# Patient Record
Sex: Female | Born: 1940 | Race: Black or African American | Hispanic: No | Marital: Married | Smoking: Former smoker
Health system: Southern US, Community
[De-identification: ages and names within clinical notes are randomized; demographics above are authoritative.]

## PROBLEM LIST (undated history)

## (undated) DIAGNOSIS — J189 Pneumonia, unspecified organism: Secondary | ICD-10-CM

## (undated) DIAGNOSIS — I5032 Chronic diastolic (congestive) heart failure: Secondary | ICD-10-CM

## (undated) DIAGNOSIS — I10 Essential (primary) hypertension: Secondary | ICD-10-CM

## (undated) DIAGNOSIS — I209 Angina pectoris, unspecified: Secondary | ICD-10-CM

## (undated) DIAGNOSIS — E039 Hypothyroidism, unspecified: Secondary | ICD-10-CM

## (undated) DIAGNOSIS — N186 End stage renal disease: Secondary | ICD-10-CM

## (undated) DIAGNOSIS — E785 Hyperlipidemia, unspecified: Secondary | ICD-10-CM

## (undated) DIAGNOSIS — Z992 Dependence on renal dialysis: Secondary | ICD-10-CM

## (undated) DIAGNOSIS — E079 Disorder of thyroid, unspecified: Secondary | ICD-10-CM

## (undated) DIAGNOSIS — D693 Immune thrombocytopenic purpura: Secondary | ICD-10-CM

## (undated) DIAGNOSIS — J45909 Unspecified asthma, uncomplicated: Secondary | ICD-10-CM

## (undated) DIAGNOSIS — J449 Chronic obstructive pulmonary disease, unspecified: Secondary | ICD-10-CM

## (undated) DIAGNOSIS — R0602 Shortness of breath: Secondary | ICD-10-CM

## (undated) DIAGNOSIS — C801 Malignant (primary) neoplasm, unspecified: Secondary | ICD-10-CM

## (undated) DIAGNOSIS — M1990 Unspecified osteoarthritis, unspecified site: Secondary | ICD-10-CM

## (undated) DIAGNOSIS — K219 Gastro-esophageal reflux disease without esophagitis: Secondary | ICD-10-CM

## (undated) DIAGNOSIS — K5792 Diverticulitis of intestine, part unspecified, without perforation or abscess without bleeding: Secondary | ICD-10-CM

## (undated) HISTORY — DX: Essential (primary) hypertension: I10

## (undated) HISTORY — DX: Hyperlipidemia, unspecified: E78.5

## (undated) HISTORY — PX: COLON SURGERY: SHX602

## (undated) HISTORY — PX: ABDOMINAL HYSTERECTOMY: SHX81

## (undated) HISTORY — DX: Disorder of thyroid, unspecified: E07.9

## (undated) HISTORY — DX: Chronic obstructive pulmonary disease, unspecified: J44.9

## (undated) HISTORY — DX: Immune thrombocytopenic purpura: D69.3

---

## 1998-04-21 ENCOUNTER — Ambulatory Visit (HOSPITAL_COMMUNITY): Admission: RE | Admit: 1998-04-21 | Discharge: 1998-04-21 | Payer: Self-pay | Admitting: Hematology & Oncology

## 1998-05-01 ENCOUNTER — Ambulatory Visit (HOSPITAL_COMMUNITY): Admission: RE | Admit: 1998-05-01 | Discharge: 1998-05-01 | Payer: Self-pay | Admitting: Hematology & Oncology

## 1998-05-13 ENCOUNTER — Ambulatory Visit (HOSPITAL_COMMUNITY): Admission: RE | Admit: 1998-05-13 | Discharge: 1998-05-13 | Payer: Self-pay | Admitting: General Surgery

## 1998-11-12 ENCOUNTER — Ambulatory Visit (HOSPITAL_COMMUNITY): Admission: RE | Admit: 1998-11-12 | Discharge: 1998-11-12 | Payer: Self-pay

## 1999-05-11 ENCOUNTER — Ambulatory Visit (HOSPITAL_COMMUNITY): Admission: RE | Admit: 1999-05-11 | Discharge: 1999-05-11 | Payer: Self-pay

## 1999-05-11 ENCOUNTER — Encounter: Payer: Self-pay | Admitting: Hematology & Oncology

## 2000-08-15 ENCOUNTER — Encounter: Payer: Self-pay | Admitting: Hematology & Oncology

## 2000-08-15 ENCOUNTER — Ambulatory Visit (HOSPITAL_COMMUNITY): Admission: RE | Admit: 2000-08-15 | Discharge: 2000-08-15 | Payer: Self-pay | Admitting: Hematology & Oncology

## 2001-03-21 ENCOUNTER — Emergency Department (HOSPITAL_COMMUNITY): Admission: EM | Admit: 2001-03-21 | Discharge: 2001-03-21 | Payer: Self-pay | Admitting: Emergency Medicine

## 2001-03-29 ENCOUNTER — Encounter (HOSPITAL_COMMUNITY): Admission: RE | Admit: 2001-03-29 | Discharge: 2001-06-27 | Payer: Self-pay | Admitting: *Deleted

## 2001-08-07 ENCOUNTER — Inpatient Hospital Stay (HOSPITAL_COMMUNITY): Admission: AD | Admit: 2001-08-07 | Discharge: 2001-08-14 | Payer: Self-pay | Admitting: Hematology & Oncology

## 2001-08-07 ENCOUNTER — Encounter: Payer: Self-pay | Admitting: Nephrology

## 2001-08-09 ENCOUNTER — Encounter: Payer: Self-pay | Admitting: *Deleted

## 2001-08-10 ENCOUNTER — Encounter: Payer: Self-pay | Admitting: Hematology & Oncology

## 2001-08-11 ENCOUNTER — Encounter: Payer: Self-pay | Admitting: Hematology & Oncology

## 2002-02-21 ENCOUNTER — Ambulatory Visit (HOSPITAL_COMMUNITY): Admission: RE | Admit: 2002-02-21 | Discharge: 2002-02-21 | Payer: Self-pay | Admitting: Vascular Surgery

## 2003-07-12 ENCOUNTER — Encounter: Payer: Self-pay | Admitting: Nephrology

## 2003-07-12 ENCOUNTER — Ambulatory Visit (HOSPITAL_COMMUNITY): Admission: RE | Admit: 2003-07-12 | Discharge: 2003-07-12 | Payer: Self-pay | Admitting: Nephrology

## 2003-07-17 ENCOUNTER — Encounter: Payer: Self-pay | Admitting: Nephrology

## 2003-07-17 ENCOUNTER — Encounter: Admission: RE | Admit: 2003-07-17 | Discharge: 2003-07-17 | Payer: Self-pay | Admitting: Nephrology

## 2003-08-21 ENCOUNTER — Other Ambulatory Visit: Admission: RE | Admit: 2003-08-21 | Discharge: 2003-08-21 | Payer: Self-pay | Admitting: Obstetrics and Gynecology

## 2003-09-11 ENCOUNTER — Ambulatory Visit (HOSPITAL_COMMUNITY): Admission: RE | Admit: 2003-09-11 | Discharge: 2003-09-11 | Payer: Self-pay | Admitting: *Deleted

## 2003-09-11 ENCOUNTER — Encounter (INDEPENDENT_AMBULATORY_CARE_PROVIDER_SITE_OTHER): Payer: Self-pay | Admitting: *Deleted

## 2004-11-06 ENCOUNTER — Ambulatory Visit (HOSPITAL_COMMUNITY): Admission: RE | Admit: 2004-11-06 | Discharge: 2004-11-06 | Payer: Self-pay | Admitting: Nephrology

## 2004-11-09 ENCOUNTER — Ambulatory Visit (HOSPITAL_COMMUNITY): Admission: RE | Admit: 2004-11-09 | Discharge: 2004-11-09 | Payer: Self-pay | Admitting: Nephrology

## 2004-12-26 ENCOUNTER — Ambulatory Visit (HOSPITAL_COMMUNITY): Admission: RE | Admit: 2004-12-26 | Discharge: 2004-12-26 | Payer: Self-pay | Admitting: Nephrology

## 2005-03-05 ENCOUNTER — Ambulatory Visit: Payer: Self-pay | Admitting: Hematology & Oncology

## 2005-07-12 ENCOUNTER — Ambulatory Visit (HOSPITAL_COMMUNITY): Admission: RE | Admit: 2005-07-12 | Discharge: 2005-07-12 | Payer: Self-pay | Admitting: Nephrology

## 2006-01-10 ENCOUNTER — Ambulatory Visit (HOSPITAL_COMMUNITY): Admission: RE | Admit: 2006-01-10 | Discharge: 2006-01-10 | Payer: Self-pay | Admitting: Nephrology

## 2006-03-15 ENCOUNTER — Ambulatory Visit: Payer: Self-pay | Admitting: Hematology & Oncology

## 2007-03-13 ENCOUNTER — Ambulatory Visit: Payer: Self-pay | Admitting: Hematology & Oncology

## 2007-03-15 LAB — CBC & DIFF AND RETIC
BASO%: 0.3 % (ref 0.0–2.0)
EOS%: 2.9 % (ref 0.0–7.0)
IRF: 0.34 — ABNORMAL HIGH (ref 0.130–0.330)
MCH: 34 pg (ref 26.0–34.0)
MCHC: 35.1 g/dL (ref 32.0–36.0)
MONO%: 6.4 % (ref 0.0–13.0)
RBC: 3.09 10*6/uL — ABNORMAL LOW (ref 3.70–5.32)
RDW: 14.6 % — ABNORMAL HIGH (ref 11.3–14.5)
RETIC #: 80 10*3/uL (ref 19.7–115.1)
lymph#: 1 10*3/uL (ref 0.9–3.3)

## 2007-12-14 ENCOUNTER — Encounter (HOSPITAL_COMMUNITY): Admission: RE | Admit: 2007-12-14 | Discharge: 2007-12-15 | Payer: Self-pay | Admitting: Nephrology

## 2007-12-16 ENCOUNTER — Ambulatory Visit: Payer: Self-pay | Admitting: Nephrology

## 2007-12-20 ENCOUNTER — Ambulatory Visit: Payer: Self-pay | Admitting: Nephrology

## 2007-12-25 ENCOUNTER — Ambulatory Visit: Payer: Self-pay | Admitting: Nephrology

## 2007-12-27 ENCOUNTER — Emergency Department: Payer: Self-pay | Admitting: Emergency Medicine

## 2007-12-27 ENCOUNTER — Ambulatory Visit: Payer: Self-pay | Admitting: Emergency Medicine

## 2007-12-27 ENCOUNTER — Inpatient Hospital Stay (HOSPITAL_COMMUNITY): Admission: AD | Admit: 2007-12-27 | Discharge: 2008-01-01 | Payer: Self-pay | Admitting: Nephrology

## 2008-01-11 ENCOUNTER — Ambulatory Visit: Payer: Self-pay

## 2008-01-12 ENCOUNTER — Ambulatory Visit (HOSPITAL_COMMUNITY): Admission: RE | Admit: 2008-01-12 | Discharge: 2008-01-12 | Payer: Self-pay | Admitting: Nephrology

## 2008-02-05 ENCOUNTER — Ambulatory Visit: Payer: Self-pay | Admitting: Nephrology

## 2008-03-11 ENCOUNTER — Ambulatory Visit: Payer: Self-pay | Admitting: Hematology & Oncology

## 2008-03-13 LAB — CBC & DIFF AND RETIC
BASO%: 0.9 % (ref 0.0–2.0)
LYMPH%: 28.4 % (ref 14.0–48.0)
MCHC: 34.5 g/dL (ref 32.0–36.0)
MONO#: 0.4 10*3/uL (ref 0.1–0.9)
Platelets: 262 10*3/uL (ref 145–400)
RBC: 3.8 10*6/uL (ref 3.70–5.32)
RDW: 18.4 % — ABNORMAL HIGH (ref 11.3–14.5)
RETIC #: 93.1 10*3/uL (ref 19.7–115.1)
Retic %: 2.5 % — ABNORMAL HIGH (ref 0.4–2.3)
WBC: 5.9 10*3/uL (ref 3.9–10.0)

## 2008-03-13 LAB — CHCC SMEAR

## 2008-03-14 LAB — LACTATE DEHYDROGENASE: LDH: 198 U/L (ref 94–250)

## 2008-03-14 LAB — DIRECT ANTIGLOBULIN TEST (NOT AT ARMC)
DAT (Complement): NEGATIVE
DAT IgG: NEGATIVE

## 2008-03-14 LAB — FERRITIN: Ferritin: 357 ng/mL — ABNORMAL HIGH (ref 10–291)

## 2008-07-19 ENCOUNTER — Ambulatory Visit (HOSPITAL_COMMUNITY): Admission: RE | Admit: 2008-07-19 | Discharge: 2008-07-19 | Payer: Self-pay | Admitting: Nephrology

## 2008-11-12 ENCOUNTER — Ambulatory Visit (HOSPITAL_COMMUNITY): Admission: RE | Admit: 2008-11-12 | Discharge: 2008-11-12 | Payer: Self-pay | Admitting: Nephrology

## 2009-01-23 ENCOUNTER — Ambulatory Visit (HOSPITAL_COMMUNITY): Admission: RE | Admit: 2009-01-23 | Discharge: 2009-01-23 | Payer: Self-pay | Admitting: Nephrology

## 2009-02-10 ENCOUNTER — Ambulatory Visit (HOSPITAL_COMMUNITY): Admission: RE | Admit: 2009-02-10 | Discharge: 2009-02-10 | Payer: Self-pay | Admitting: Nephrology

## 2009-02-13 ENCOUNTER — Ambulatory Visit: Payer: Self-pay | Admitting: Nephrology

## 2009-03-07 ENCOUNTER — Ambulatory Visit: Payer: Self-pay | Admitting: Nephrology

## 2009-03-17 ENCOUNTER — Ambulatory Visit: Payer: Self-pay | Admitting: Hematology & Oncology

## 2009-03-22 ENCOUNTER — Ambulatory Visit: Payer: Self-pay | Admitting: Nephrology

## 2009-03-28 LAB — CBC WITH DIFFERENTIAL (CANCER CENTER ONLY)
BASO#: 0 10*3/uL (ref 0.0–0.2)
BASO%: 0.4 % (ref 0.0–2.0)
EOS%: 2.7 % (ref 0.0–7.0)
Eosinophils Absolute: 0.1 10*3/uL (ref 0.0–0.5)
LYMPH#: 1.6 10*3/uL (ref 0.9–3.3)
LYMPH%: 35.2 % (ref 14.0–48.0)
MONO#: 0.2 10*3/uL (ref 0.1–0.9)
MONO%: 5 % (ref 0.0–13.0)
NEUT#: 2.5 10*3/uL (ref 1.5–6.5)
NEUT%: 56.7 % (ref 39.6–80.0)
Platelets: 142 10*3/uL — ABNORMAL LOW (ref 145–400)
RBC: 3.5 10*6/uL — ABNORMAL LOW (ref 3.70–5.32)
WBC: 4.5 10*3/uL (ref 3.9–10.0)

## 2009-03-28 LAB — RETICULOCYTES (CHCC)
ABS Retic: 62.1 10*3/uL (ref 19.0–186.0)
RBC.: 3.45 MIL/uL — ABNORMAL LOW (ref 3.87–5.11)
Retic Ct Pct: 1.8 % (ref 0.4–3.1)

## 2009-10-20 ENCOUNTER — Ambulatory Visit (HOSPITAL_COMMUNITY): Admission: RE | Admit: 2009-10-20 | Discharge: 2009-10-20 | Payer: Self-pay | Admitting: Nephrology

## 2009-11-04 ENCOUNTER — Encounter (HOSPITAL_COMMUNITY): Admission: RE | Admit: 2009-11-04 | Discharge: 2009-12-26 | Payer: Self-pay | Admitting: Nephrology

## 2009-11-06 ENCOUNTER — Ambulatory Visit (HOSPITAL_COMMUNITY): Admission: RE | Admit: 2009-11-06 | Discharge: 2009-11-06 | Payer: Self-pay | Admitting: Nephrology

## 2009-11-06 ENCOUNTER — Ambulatory Visit: Payer: Self-pay | Admitting: Nephrology

## 2009-11-08 ENCOUNTER — Ambulatory Visit: Payer: Self-pay

## 2009-11-11 ENCOUNTER — Ambulatory Visit: Payer: Self-pay

## 2009-11-13 ENCOUNTER — Ambulatory Visit: Payer: Self-pay

## 2009-12-05 ENCOUNTER — Ambulatory Visit: Payer: Self-pay | Admitting: Internal Medicine

## 2009-12-12 ENCOUNTER — Ambulatory Visit: Payer: Self-pay | Admitting: Internal Medicine

## 2010-02-27 ENCOUNTER — Inpatient Hospital Stay (HOSPITAL_COMMUNITY): Admission: EM | Admit: 2010-02-27 | Discharge: 2010-03-03 | Payer: Self-pay | Admitting: Emergency Medicine

## 2010-03-02 ENCOUNTER — Encounter (INDEPENDENT_AMBULATORY_CARE_PROVIDER_SITE_OTHER): Payer: Self-pay | Admitting: Internal Medicine

## 2010-03-18 ENCOUNTER — Ambulatory Visit (HOSPITAL_COMMUNITY): Admission: RE | Admit: 2010-03-18 | Discharge: 2010-03-18 | Payer: Self-pay | Admitting: Nephrology

## 2010-04-01 ENCOUNTER — Ambulatory Visit: Payer: Self-pay | Admitting: Hematology & Oncology

## 2010-09-18 ENCOUNTER — Ambulatory Visit: Payer: Self-pay | Admitting: Vascular Surgery

## 2010-09-25 ENCOUNTER — Ambulatory Visit: Payer: Self-pay | Admitting: Vascular Surgery

## 2010-09-28 ENCOUNTER — Ambulatory Visit (HOSPITAL_COMMUNITY): Admission: RE | Admit: 2010-09-28 | Discharge: 2010-09-28 | Payer: Self-pay | Admitting: Vascular Surgery

## 2010-09-28 ENCOUNTER — Ambulatory Visit: Payer: Self-pay | Admitting: Vascular Surgery

## 2010-10-09 ENCOUNTER — Ambulatory Visit: Payer: Self-pay | Admitting: Vascular Surgery

## 2010-10-18 ENCOUNTER — Emergency Department (HOSPITAL_COMMUNITY): Admission: EM | Admit: 2010-10-18 | Discharge: 2010-10-18 | Payer: Self-pay | Admitting: Emergency Medicine

## 2010-10-30 ENCOUNTER — Ambulatory Visit: Payer: Self-pay | Admitting: Vascular Surgery

## 2010-11-04 ENCOUNTER — Ambulatory Visit (HOSPITAL_COMMUNITY): Admission: RE | Admit: 2010-11-04 | Discharge: 2010-11-04 | Payer: Self-pay | Admitting: Vascular Surgery

## 2010-11-04 ENCOUNTER — Ambulatory Visit: Payer: Self-pay | Admitting: Vascular Surgery

## 2010-11-04 HISTORY — PX: AV FISTULA PLACEMENT, BRACHIOCEPHALIC: SHX1207

## 2010-11-16 ENCOUNTER — Inpatient Hospital Stay: Payer: Self-pay | Admitting: Internal Medicine

## 2010-12-04 ENCOUNTER — Ambulatory Visit: Payer: Self-pay | Admitting: Vascular Surgery

## 2010-12-07 ENCOUNTER — Ambulatory Visit (HOSPITAL_COMMUNITY)
Admission: RE | Admit: 2010-12-07 | Discharge: 2010-12-07 | Payer: Self-pay | Source: Home / Self Care | Attending: Vascular Surgery | Admitting: Vascular Surgery

## 2011-01-16 ENCOUNTER — Encounter: Payer: Self-pay | Admitting: Nephrology

## 2011-02-20 NOTE — H&P (Signed)
  [name], BEAUFORT NO.:  [id number]  MEDICAL RECORD NO.:  [id number]           PATIENT TYPE:  LOCATION:                                 FACILITY:  PHYSICIAN:  Fransisco Hertz, MD       DATE OF BIRTH:  [birth year]  DATE OF ADMISSION: DATE OF DISCHARGE:                             HISTORY & PHYSICAL   This patient underwent an operation on September 28, 2010, in the angiographic suite.  There was a note on the chart referencing her previous history and physicals which were clearly available in the chart from September 25, 2010, and also September 18, 2010.  There was no change.  This patient's cardiopulmonary status since those two notes and this was documented in paperwork on this patient's chart, so this should constitute an adequate history and physical in this patient for the procedure performed on September 28, 2010.     Fransisco Hertz, MD     BLC/MEDQ  D:  02/15/2011  T:  02/15/2011  Job:  [id number]  Electronically Signed by Leonides Sake MD on 02/20/2011 04:02:09 PM

## 2011-02-24 ENCOUNTER — Ambulatory Visit (HOSPITAL_COMMUNITY)
Admission: RE | Admit: 2011-02-24 | Discharge: 2011-02-24 | Disposition: A | Discharge status: Home / Self Care | Payer: Medicare Other | Source: Ambulatory Visit | Attending: Surgery | Admitting: Surgery

## 2011-02-24 DIAGNOSIS — N186 End stage renal disease: Secondary | ICD-10-CM | POA: Insufficient documentation

## 2011-02-24 DIAGNOSIS — Y832 Surgical operation with anastomosis, bypass or graft as the cause of abnormal reaction of the patient, or of later complication, without mention of misadventure at the time of the procedure: Secondary | ICD-10-CM | POA: Insufficient documentation

## 2011-02-24 DIAGNOSIS — I120 Hypertensive chronic kidney disease with stage 5 chronic kidney disease or end stage renal disease: Secondary | ICD-10-CM

## 2011-02-24 DIAGNOSIS — T82598A Other mechanical complication of other cardiac and vascular devices and implants, initial encounter: Secondary | ICD-10-CM | POA: Insufficient documentation

## 2011-02-24 DIAGNOSIS — Z01810 Encounter for preprocedural cardiovascular examination: Secondary | ICD-10-CM | POA: Insufficient documentation

## 2011-02-24 DIAGNOSIS — T82898A Other specified complication of vascular prosthetic devices, implants and grafts, initial encounter: Secondary | ICD-10-CM

## 2011-02-24 LAB — POCT I-STAT, CHEM 8
BUN: 24 mg/dL — ABNORMAL HIGH (ref 6–23)
Calcium, Ion: 0.98 mmol/L — ABNORMAL LOW (ref 1.12–1.32)
Chloride: 101 mEq/L (ref 96–112)
Creatinine, Ser: 6.4 mg/dL — ABNORMAL HIGH (ref 0.4–1.2)
Glucose, Bld: 81 mg/dL (ref 70–99)
HCT: 37 % (ref 36.0–46.0)
Hemoglobin: 12.6 g/dL (ref 12.0–15.0)
Potassium: 4.8 mEq/L (ref 3.5–5.1)
Sodium: 138 mEq/L (ref 135–145)
TCO2: 28 mmol/L (ref 0–100)

## 2011-02-28 NOTE — Op Note (Addendum)
[name], [name]                ACCOUNT NO.:  [id number]  MEDICAL RECORD NO.:  [id number]           PATIENT TYPE:  O  LOCATION:  [facility]                         FACILITY:  [facility]  PHYSICIAN:  Juleen China IV, MDDATE OF BIRTH:  Oct 26, 1941  DATE OF PROCEDURE:  02/24/2011 DATE OF DISCHARGE:  02/24/2011                              OPERATIVE REPORT   PREOPERATIVE DIAGNOSIS:  Non-maturing right upper arm fistula.  POSTOPERATIVE DIAGNOSIS:  Non-maturing right upper arm fistula.  PROCEDURE PERFORMED: 1. Ultrasound access, right AV fistula. 2. Right cephalic vein fistulogram. 3. Percutaneous transluminal angioplasty, right cephalic vein. 4. Follow up angiogram x2.  INDICATIONS:  [name] is a 70 year old female with end-stage renal disease who previously had a right upper arm AV fistula, placed by Dr. Imogene Reynolds.  They have a difficulty with cannulation.  Therefore, she comes in today for fistulogram.  She currently dialyzes through a right-sided catheter.  PROCEDURE:  The patient was identified in the holding and taken to room 8, placed supine on the table.  The right arm was prepped and draped in usual fashion.  A time-out was called.  The cephalic vein fistula was evaluated with ultrasound.  It was found to be widely patent at the antecubital crease.  It was easily compressible.  A digital ultrasound image was obtained.  Lidocaine 1% was used for anesthesia.  The cephalic vein was accessed under ultrasound guidance with a micropuncture needle just proximal to the arteriovenous anastomosis.  An 0.018 mandrel wire was then advanced without resistance.  The micropuncture sheath was placed.  Contrast injections were performed through the sheath.  FINDINGS:  The cephalic vein is patent throughout the upper arm.  There is an area of high-grade stenosis in the proximal cephalic vein before it drains into the deeper upper arm venous system.  The axillary and subclavian veins are patent  without significant stenoses.  INTERVENTION:  At this point in time, the decision was made to intervene on the fistula over a Bentson wire.  A 6-French bright tip short sheath was placed.  The patient was given 3000 units of heparin.  I elected to perform primary balloon angioplasty on the cephalic vein at the level of the stenosis.  I used a 4x4 Powerflex balloon and balloon was taken to 12 atmospheres and held out for 2 minutes.  A Followup study was performed which showed improvement, but still residual stenosis.  I therefore elected to upsize to a 5 x 4 Powerflex balloon.  The balloon was taken to 12 atmospheres and held out for 2 minutes.  During insufflation of the balloon, reflux evaluation was used to evaluate the more distal fistula near the arteriovenous anastomosis.  After angioplasty, there was improved flow through the fistula without evidence of stenosis.  I was unable to get adequate reflux across the arteriovenous anastomosis due to a large branch, which was present near the access site.  I did not persist with further evaluation of the arteriovenous anastomosis as I felt that this fistula was not able to be cannulated with the above intervention, that branch ligation or embolization would be the next  step and management of this access.  At this point, the balloon and wire were removed and the sheath was removed with manual pressure for hemostasis.  IMPRESSION: 1. Proximal cephalic vein stenosis, successfully dilated with a 5-mm     balloon. 2. Large branch near the access site, which precluded full evaluation     of the arterial venous anastomosis, should this fistula not be     usable after the above intervention.  RECOMMENDATIONS:  For branch ligation.     Meredith Ny, MD     VWB/MEDQ  D:  02/24/2011  T:  02/25/2011  Job:  [id number]  Electronically Signed by Arelia Longest IV MD on 02/28/2011 07:41:02 PM

## 2011-03-05 IMAGING — CT CT CHEST W/O CM
1 series · 15 of 32 positions shown, 19 images · non-contrast
Comparison: none

REASON FOR EXAM: allergic to shellfish   shortness of breath   office
will pre medicate
COMMENTS:

[Series 2: soft tissue · axial · 0.62mm/px · z∈[-358,-104]mm · 15 of 57 slices shown, 19 images]
[im 4/57  soft-tissue]
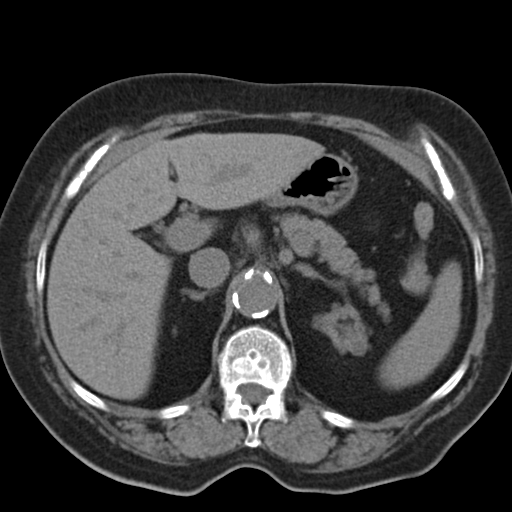
[im 4/57  bone]
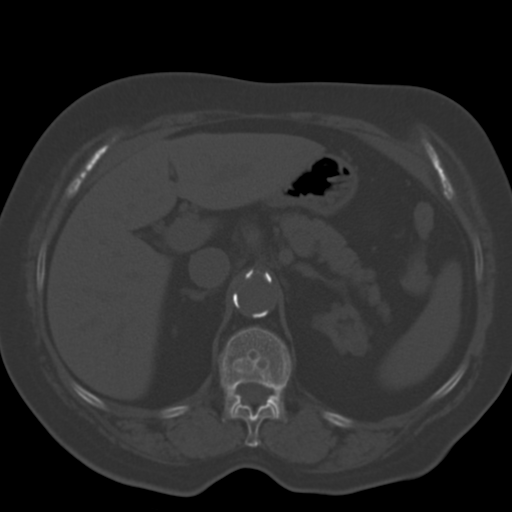
[im 8/57  soft-tissue]
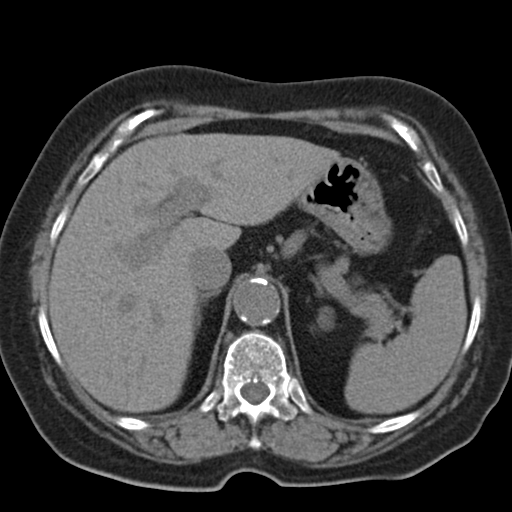
[im 11/57  soft-tissue]
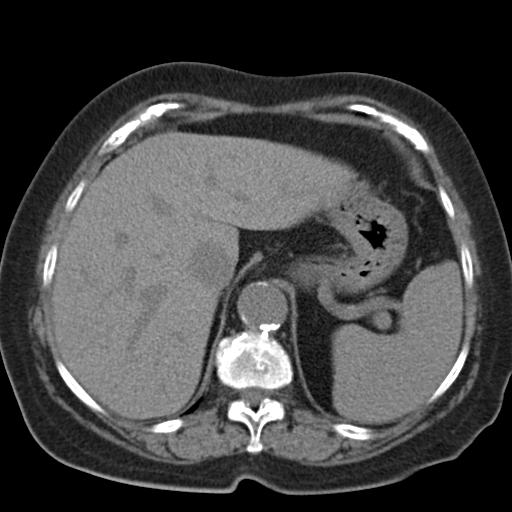
[im 17/57  soft-tissue]
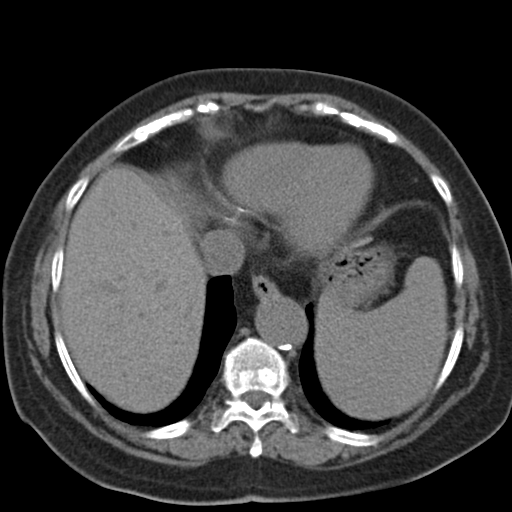
[im 20/57  soft-tissue]
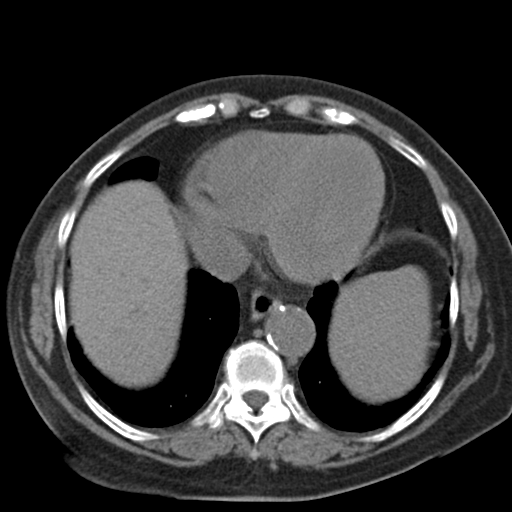
[im 24/57  soft-tissue]
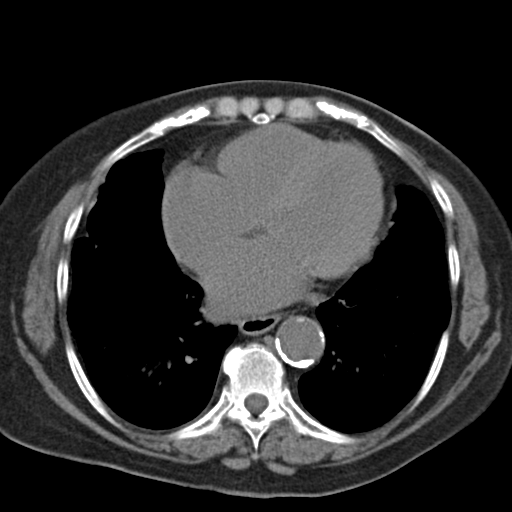
[im 29/57  soft-tissue]
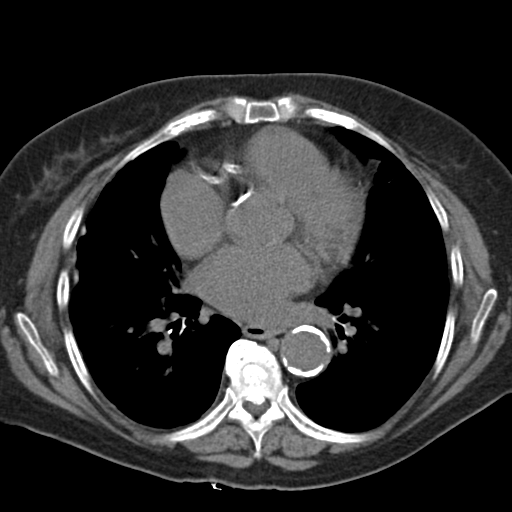
[im 33/57  soft-tissue]
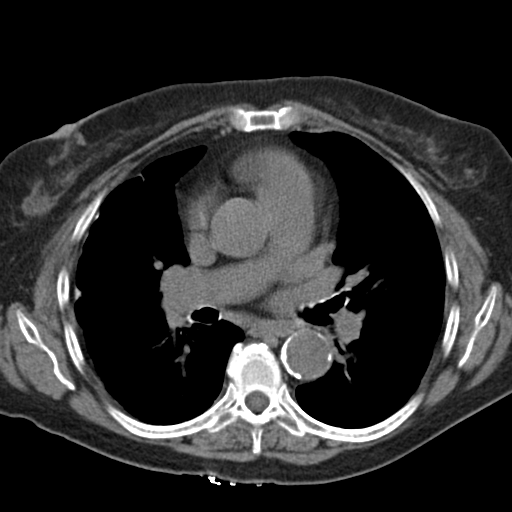
[im 37/57  soft-tissue]
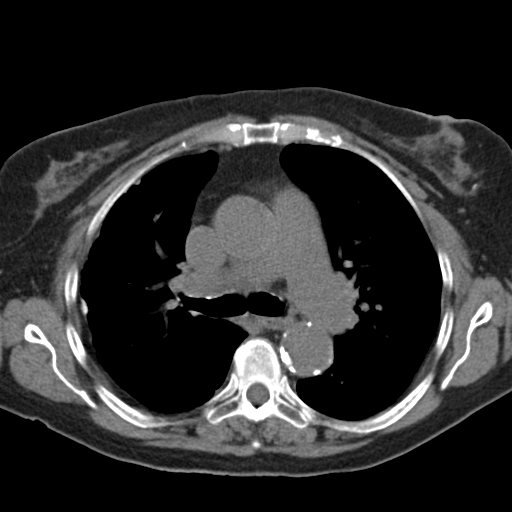
[im 37/57  bone]
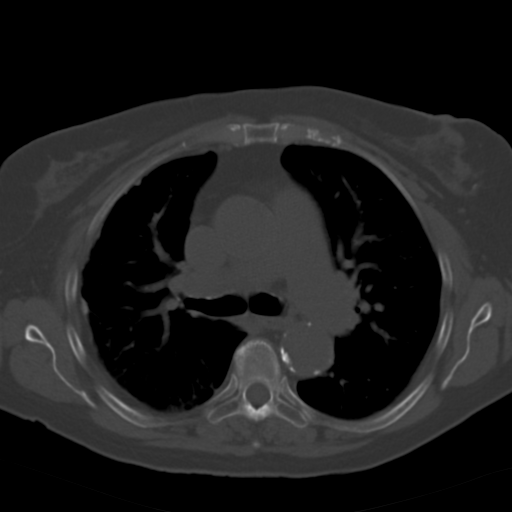
[im 40/57  soft-tissue]
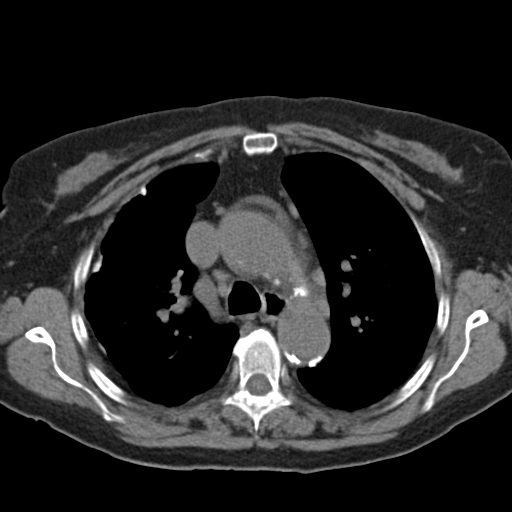
[im 46/57  soft-tissue]
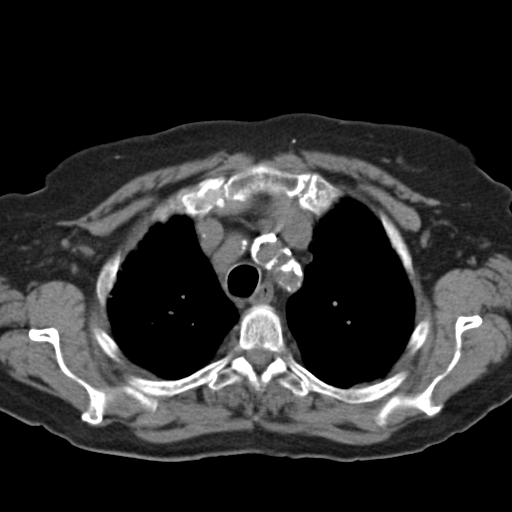
[im 49/57  soft-tissue]
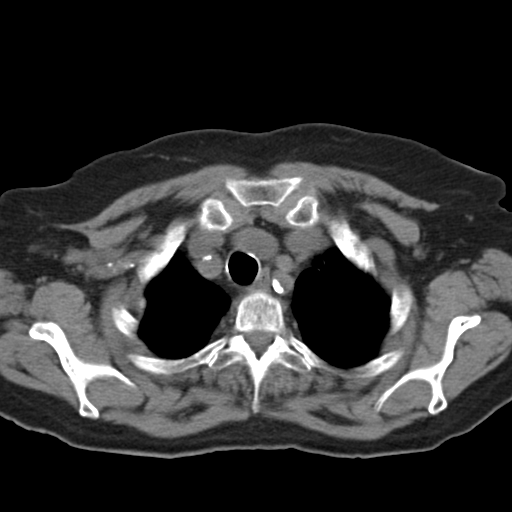
[im 49/57  lung]
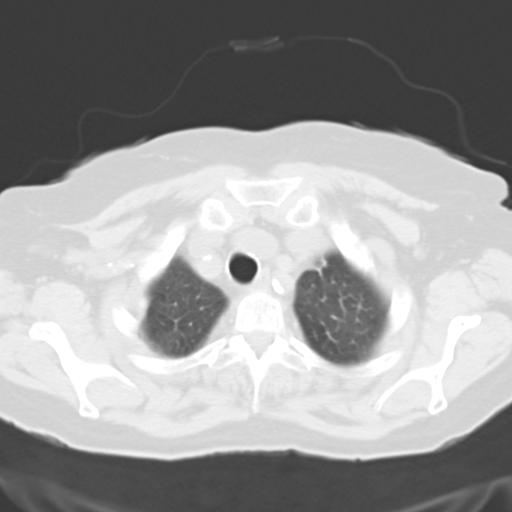
[im 51/57  lung]
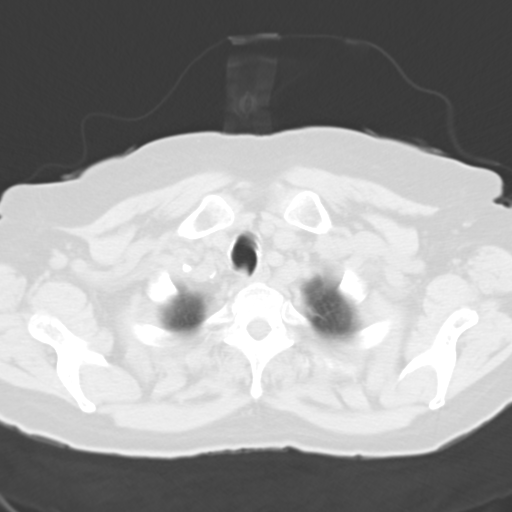
[im 53/57  soft-tissue]
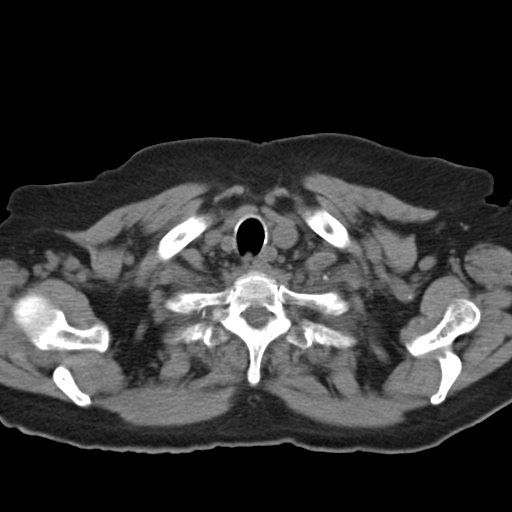
[im 53/57  lung]
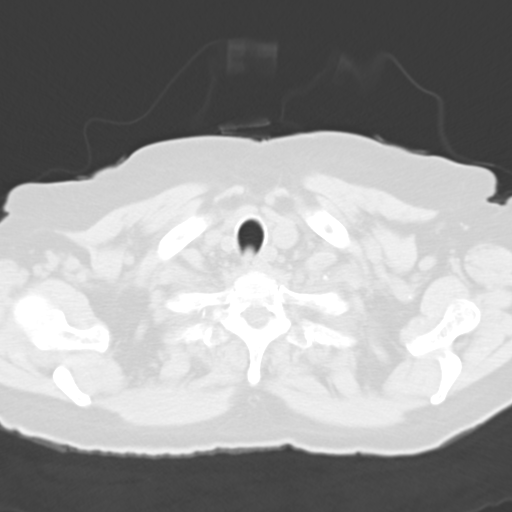
[im 55/57  lung]
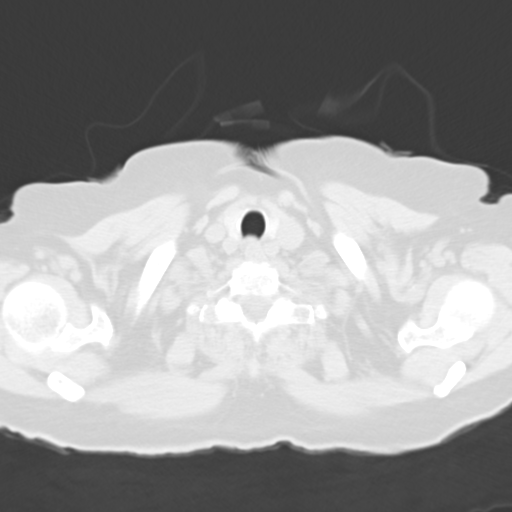

[15 of 32 positions shown; findings below may reference images not displayed]

PROCEDURE:     CT  - CT CHEST WITHOUT CONTRAST  - December 12, 2009 [DATE]

RESULT:     Non-contrast CT of the chest is performed. The patient did not
take her premedication for contrast sensitivity correctly and therefore
contrast was not administered. Comparison is made to previous studies of
02/05/2008 and [DATE].

The lungs show areas of pleural thickening and nodularity with some
calcification predominately in the right hemithorax. There is some
respiratory motion artifact. There are mild emphysematous changes present.
There is no definite infiltrate. The heart is enlarged. Atherosclerotic
calcification is present. Shotty paraaortic and paratracheal lymph nodes are
present in the mediastinum. The left kidney appears small. The right is not
seen. The upper abdominal structures otherwise show some calcification along
the anterior aspect of the spleen.
IMPRESSION: 1.     Chronic changes in the right hemithorax with areas of pleural
thickening and nodularity and calcification. There is improved aeration in
the right upper lobe compared to the previous examination. Emphysematous
changes are present. There is cardiomegaly with atherosclerotic disease.
There is a small left kidney demonstrated.

## 2011-03-08 LAB — SURGICAL PCR SCREEN
MRSA, PCR: NEGATIVE
Staphylococcus aureus: NEGATIVE

## 2011-03-08 LAB — POCT I-STAT 4, (NA,K, GLUC, HGB,HCT)
Glucose, Bld: 83 mg/dL (ref 70–99)
HCT: 35 % — ABNORMAL LOW (ref 36.0–46.0)
Hemoglobin: 11.9 g/dL — ABNORMAL LOW (ref 12.0–15.0)
Potassium: 5.1 mEq/L (ref 3.5–5.1)
Sodium: 138 mEq/L (ref 135–145)

## 2011-03-09 LAB — SURGICAL PCR SCREEN: MRSA, PCR: NEGATIVE

## 2011-03-09 LAB — POCT I-STAT 4, (NA,K, GLUC, HGB,HCT)
Glucose, Bld: 84 mg/dL (ref 70–99)
HCT: 32 % — ABNORMAL LOW (ref 36.0–46.0)
Hemoglobin: 10.9 g/dL — ABNORMAL LOW (ref 12.0–15.0)
Potassium: 4 mEq/L (ref 3.5–5.1)
Sodium: 139 mEq/L (ref 135–145)

## 2011-03-10 LAB — POCT I-STAT, CHEM 8
BUN: 22 mg/dL (ref 6–23)
Calcium, Ion: 1 mmol/L — ABNORMAL LOW (ref 1.12–1.32)
Chloride: 95 mEq/L — ABNORMAL LOW (ref 96–112)
Creatinine, Ser: 7 mg/dL — ABNORMAL HIGH (ref 0.4–1.2)

## 2011-03-19 ENCOUNTER — Ambulatory Visit (INDEPENDENT_AMBULATORY_CARE_PROVIDER_SITE_OTHER): Payer: Medicare Other | Admitting: Vascular Surgery

## 2011-03-19 ENCOUNTER — Encounter (INDEPENDENT_AMBULATORY_CARE_PROVIDER_SITE_OTHER): Payer: Medicare Other

## 2011-03-19 DIAGNOSIS — T82898A Other specified complication of vascular prosthetic devices, implants and grafts, initial encounter: Secondary | ICD-10-CM

## 2011-03-19 DIAGNOSIS — N186 End stage renal disease: Secondary | ICD-10-CM

## 2011-03-19 LAB — RENAL FUNCTION PANEL
BUN: 18 mg/dL (ref 6–23)
CO2: 30 mEq/L (ref 19–32)
CO2: 33 mEq/L — ABNORMAL HIGH (ref 19–32)
Calcium: 12.7 mg/dL — ABNORMAL HIGH (ref 8.4–10.5)
Chloride: 97 mEq/L (ref 96–112)
Creatinine, Ser: 10.39 mg/dL — ABNORMAL HIGH (ref 0.4–1.2)
Glucose, Bld: 105 mg/dL — ABNORMAL HIGH (ref 70–99)
Glucose, Bld: 96 mg/dL (ref 70–99)
Phosphorus: 6.9 mg/dL — ABNORMAL HIGH (ref 2.3–4.6)
Potassium: 4.3 mEq/L (ref 3.5–5.1)
Sodium: 134 mEq/L — ABNORMAL LOW (ref 135–145)

## 2011-03-19 LAB — CBC
HCT: 28.1 % — ABNORMAL LOW (ref 36.0–46.0)
HCT: 36.3 % (ref 36.0–46.0)
Hemoglobin: 11 g/dL — ABNORMAL LOW (ref 12.0–15.0)
Hemoglobin: 12.1 g/dL (ref 12.0–15.0)
Hemoglobin: 9.3 g/dL — ABNORMAL LOW (ref 12.0–15.0)
Hemoglobin: 9.3 g/dL — ABNORMAL LOW (ref 12.0–15.0)
MCHC: 33 g/dL (ref 30.0–36.0)
MCHC: 33.3 g/dL (ref 30.0–36.0)
MCHC: 34 g/dL (ref 30.0–36.0)
MCV: 99.1 fL (ref 78.0–100.0)
Platelets: 154 10*3/uL (ref 150–400)
RBC: 2.82 MIL/uL — ABNORMAL LOW (ref 3.87–5.11)
RBC: 2.94 MIL/uL — ABNORMAL LOW (ref 3.87–5.11)
RBC: 3.64 MIL/uL — ABNORMAL LOW (ref 3.87–5.11)
RDW: 18.7 % — ABNORMAL HIGH (ref 11.5–15.5)
RDW: 19.2 % — ABNORMAL HIGH (ref 11.5–15.5)
RDW: 19.2 % — ABNORMAL HIGH (ref 11.5–15.5)
WBC: 2.8 10*3/uL — ABNORMAL LOW (ref 4.0–10.5)
WBC: 3.5 10*3/uL — ABNORMAL LOW (ref 4.0–10.5)

## 2011-03-19 LAB — DIFFERENTIAL
Basophils Absolute: 0 10*3/uL (ref 0.0–0.1)
Basophils Absolute: 0 10*3/uL (ref 0.0–0.1)
Basophils Relative: 1 % (ref 0–1)
Eosinophils Absolute: 0.2 10*3/uL (ref 0.0–0.7)
Lymphocytes Relative: 16 % (ref 12–46)
Lymphocytes Relative: 27 % (ref 12–46)
Neutro Abs: 1.8 10*3/uL (ref 1.7–7.7)
Neutrophils Relative %: 74 % (ref 43–77)

## 2011-03-19 LAB — POCT I-STAT, CHEM 8
BUN: 12 mg/dL (ref 6–23)
Chloride: 100 mEq/L (ref 96–112)
Sodium: 141 mEq/L (ref 135–145)

## 2011-03-19 LAB — POCT CARDIAC MARKERS
CKMB, poc: <1 ng/mL — ABNORMAL LOW (ref 1.0–8.0)
Myoglobin, poc: 231 ng/mL (ref 12–200)
Troponin i, poc: <0.05 ng/mL (ref 0.00–0.09)

## 2011-03-19 LAB — CARDIAC PANEL(CRET KIN+CKTOT+MB+TROPI)
Relative Index: INVALID (ref 0.0–2.5)
Troponin I: 0.02 ng/mL (ref 0.00–0.06)
Troponin I: 0.04 ng/mL (ref 0.00–0.06)

## 2011-03-19 LAB — CK TOTAL AND CKMB (NOT AT ARMC): Relative Index: INVALID (ref 0.0–2.5)

## 2011-03-19 LAB — BASIC METABOLIC PANEL
CO2: 31 mEq/L (ref 19–32)
GFR calc Af Amer: 6 mL/min — ABNORMAL LOW (ref >60)
Glucose, Bld: 96 mg/dL (ref 70–99)
Potassium: 4.4 mEq/L (ref 3.5–5.1)
Sodium: 133 mEq/L — ABNORMAL LOW (ref 135–145)

## 2011-03-19 LAB — URINALYSIS, ROUTINE W REFLEX MICROSCOPIC
Glucose, UA: 250 mg/dL — AB
Ketones, ur: NEGATIVE mg/dL
Leukocytes, UA: NEGATIVE
pH: 8.5 — ABNORMAL HIGH (ref 5.0–8.0)

## 2011-03-19 LAB — BRAIN NATRIURETIC PEPTIDE
Pro B Natriuretic peptide (BNP): 609 pg/mL — ABNORMAL HIGH (ref 0.0–100.0)
Pro B Natriuretic peptide (BNP): 783 pg/mL — ABNORMAL HIGH (ref 0.0–100.0)

## 2011-03-19 LAB — COMPREHENSIVE METABOLIC PANEL
ALT: 25 U/L (ref 0–35)
CO2: 34 mEq/L — ABNORMAL HIGH (ref 19–32)
Calcium: 9.8 mg/dL (ref 8.4–10.5)
Creatinine, Ser: 5.29 mg/dL — ABNORMAL HIGH (ref 0.4–1.2)
GFR calc non Af Amer: 8 mL/min — ABNORMAL LOW (ref >60)
Glucose, Bld: 92 mg/dL (ref 70–99)

## 2011-03-19 LAB — HEPATITIS B SURFACE ANTIGEN: Hepatitis B Surface Ag: NEGATIVE

## 2011-03-19 LAB — URINE MICROSCOPIC-ADD ON

## 2011-03-19 LAB — TROPONIN I: Troponin I: 0.02 ng/mL (ref 0.00–0.06)

## 2011-03-22 NOTE — Assessment & Plan Note (Signed)
OFFICE VISIT  BAYLEN, DEA DOB:  11-05-1941                                       03/19/2011 BJYNW#:[id number]  HISTORY OF PRESENT ILLNESS:  This is a 70 year old patient I had previously placed a right brachiocephalic arteriovenous fistula. Apparently this fistula was failing to mature.  Dr. Myra Gianotti on the 29th of February took her back to the angio suite and did a fistulogram and then he did an angioplasty on a portion of the cephalic vein.  Also he noted multiple side branches that were siphoning off blood flow.  The patient was sent here today for reevaluation of this access.  They attempted to cannulate it only once without any of success and had some type of extravasation during that attempt,  so they are continuing to dialyze her through her catheter at this point.  She denies any steal symptomatology.  She is able to complete activities of daily living.  PHYSICAL EXAMINATION:  Vitals: blood pressure 158/71, heart rate of 69, respirations were 12. On focused exam the left arm has a patent  brachiocephalic arteriovenous fistula that has a palpable thrill proximally.  Distally it is more pulsatile in nature.  I could also feel like some palpable pulsatile side branches in the upper arm.  Using a Sono-Site I can see a large side branch siphoning off flow in the upper arm.  Noninvasive vascular imaging:  She had a right arm fistula duplex completed.  It demonstrates at least three side branches one of which is quite large, almost equal size and appearance to the fistula.  The fistula itself at the antecubital fossa is only 3.9 mm and at the largest is only about 5 mm which makes it inadequate per KODQI guidelines.  It is less than 6 mm at most locations in depth of which would  make it suitable for cannulation.  MEDICAL DECISION MAKING:  This is a 70 year old female with an inadequately maturing right brachiocephalic arteriovenous fistula. Given  the need for angioplasty in the cephalic vein outflow, I already have concerns about the long-term patency of this access.  I think her only options in salvaging this is to ligate the three side branches identified.  This may increase the blood flow in the fistula enough to avoid clotting of this access and also increase the pressure within it and get the vein to distend further.  My plan is to have her come back next Friday.  Will mark on her arm where the side branches are to facilitate the ligation and she will be posted for the OR on Monday April 2 and at that point, I will ligate the side branches and hopefully this will be able to salvage this fistula.  Otherwise we are going to have to proceed with placement of a new access.    Fransisco Hertz, MD Electronically Signed  BLC/MEDQ  D:  03/19/2011  T:  03/22/2011  Job:  [id number]

## 2011-03-23 NOTE — Procedures (Unsigned)
VASCULAR LAB EXAM  INDICATION:  Followup right upper arm AV fistula placement.  HISTORY: Chronic kidney disease, hypertension.  EXAM:  IMPRESSION: 1. Patent right upper arm arteriovenous fistula with 2 branches seen     coming off of the cephalic vein. 2. Please see the following work sheet for all measurements and     velocities.  ___________________________________________ Fransisco Hertz, MD  EM/MEDQ  D:  03/19/2011  T:  03/19/2011  Job:  [id number]

## 2011-03-26 ENCOUNTER — Encounter (INDEPENDENT_AMBULATORY_CARE_PROVIDER_SITE_OTHER): Payer: Medicare Other

## 2011-03-26 DIAGNOSIS — T82898A Other specified complication of vascular prosthetic devices, implants and grafts, initial encounter: Secondary | ICD-10-CM

## 2011-03-26 DIAGNOSIS — N186 End stage renal disease: Secondary | ICD-10-CM

## 2011-03-29 ENCOUNTER — Ambulatory Visit (HOSPITAL_COMMUNITY)
Admission: RE | Admit: 2011-03-29 | Discharge: 2011-03-29 | Disposition: A | Discharge status: Home / Self Care | Payer: Medicare Other | Source: Ambulatory Visit | Attending: Vascular Surgery | Admitting: Vascular Surgery

## 2011-03-29 DIAGNOSIS — I120 Hypertensive chronic kidney disease with stage 5 chronic kidney disease or end stage renal disease: Secondary | ICD-10-CM | POA: Insufficient documentation

## 2011-03-29 DIAGNOSIS — Z992 Dependence on renal dialysis: Secondary | ICD-10-CM | POA: Insufficient documentation

## 2011-03-29 DIAGNOSIS — J449 Chronic obstructive pulmonary disease, unspecified: Secondary | ICD-10-CM | POA: Insufficient documentation

## 2011-03-29 DIAGNOSIS — J4489 Other specified chronic obstructive pulmonary disease: Secondary | ICD-10-CM | POA: Insufficient documentation

## 2011-03-29 DIAGNOSIS — N186 End stage renal disease: Secondary | ICD-10-CM | POA: Insufficient documentation

## 2011-03-29 LAB — POCT I-STAT 4, (NA,K, GLUC, HGB,HCT)
HCT: 34 % — ABNORMAL LOW (ref 36.0–46.0)
Hemoglobin: 11.6 g/dL — ABNORMAL LOW (ref 12.0–15.0)
Potassium: 4.3 mEq/L (ref 3.5–5.1)
Sodium: 141 mEq/L (ref 135–145)

## 2011-03-29 LAB — SURGICAL PCR SCREEN: Staphylococcus aureus: NEGATIVE

## 2011-03-29 NOTE — Assessment & Plan Note (Signed)
OFFICE VISIT  [name], [name] DOB:  March 15, 1941                                       03/26/2011 [id number]#:[id number]  This is an established patient.  This is a 70 year old female that I had previously seen and was planning on doing a ligation of competing veins in her right arm.  However, when she was evaluated under duplex to map out the branches there was an area of stenosis up to 830 cm/sec which then became a tapered area in the venous outflow tract about 2.9 mm and smaller in the distal.  Based on these findings I felt that this was not compatible with proceeding with just purely a ligation as now to get this fistula to possibly work would require a ligation of competing branches AND then an angioplasty of the venous outflow.  This patient has already undergone one venous angioplasty so unfortunately the vein in my opinion is of inadequate caliber to support long-term dialysis.  I reviewed this patient's previous vein mapping.  On the bilateral arms there is evidence of good basilic vein.  On the left side ranges from 5.1 to 8.5 mm in diameter.  As this is the nondominant arm as the left brachiocephalic arteriovenous fistula has been occluded now for multiple months I think it is safe to go back into this left arm for attempt at a staged basilic vein transposition.  If the vein appears to be of adequate diameter to do a single stage basilic vein transposition I will proceed on Monday.  So we are going to change her procedure from a branch ligation on the right arm to a staged versus single basilic vein transposition on the left side.  I am going to allow this right brachiocephalic arteriovenous fistula to thrombose as I do not think this is going to work in the long-term anyway.    Fransisco Hertz, MD Electronically Signed  BLC/MEDQ  D:  03/26/2011  T:  03/29/2011  Job:  [id number]

## 2011-03-31 LAB — CROSSMATCH

## 2011-04-02 NOTE — Op Note (Signed)
[name], [name]                ACCOUNT NO.:  [id number]  MEDICAL RECORD NO.:  [id number]           PATIENT TYPE:  O  LOCATION:  [facility]                         FACILITY:  [facility]  PHYSICIAN:  Fransisco Hertz, MD       DATE OF BIRTH:  1941/09/24  DATE OF PROCEDURE:  03/29/2011 DATE OF DISCHARGE:  03/29/2011                              OPERATIVE REPORT   PROCEDURE:  Left first stage basilic vein transposition.  PREOPERATIVE DIAGNOSIS:  End-stage renal disease  POSTOPERATIVE DIAGNOSIS:  End-stage renal disease.  SURGEON:  Meredith John L. Imogene Burn, MD  ASSISTANT:  Pecola Leisure, PA  ANESTHESIA:  General.  FINDINGS: a palpable thrill and a dopplerable radial signal at the end of the case.  SPECIMENS:  None.  ESTIMATED BLOOD LOSS:  Minimal.  INDICATIONS:  This is a 70 year old patient who on dialysis that previously had a left arm fistula ligated due to rupture of the pseudoaneurysmal segments.  I then moved on to place a tunneled dialysis catheter and also ligate this left brachiocephalic arteriovenous fistula.  We then placed a right brachiocephalic arteriovenous fistula.  Unfortunately, she has already required angioplasty on the venous outflow and was planning on doing a ligation competing branches; however, on a duplex of the right brachiocephalic arteriovenous fistula and the proximal cephalic vein there was a long segment stenosis where the vein in the order of the mid 2 mm, and given that she was going to require possibly ligation and a venous outflow angioplasty to try to mature this vein a total of three procedures would be needed to even see if this was usable.  I felt that likely this vein was extensively diseased and that attempting to salvage it would be of limited merit.  She has excellent basilic veins in both arms, so I felt that an attempt at basilic vein transposition in her would be a good decision.  She is aware of the risks of this procedure include  bleeding, infection, possible steal syndrome, possible ischemic monomelic neuropathy, possible nerve damage, possible failure to mature and need for additional procedures, especially in a staged basilic vein transposition.  She was aware of these risks and agreed to proceed forward.  DESCRIPTION OF OPERATION:  After full informed written consent was obtained from the patient, she was brought back to the operating room, placed supine upon the operating table.  Prior to induction, she had received IV antibiotics.  After obtaining adequate anesthesia, she was then prepped and draped in a standard fashion for a left arm access procedure.  I turned my attention to her antecubitum.  I had previously marked out the location of her left brachial artery and also her left basilic vein.  I made an incision half way between the two and dissected down using blunt dissection electrocautery down to the artery which I dissected out.  It was a big artery noted to be almost 6 mm in diameter. I placed vessel loops around this artery proximally and distally to gain some control.  Then, I dissected laterally.  In the process, I immediately encountered a large cubital vein and following dissecting  the cubital vein more proximally it entered into the basilic system.  The distal basilic vein was noted to be actually bit smaller than this cubital vein, so I felt that using the cubital vein for the anastomosis would be a better idea in this case.  I tied off the distal basilic vein distal to the confluence of this cubital vein with the basilic system. After tying it off proximally and distally, I then transected this vein and then dissected this cubital vein proximally and distally until I had enough length.  I then clamped slightly onto the forearm.  This cubital vein was transected it, and I tied off the distal cubital vein with a 2-0 tie. Then, I cut through a bifurcation point of the cubital vein to get  a bigger venotomy.  It was noted to be at least externally 4 mm.  I was actually able to pass even a 5-mm metal dilator through this vein. There was decent backbleeding.  I instilled heparinized saline and clamped this with a Serrefine, then I reset my exposure, and placed the brachial  artery under tension proximally and distally.  I had to actually clamp the brachial artery proximally as it was such strong flow.  I at this point made an arteriotomy with a 11 blade, extended this with Potts scissor for a total of about a 4-mm arteriotomy.  The vein was then sewn to this artery in an end-to-side configuration with a running stitch of 6-0 Prolene.  Prior to completing this anastomosis, I allowed the artery to back bleed and both proximally and distally there was good blood flow.  At this point, I completed the anastomosis in the usual fashion after instilling heparinized saline in the anastomosis and then released all clamps and vessel loops.  Immediately, the vein distended and there was a good thrill in the vein.  I interrogated the anastomosis with continuous Doppler proximally and distally, and there was triphasic flow proximally and then at least biphasic distally and there was a dopplerable radial signal, and there was a very strong thrill in the vein and also a turbulent flow on the Doppler consistent with widely patent arteriovenous fistula.  I irrigated out the surgical wound then.  There was no active bleeding.  We reapproximated the subcutaneous tissue with running stitch of 3-0 Vicryl, then the skin was reapproximated with running subcuticular 4-0 Monocryl.  The skin was cleaned, dried, and then reinforced with Dermabond.  The patient tolerated this procedure well, and was allowed to awaken.  COMPLICATIONS:  None.  CONDITION:  Stable.     Fransisco Hertz, MD     BLC/MEDQ  D:  03/29/2011  T:  03/30/2011  Job:  [id number]  Electronically Signed by Leonides Sake MD on  04/02/2011 05:28:31 PM

## 2011-04-05 NOTE — Procedures (Unsigned)
VASCULAR LAB EXAM  INDICATION:  Malfunctioning arteriovenous fistula.  HISTORY: Diabetes:  No. Cardiac:  COPD, CHF. Hypertension:  Yes.  EXAM:  Right hemodialysis duplex arteriovenous fistula.  IMPRESSION: 1. Patent right brachiocephalic fistula present. 2. Three branches identified and marked that arose off the venous     outflow. 3. Elevated velocities present at the level of the shoulder with a     peak systolic velocity of 483 cm/s suggestive of stenosis.  Vessel     diameter changes from 0.47 cm to 0.29 cm are noted.  ___________________________________________ Fransisco Hertz, MD  SH/MEDQ  D:  03/26/2011  T:  03/26/2011  Job:  [id number]

## 2011-04-08 ENCOUNTER — Encounter: Payer: Self-pay | Admitting: Vascular Surgery

## 2011-04-13 LAB — CROSSMATCH: ABO/RH(D): AB POS

## 2011-04-30 ENCOUNTER — Ambulatory Visit (INDEPENDENT_AMBULATORY_CARE_PROVIDER_SITE_OTHER): Payer: Medicare Other | Admitting: Vascular Surgery

## 2011-04-30 DIAGNOSIS — N186 End stage renal disease: Secondary | ICD-10-CM

## 2011-05-03 NOTE — Assessment & Plan Note (Signed)
OFFICE VISIT  SHYNIA, DALEO DOB:  1941-02-21                                       04/30/2011 NGEXB#:[id number]  This is a postop followup.  HISTORY OF PRESENT ILLNESS:  A 70 year old female status post first- stage left basilic vein transposition on March 29, 2011.  She since then has had no steal symptomatology, presents today for routine follow-up, able to complete her activities of daily living.  PHYSICAL EXAMINATION:  Blood pressure is 141/48, heart rate of 67, respirations 12.  On focused examination, the left arm incision is well- healed.  There is a strong thrill at this location.  I interrogated the basilic vein under duplex, and it demonstrates dilation up to 7 mm in the basilic vein.  MEDICAL DECISION MAKING:  This is a 70 year old female status post a left first stage basilic vein transposition.  At this point now is ready for the actual transposition at this fistula.  I will tentatively schedule her for this coming Wednesday.    Fransisco Hertz, MD Electronically Signed  BLC/MEDQ  D:  04/30/2011  T:  05/03/2011  Job:  [id number]

## 2011-05-05 ENCOUNTER — Ambulatory Visit (HOSPITAL_COMMUNITY)
Admission: RE | Admit: 2011-05-05 | Discharge: 2011-05-05 | Disposition: A | Discharge status: Home / Self Care | Payer: Medicare Other | Source: Ambulatory Visit | Attending: Vascular Surgery | Admitting: Vascular Surgery

## 2011-05-05 DIAGNOSIS — I509 Heart failure, unspecified: Secondary | ICD-10-CM | POA: Insufficient documentation

## 2011-05-05 DIAGNOSIS — J4489 Other specified chronic obstructive pulmonary disease: Secondary | ICD-10-CM | POA: Insufficient documentation

## 2011-05-05 DIAGNOSIS — N186 End stage renal disease: Secondary | ICD-10-CM | POA: Insufficient documentation

## 2011-05-05 DIAGNOSIS — Z992 Dependence on renal dialysis: Secondary | ICD-10-CM | POA: Insufficient documentation

## 2011-05-05 DIAGNOSIS — I120 Hypertensive chronic kidney disease with stage 5 chronic kidney disease or end stage renal disease: Secondary | ICD-10-CM | POA: Insufficient documentation

## 2011-05-05 DIAGNOSIS — Z87891 Personal history of nicotine dependence: Secondary | ICD-10-CM | POA: Insufficient documentation

## 2011-05-05 DIAGNOSIS — J449 Chronic obstructive pulmonary disease, unspecified: Secondary | ICD-10-CM | POA: Insufficient documentation

## 2011-05-05 DIAGNOSIS — D649 Anemia, unspecified: Secondary | ICD-10-CM | POA: Insufficient documentation

## 2011-05-05 LAB — POCT I-STAT 4, (NA,K, GLUC, HGB,HCT): Glucose, Bld: 97 mg/dL (ref 70–99)

## 2011-05-05 LAB — SURGICAL PCR SCREEN
MRSA, PCR: NEGATIVE
Staphylococcus aureus: NEGATIVE

## 2011-05-11 NOTE — Procedures (Signed)
VASCULAR LAB EXAM   INDICATION:  Evaluation of AV fistula.   HISTORY:  Left upper extremity AVF placement 9 years prior.   Diabetes:  Yes.  Cardiac:  No.  Hypertension:  Yes.   EXAM:  Left brachial cephalic AVF duplex.   IMPRESSION:  1. The left cephalic vein is tortuous and shows two bulbous sections      in the upper arm measuring 2.75 x 3.14 cm and 2.95 x 3.61 cm.  2. A velocity of 437 cm/sec was noted in the cephalic vein.  3. No branches were seen.  4. The brachial artery was also imaged with the highest velocity of      404 cm/sec noted in the mid upper arm.     ___________________________________________  Leonides Sake, MD   EM/MEDQ  D:  09/25/2010  T:  09/25/2010  Job:  [id number]

## 2011-05-11 NOTE — Procedures (Signed)
CEPHALIC VEIN MAPPING   INDICATION:  End-stage renal disease, complications of existing arterial  venous fistula.   HISTORY:   EXAM:  The right cephalic is compressible.   Diameter measurements range from 0.16 to 0.37 cm.   The right basilic vein is compressible.   Diameter measurements range from 0.49 to 0.82 cm.   The left cephalic vein was not seen from the wrist to the antecubital  fossa.   The left basilic vein is compressible.   Diameter measurements range from 0.51 to 0.85 cm.   See attached worksheet for all measurements.   IMPRESSION:  1. Patent right cephalic and basilic veins with diameter measurements      as described above.  2. Patent left basilic vein with diameter measurements as described      above.  3. The patient has an existing left brachiocephalic Cimino fistula      within the left antecubital fossa, which made this study      technically difficult.   ___________________________________________  Leonides Sake, MD   OD/MEDQ  D:  10/30/2010  T:  10/30/2010  Job:  [id number]

## 2011-05-11 NOTE — Assessment & Plan Note (Signed)
OFFICE VISIT   OLA, RAAP  DOB:  [birth year]                                       10/30/2010  EAVWU#:[id number]   This is an established patient.   HISTORY OF PRESENT ILLNESS:  Is as follows, this is a 70 year old female  with known aneurysmal left brachiocephalic arteriovenous fistula who  presents with chief complaint of followup.  Previously she has been  reluctant to proceed with ligation of her left brachiocephalic fistula  which is in place for 9 years.  She is still able to dialyze through it  but now she is starting to develop some bleeding from the cannulation.  I had previously offered her ligation placement of a new fistula and to  date she has been resistant to such; finally convinced her to do upper  extremity vein mapping to plan out her next access in case she would  develop any complications.  I told her that if she developed any type of  bleeding complications I would recommend abandoning the left  brachiocephalic fistula.  At this point it appears she does not actively  bleed after dialysis is completed and has not had any spontaneous  bleeding from her fistula but while cannulated she bleeds around the  cannulation sites.  No fever or chills.  She had a previous history of  possible infected area on this fistula which had resolved with  antibiotics and spontaneous drainage.  At this point she is amendable to  considering placement of a new fistula.   Her past medical history, surgical history, social history, family  history, review of systems, medications and allergies are all unchanged  from my previously dictated clinic note from 10/14 and also the 09/30  and 09/23.  Also her review of system was unchanged.   PHYSICAL EXAMINATION:  Temperature 98.2, blood pressure 150/78, heart  rate of 99, respirations were 12.  On examination the left  brachiocephalic fistula remains aneurysmal throughout the venous tract.  This is unchanged  from previous.  There are no frank fully eroded  segments that are worrisome.  Her exam is unchanged from previous.  There are no signs of any cellulitis or abscess.  Additionally she has  intact motor in bilateral upper extremities and sensation intact in  bilateral upper extremities.   Vein mapping bilateral upper extremities on the left arm she has a nice  basilic vein ranging from 5 mm up to 8 mm in diameter.  On the right arm  she has a patent cephalic vein at the level on the forearm that is too  small but from the antecubital up ranges from 3 mm up to 3.7 mm.  The  basilic vein on this side also is large at 4.9 mm up to 8.2 mm.   MEDICAL DECISION MAKING:  Given the patient's current use of her  brachiocephalic fistula for dialysis purposes I would not proceed with a  basilic vein transposition on the left, rather I would place a right  basilic brachiocephalic arteriovenous fistula and then after this  matures on the right side I would ligate her left brachiocephalic  arteriovenous fistula.  The patient is amendable to this plan.  We  tentatively are scheduled for 11/04/2010.  I did discuss with her  extensively the risk of any access procedure including nerve injury,  steal syndrome, ischemic monomelic  neuropathy, failure to mature and  need for additional procedures.     Leonides Sake, MD  Electronically Signed   BC/MEDQ  D:  10/30/2010  T:  10/30/2010  Job:  [id number]

## 2011-05-11 NOTE — Assessment & Plan Note (Signed)
OFFICE VISIT   TABRIA, STEINES  DOB:  09-06-1941                                       09/25/2010  ZOXWR#:[id number]   This is a followup visit.  This is a 70 year old patient who presents  with a chief complaint of followup from a left arm arteriovenous fistula  duplex.  The patient has a history consistent with a possible left  brachiocephalic fistula abscess that spontaneously drained and resolved  with antibiotics.  She has no further drainage, no further pain at the  previous site of purulent drainage and has successfully resumed with  dialysis without any difficulties.  No bleeding complications from her  aneurysmal arteriovenous fistula have been noted.  She returns today for  a duplex of her left arteriovenous fistula.   Her past medical history, past surgical history, social history and  family history, review of systems, medications and allergies are  unchanged from her previous visit on 09/18/2010.   On physical examination today she had a blood pressure 176/75, heart  rate of 70, respirations of 20.  On focused exam her left arm the two  previous pseudoaneurysmal areas are without any frank skin compromise.  There is a palpable thrill in the fistula, however, more pulsatile on  examination distally in the venous outflow tract.   Noninvasive vascular studies:  I finalized her left arm fistula duplex.  It demonstrates in the venous outflow in-between the two pseudoaneurysms  a segment where the velocities are elevated up to 437 cm/s.  Also on the  arterial side of his however on the brachial artery mid segment there  appears to be a velocity up to 404 cm/s.  Two areas of pseudoaneurysm  were noted to be 2.75 cm x 3.14 cm and also 2.95 cm x 2.61 cm.   MEDICAL DECISION MAKING:  This is a 70 year old female with an  aneurysmal left brachiocephalic arteriovenous fistula.  Once again I  offered to ligate this patient's fistula, place a tunneled  dialysis  catheter and then place another permanent access.  However, she refuses  at this point.  Given the elevated velocities in the venous outflow I  would be concerned for possible development of a failure of this fistula  due to imminent venous outflow stenosis.  I recommended that we proceed  forward with a fistulogram of the left access.  At this point the  patient notes she has had a total of three other percutaneous  interventions on this left arm.  The problem I discussed with the  patient is with repeat outflow angioplasty there is sequentially  increased scar tissue formation and increased risk for rupture of the  venous outflow with repeated interventions so I explained to her there  was a possibility that despite attempting to improve outflow in this  case we may in fact cause injury to the outflow and result in a  premature thrombosis of this outflow.  She is aware of this and she  still agrees to proceed forward with the procedure.  So tentatively we  will schedule her for this coming Monday October 3 for a left upper arm  fistulogram and possible venoplasty.     Leonides Sake, MD  Electronically Signed   BC/MEDQ  D:  09/25/2010  T:  09/28/2010  Job:  [phone] [phone]

## 2011-05-11 NOTE — Assessment & Plan Note (Signed)
OFFICE VISIT   [name], [name]  DOB:  11-Dec-1941                                       12/04/2010  EAVWU#:[id number]   This is a postoperative followup.   HISTORY:  This is a31 year old patient who is status post a right  brachiocephalic arteriovenous fistula completed on November 04, 2010.  The patient recently was in the ER from an active bleed from her left  aneurysmal brachiocephalic arteriovenous fistula.  Apparently the  fistula clotted and the bleeding stopped.  She had a right internal  jugular tunnel dialysis catheter placed to facilitate dialysis .  At  this point she is not having continued  bleeding complications.  She has  no steal syndrome symptoms and  she is able to complete her activities  of daily living without any difficulty.   PHYSICAL EXAMINATION:  Today she had a  blood pressure 145/74 with a  heart rate of 81.  She has a palpable thrill in the right upper arm with  a bruit on auscultation.  Incision is well-healed.  She has no numbness  to touch.  In the hand, she has good 5/5 hand grip strength.  In her  left arm, there is no longer a bruit or thrill in the upper arm portion  of the cephalic vein.  However proximal to her most distal  pseudoaneurysmal segment she has a strong pulse, approximately 2 cm  proximal to where she bled from.  There is no active bleeding.  She has  healed up all the puncture sites here.   MEDICAL DECISION MAKING:  This is a 70 year old female who is now about  4 weeks status post a right brachiocephalic arteriovenous fistula.  I  interrogated the [facility] fistula with a SonoSite and there are some  segments that I have dilated up to 6 mm.  However, there continues to be  some segments that are only 4 mm.  It is only 4 weeks into the  maturation process of this fistula.  The patient is  continuing to do  her exercises to help increase the rate of maturation.  At this point it  is not ready for cannulation.   I would continue with dialysis with the  tunneled dialysis catheter.  Additionally, her left arm still has a  widely patent arteriovenous fistula up to about 2 cm distal to where she  bled so she is at risk for a new bleed  so I recommended that we proceed  forward with a ligation of her left brachiocephalic arteriovenous  fistula.  This will be done in the OR on Monday,  December 07, 2010.     Leonides Sake, MD  Electronically Signed   BC/MEDQ  D:  12/04/2010  T:  12/04/2010  Job:  [id number]

## 2011-05-11 NOTE — Consult Note (Signed)
[name], [name]                ACCOUNT NO.:  [id number]   MEDICAL RECORD NO.:  [id number]          PATIENT TYPE:  INP   LOCATION:  6705                         FACILITY:  [facility]   PHYSICIAN:  Leslye Peer, MD    DATE OF BIRTH:  [birth year]   DATE OF CONSULTATION:  12/29/2007  DATE OF DISCHARGE:                                 CONSULTATION   REPORT TITLE:  PULMONARY CONSULTATION NOTE   REASON FOR CONSULTATION:  I was asked by Dr. Shawna Orleans to [name]  for abnormal CT scan of chest and hypoxemia.   HISTORY OF PRESENT ILLNESS:  [name] is a 70 year old woman with  chronic kidney disease on hemodialysis which she receives Tuesday,  Thursday and Saturday in [place].  She also has hypertension,  hypothyroidism and a history of autoimmune hemolytic anemia with  associated thrombocytopenia that has been treated in the past  corticosteroids.  She had a spontaneous right-sided pneumothorax  approximately seven years ago which was treated with tube thoracostomy  then subsequently tap pleurodesis.  She reported dyspnea when she was  receiving hemodialysis on December 27, 2007, both when lying flat and  with exertion.  She was sent to the [facility] emergency department  [facility] emergency department for evaluation.  Her chest x-ray was  consistent with reported pulmonary edema, with question of possible  pneumonia.  She received nebulized bronchodilators, corticosteroids and  levofloxacin.  She received hemodialysis and symptomatically improved.  She still has some orthopnea and she complains of longstanding an  intermittent cough that is been productive of clear sputum.  She denies  any fevers, chills, sweats, or other constitutional symptoms.  A CT scan  of the chest was performed at [facility] on December 27, 2007.  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  She also had right-sided apical nodular scarring versus infiltrate which  could have  been consistent with a community-acquired pneumonia versus  old scar.  A CT scan the chest was repeated at [facility] after transfer  to the renal service given her persistent hypoxemia.  This did not show  any evidence of pulmonary embolism but did reveal the apical parenchymal  disease that was seen in [place].  The infiltrate was actually smaller  on the followup study which was performed today.  We are consulted  regarding her abnormal CT and the possibility of tuberculosis.   PAST MEDICAL HISTORY:  1. As detailed above.  2. Hypothyroidism.   CURRENT MEDICATIONS:  1. PhosLo 4 tablets p.o. t.i.d.  2. Clonidine 0.1 mg t.i.d.  3. Norvasc 10 mg q.h.s.  4. Synthroid 125 mcg daily.  5. Metoprolol 50 mg b.i.d.  6. Imdur 60 mg daily.  7. Nephro-Vite one daily.  8. Folic acid 0.4 mcg daily   ALLERGIES:  NO KNOWN DRUG ALLERGIES.   SOCIAL HISTORY:  The patient lives at home.  She is married.  She is a  former tobacco user with approximately 10-15 pack-year total history.  She quit 17 years ago.  She does not use alcohol.  She has never been  exposed to tuberculosis to her knowledge.  She has never had a positive  PPD.  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  She is never been exposed to asbestos or worked in a mill.   FAMILY HISTORY:  Significant for end-stage renal disease.  There are no  known family members who had tuberculosis.   REVIEW OF SYSTEMS:  As per the HPI.   PHYSICAL EXAMINATION:  GENERAL:  This is a pleasant, well-appearing  woman in no distress on room air.  VITAL SIGNS:  Temperature 97.8, blood pressure 154/90, heart rate 75,  respiratory rate 20, SPO2 96% on room air.  HEENT:  Exam is benign.  NECK:  Supple without lymphadenopathy or stridor.  LUNGS:  Lungs are significant for some mild bilateral end-expiratory  wheezes.  There are no crackles.  There is no dullness to percussion.  HEART:  Regular with a prominent S2, but no  murmur.  ABDOMEN:  Her abdomen is obese, soft and nontender with positive bowel  sounds.  EXTREMITIES:  Some mild pretibial edema, otherwise  unremarkable.  NEUROLOGIC:  She has a nonfocal exam.   LABORATORY EVALUATION:  1. CBC:  White blood cell count 5.1, hematocrit 20.9, platelets      286,000.  Sodium 139, potassium 2.8, chloride 93, CO2 29, BUN 41,      creatinine 5.89, glucose 91.  LFTs were normal.  Calcium 10,      phosphorus 2.6.  2. The patient's CT scan of the chest is as detailed above.   IMPRESSION:  A pleasant, 70 year old woman with a history of tobacco use  and a previous right-sided spontaneous pneumothorax, status post tap  pleurodesis.  She presents with an abnormal CT scan of the chest and an  apical right-sided infiltrate versus scar that was present on December  31 and which is only slightly improved on December 29, 2007.  Etiology of  this abnormality is not clear.  There are several possibilities,  including possible recently acquired pneumonia for which she has been  adequately treated.  I am more suspicious that this actually represents  chronic changes, possibly from her tap pleurodesis.  While tuberculosis  or another soft tissue density are possible, I feel that these are quite  unlikely.  I believe that for completeness sake it will be appropriate  to place a PPD to ensure that this is negative.  I also believe that we  could obtain sputum for AFB without needing to place her on respiratory  isolation as she is not coughing up any sputum.  If her PPD is negative  and her AFBs are in are also negative, then I believe she can be  followed with serial CT scans of the chest.  If her abnormality changes  or she develops symptoms, then certainly fiberoptic bronchoscopy will be  an option to more aggressively CT diagnosis.      Leslye Peer, MD  Electronically Signed     RSB/MEDQ  D:  12/29/2007  T:  12/29/2007  Job:  [id number]

## 2011-05-11 NOTE — Assessment & Plan Note (Signed)
OFFICE VISIT   BATINA, DOUGAN  DOB:  03/27/1941                                       09/18/2010  ZOXWR#:[id number]   HISTORY OF PRESENT ILLNESS:  This is a 70 year old female who presents  with a chief complaint of possible infected left arteriovenous fistula.  By report the patient recently was cannulated in the left arteriovenous  fistula which upon review of the chart turns out to be a left  brachiocephalic arteriovenous fistula placed in 2002.  Her dialysis  center has been attempting to produce buttonhole cannulation sites in  her left arteriovenous fistula and recently developed an area of  infection with frank purulence by description per the patient.  The  exact time course of this event, the patient is not able to fully  delineate.  Reviewing some of the outside paperwork it looks minimally  there was concern for an infection in the arteriovenous fistula starting  on September 8.  Since then the patient notes what sounds like an  abscess that spontaneously drained and she was treated with IV  antibiotics during her dialysis runs and then doxycycline by mouth  otherwise.  She is to continue with her IV antibiotics until tomorrow  September 24.  At this point she notes no fevers, no further drainage  and the site where the abscess was has closed off.  She notes no  bleeding complications from her arteriovenous fistula which already she  notes has been aneurysmal for a few years, the exact time frame she is  not able to fully elicit.   PAST MEDICAL HISTORY:  #1. Hypertension.  #2. Hyperlipidemia.  #3. Hypothyroidism.  #4. End-stage renal disease requiring hemodialysis on Tuesdays,  Thursdays and Saturdays.  #5. History of autoimmune hemolytic anemia thrombocytopenia.  #6. COPD.   PAST SURGICAL HISTORY:  She had the left brachiocephalic arteriovenous  fistula placed on 08/11/2001 and she has also had a left forearm  arteriovenous graft placed.   She has had repeat angioplasties of the  venous outflow and central veins in 2007 and 2009.   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  Denies any alcohol or illicit drug use.   FAMILY HISTORY:  Her mother died in her 76s due to heart disease.  Her  father had cancer and one of her brothers has kidney failure also.   MEDICATIONS:  Included Synthroid, renanella (sic), PhosLo, Sensipar and  over-the-counter stool softener.   ALLERGIES:  shellfish. no known drug allergies.   REVIEW OF SYSTEMS:  Was noted for bronchitis, asthma, change in  eyesight, shortness of breath when lying flat and reflux.  The rest of  her 12 point review of systems noted to be negative.   PHYSICAL EXAMINATION:  Vital signs:  She has a temperature of 97.9,  blood pressure 136/80 with heart rate of 79, respirations were 12.   General:  She was well-developed, well-nourished, no apparent distress,  alert and oriented x3.   HEENT:  Normocephalic, atraumatic.  Hearing was grossly intact.  Nares  without any erythema or drainage.  Oropharynx without any erythema or  exudate.  The pupils were equal, round, reactive to light.  Extraocular  movements were intact.  Neck:  Supple.  No nuchal rigidity.  No palpable  lymphadenopathy.   Pulmonary:  Symmetric expansion and good air movement.  Clear to  auscultation bilaterally.  I did not appreciate any rales, rhonchi or  wheezing.   Cardiac:  Regular rate and rhythm.  Normal S1-S2.  I was able to palpate  radial pulses bilaterally and brachial pulses and carotid pulses of  which there were no bruits.  I was not able to palpate her aorta.  Femorals were palpable but her popliteal and pedal pulses were not  palpable on my exam.   GI:  Soft, obese, nontender, nondistended.  No guarding or rebound.  No  splenomegaly.  No obvious masses.  There was no costovertebral angle  tenderness.   Musculoskeletal:  All  extremities were 5/5 strength. Specifically in her  left arm, intrinsic and extrinsic muscle strength in the hand was intact  with 5/5 hand grip.  There were no obvious ulcerations or gangrene in  any extremity.  There was two obvious pseudoaneurysms in the mid portion  of this brachiocephalic fistula and there appears to be two areas which  the patient identified as her current button holes.  The area of former  pus drainage is now a small area of blanching erythema without any  fluctuance here.  There is no obvious imminent area of rupture along the  entire fistula.   Neurological:  Cranial nerves II-XII were intact.  Her sensation was  intact in all extremities and her motor exam was as noted above.   Psychiatric:  Judgment appears to be intact.  Her mood and affect were  appropriate for clinical situation.   Skin:  The musculoskeletal exam lists the extremities.  Otherwise I  noticed no rashes otherwise.   Lymphatic:  There was no cervical, axillary or inguinal lymphadenopathy.   MEDICAL DECISION MAKING:  This is a 70 year old female with a history  that is consistent with an infected area in her left brachiocephalic  fistula.  At this point with antibiotics and spontaneous drainage of the  abscess the patient has only residual amount of blanching erythema where  the previous site of infection is.  There is no advantage at this point  to try and attempt to I and D what essentially is a completely  decompressed abscess and there is minimal amount of residual cellulitis  here.  I would just complete her antibiotic course.  I discussed with  the patient her management in terms of her access.  The whole venous  outflow is aneurysmal with two areas of pseudoaneurysmal change.  I  recommended given the previous history she has had requiring a couple of  percutaneous interventions on the outflow that she undergo a left access  duplex to evaluate the velocities to see if any primary  assisted patency  procedures need to be completed.  If at any point this patient develops  any bleeding complications with this left arm access my plan would be to  either plicate the two areas of pseudoaneurysmal change in this  brachiocephalic fistula or ligate it depending on the clinical  situation.  I offered the patient ligation of her fistula and then  placement of a new one, but she did not want to proceed forward with  this plan.  So we will obtain the duplex studies on this left  brachiocephalic fistula and then she will follow up and make our next  decision on how we should proceed.     Leonides Sake, MD  Electronically Signed   BC/MEDQ  D:  09/18/2010  T:  09/19/2010  Job:  [id number]

## 2011-05-11 NOTE — H&P (Signed)
[name], [name]                ACCOUNT NO.:  [id number]   MEDICAL RECORD NO.:  [id number]          PATIENT TYPE:  INP   LOCATION:  6799                         FACILITY:  [facility]   PHYSICIAN:  Cecille Aver, M.D.DATE OF BIRTH:  1941-09-20   DATE OF ADMISSION:  12/27/2007  DATE OF DISCHARGE:                              HISTORY & PHYSICAL   HISTORY OF PRESENT ILLNESS:  [name] is a 70 year old black female  with a past medical history significant for end-stage renal disease on  dialysis Tuesday, Thursday, Saturday at the [facility],  hypertension, hypothyroidism, and also a history of autoimmune hemolytic  anemia and thrombocytopenia previously on steroids but now with p.r.n.  transfusions, the last one 2 weeks ago.  The patient, through reviewing  the records, has needed to have her dry weight decreased for a little  time now.  She is resistant to this, so it has not been done.  Today  while she was in dialysis, she reported shortness of breath and dyspnea  on exertion.  She did complete her dialysis and got to her dry weight  but was still short of breath, so she was sent to the [facility] Emergency  Department for evaluation and treatment.  Chest x-ray was consistent  with pulmonary edema plus or minus pneumonia.  She was treated with Solu-  Medrol, breathing treatment as well as antihypertensives and Levaquin.  She also had a CT scan that was only remarkable for small pleural  effusions in the chest.  The patient therefore was transported here for  further evaluation and treatment.  She states that she feels better  since this all started.  She denies fever.  She did have a productive  cough but said it was clear.  She is status post an antibiotic course  given through the kidney center of amoxicillin.   PAST MEDICAL HISTORY:  1. End-stage renal disease as above.  2. Hypertension.  3. Hypothyroidism.  4. History of autoimmune hemolytic anemia and  thrombocytopenia.   CURRENT MEDICATIONS:  1. PhosLo 4 tablets p.o. t.i.d. with meals.  2. Clonidine 0.1 mg t.i.d.  3. Norvasc 10 mg nightly.  4. Synthroid 125 mcg daily.  5. Metoprolol 50 mg b.i.d.  6. Imdur 60 mg a day.  7. Nephro-Vite, 1 a day.  8. Folic acid 0.4 mcg daily.   ALLERGIES:  No known drug allergies.   SOCIAL HISTORY:  The patient lives at home.  She is married.  She denies  any tobacco or alcohol use.   FAMILY HISTORY:  Brother had end-stage renal disease.   REVIEW OF SYSTEMS:  Positive for shortness of breath, dyspnea on  exertion, a cough productive of clear sputum and weakness.  Negative for  fevers, chills, night sweats, headache, nausea, vomiting, abdominal pain  or chest pain.  The remainder of the review of systems is negative.   PHYSICAL EXAMINATION:  VITAL SIGNS:  Temperature is afebrile, blood  pressure 160/70, heart rate 77.  Respirations are normal.  Oxygen  saturation is 95% on nasal cannula.  GENERAL:  The patient is alert,  oriented and in no acute distress.  HEENT:  Pupils are equal, round, and reactive to light.  Extraocular  motions are intact.  Mucous membranes are moist.  NECK:  There is jugular venous distention.  LUNGS:  Reveal crackles bilaterally.  CARDIOVASCULAR:  Regular rate and rhythm without murmur, gallop or rub.  ABDOMEN:  Soft and nontender.  EXTREMITIES:  Reveal left upper AV fistula with a good thrill and bruit.  Extremities do reveal trace to 1+ edema.   LABORATORIES:  White blood count 5.1, hemoglobin 8.7.  Potassium 3.1,  BUN and creatinine 10 and 3.22.  BNP is 23,000.  ABG reveals a pO2 of 63  on nasal cannula O2.   ASSESSMENT:  A 70 year old black female with end-stage renal disease and  shortness of breath with chest x-ray consistent with pulmonary edema.  1. Shortness of breath.  I think this can all be explained by      pulmonary edema as the patient has not been getting her dry weight      decreased appropriately at  outpatient dialysis.  However, most of      this is at her insistence.  We will do dialysis tonight with      maximum ultrafiltration and decreased dry weight.  The patient      without productive cough does not have a white blood count or      fevers so I do not really think she has pneumonia at this time.  I      will not give further antibiotics.  2. Blood pressure:  Blood pressure is actually stable for her.  She is      status post antihypertensives at [facility] and was also on      a nitro drip.  We will stop the nitro drip and continue her home      antihypertensives but with orders to hold them if her blood      pressure gets less than a systolic of 130.  Blood pressure      medications actually may be able to be weaned a little bit if we do      get her to an appropriate dry weight.  3. Hyperphosphatemia.  We will continue PhosLo.   DISPOSITION:  Possible discharge in the a.m. if she is not requiring  oxygen and is able to ambulate.           ______________________________  Cecille Aver, M.D.     KAG/MEDQ  D:  12/27/2007  T:  12/27/2007  Job:  [id number]

## 2011-05-11 NOTE — Assessment & Plan Note (Signed)
OFFICE VISIT   CASANDRA, DALLAIRE  DOB:  [birth year]                                       10/09/2010  ZOXWR#:[id number]   HISTORY OF PRESENT ILLNESS:  This is a 70 year old female who recently  underwent a left arteriovenous fistulogram due to Doppler ultrasound  imaging concerning for an intra-fistula stenosis.  However, the  fistulogram demonstrated no such stenosis and widely patent fistula with  distal cephalic stenosis about 50% that was not hemodynamically  significant.   The patient presents today for chief complaint of evaluation for new  access.  At this point, she is still successfully undergoing dialysis by  report without any bleeding complications at the cannulation sites.  At  this point the patient still does not want ligation or any type of  modification of this left brachiocephalic fistula.  There are no steal  symptoms and once again no further episodes of any infection or drainage  from this left brachiocephalic fistula.   Past medical history, past surgical history, social history, family  history, review of systems, medications, allergies are completely  unchanged from her previous clinic chart visit on September 18, 2010.   PHYSICAL EXAMINATION:  Today, she has a temperature 98.1, blood pressure  163/68, heart rate of 75, respirations were 12.  General:  Well developed, well nourished, no obvious distress.  Pulmonary:  Clear auscultation bilaterally.  Cardiac:  Regular rate and rhythm.  Normal S1 and S2.  Abdomen:  Soft, nontender, nondistended.  She had good upper extremity  strength 5/5 in bilateral upper extremity,  intact intrinsic and  extrinsic motor strength.  HEENT:  Pupils were equal, round, react to light.  Extraocular movements  were intact.  Neck was supple, there was no obvious JVD.  Neurologic:  There was no focal weakness or paresthesias.  The hands had  intact sensation.  Skin:  The left brachiocephalic fistula is  unchanged from previous.  She  has healed up the previous fistulogram cannulation site.  The 2 previous  button holes there are present in this patient's brachiocephalic fistula  are still present.  There are no signs of any erosions in the skin.  The  two pseudoaneurysmal areas are unchanged.  Psych: judgment in tact, mood and affect appropriate for her clinical  situation  Lymphatics:  There is no cervical, axillary, or inguinal  lymphadenopathy.   MEDICAL DECISION MAKING:  This is a 70 year old patient status post a  left arm fistulogram which demonstrated no stenosis within the fistula  itself but at the proximal cephalic vein it demonstrates 11% stenosis.  This ultimately will result in this arteriovenous fistula clotting.  There is no advantage in this case to necessarily angioplasty to this  vein as my plan is to allow this fistula to fail once that venous  stenosis progresses as this access is already aneurysmal.  At this point  the patient still does not want ligation of this fistula and does not  want placement of new access.  After some discussion I was able to get  her to consider vein mapping to see what her next options would be. So,  at this point we will arrange for her bilateral upper extremity vein  mapping over the next of 2-3 weeks and she will follow up with me for  consideration of placement of a new fistula.  Note at this point, she  has been seen on 3 different occasions and at all times has not wanted a  new fistula or graft placed.  Based on the vein mapping findings, will  be able to discuss in addition what her options are.  She agrees to  proceed forth with this plan.     Leonides Sake, MD  Electronically Signed   BC/MEDQ  D:  10/09/2010  T:  10/12/2010  Job:  [id number]

## 2011-05-12 ENCOUNTER — Observation Stay (HOSPITAL_COMMUNITY)
Admission: EM | Admit: 2011-05-12 | Discharge: 2011-05-14 | DRG: 069 | Disposition: A | Discharge status: Home / Self Care | Payer: Medicare Other | Attending: Internal Medicine | Admitting: Internal Medicine

## 2011-05-12 ENCOUNTER — Emergency Department (HOSPITAL_COMMUNITY): Payer: Medicare Other

## 2011-05-12 DIAGNOSIS — R0789 Other chest pain: Secondary | ICD-10-CM | POA: Diagnosis present

## 2011-05-12 DIAGNOSIS — N2581 Secondary hyperparathyroidism of renal origin: Secondary | ICD-10-CM | POA: Diagnosis present

## 2011-05-12 DIAGNOSIS — G459 Transient cerebral ischemic attack, unspecified: Principal | ICD-10-CM | POA: Diagnosis present

## 2011-05-12 DIAGNOSIS — N186 End stage renal disease: Secondary | ICD-10-CM | POA: Diagnosis present

## 2011-05-12 DIAGNOSIS — I120 Hypertensive chronic kidney disease with stage 5 chronic kidney disease or end stage renal disease: Secondary | ICD-10-CM | POA: Diagnosis present

## 2011-05-12 DIAGNOSIS — G9389 Other specified disorders of brain: Secondary | ICD-10-CM | POA: Insufficient documentation

## 2011-05-12 DIAGNOSIS — J449 Chronic obstructive pulmonary disease, unspecified: Secondary | ICD-10-CM | POA: Diagnosis present

## 2011-05-12 DIAGNOSIS — D62 Acute posthemorrhagic anemia: Secondary | ICD-10-CM | POA: Diagnosis present

## 2011-05-12 DIAGNOSIS — Z87891 Personal history of nicotine dependence: Secondary | ICD-10-CM

## 2011-05-12 DIAGNOSIS — Z992 Dependence on renal dialysis: Secondary | ICD-10-CM

## 2011-05-12 DIAGNOSIS — R4789 Other speech disturbances: Secondary | ICD-10-CM | POA: Diagnosis present

## 2011-05-12 DIAGNOSIS — J4489 Other specified chronic obstructive pulmonary disease: Secondary | ICD-10-CM | POA: Diagnosis present

## 2011-05-12 LAB — DIFFERENTIAL
Basophils Absolute: 0 10*3/uL (ref 0.0–0.1)
Eosinophils Relative: 2 % (ref 0–5)
Lymphocytes Relative: 16 % (ref 12–46)
Neutro Abs: 4.1 10*3/uL (ref 1.7–7.7)
Neutrophils Relative %: 76 % (ref 43–77)

## 2011-05-12 LAB — BASIC METABOLIC PANEL
BUN: 23 mg/dL (ref 6–23)
Creatinine, Ser: 7.06 mg/dL — ABNORMAL HIGH (ref 0.4–1.2)
GFR calc non Af Amer: 6 mL/min — ABNORMAL LOW (ref >60)
Potassium: 3.6 mEq/L (ref 3.5–5.1)

## 2011-05-12 LAB — CBC
HCT: 23.3 % — ABNORMAL LOW (ref 36.0–46.0)
RDW: 14.9 % (ref 11.5–15.5)
WBC: 5.3 10*3/uL (ref 4.0–10.5)

## 2011-05-12 LAB — SEDIMENTATION RATE: Sed Rate: 70 mm/hr — ABNORMAL HIGH (ref 0–22)

## 2011-05-12 LAB — PROTIME-INR: INR: 1.13 (ref 0.00–1.49)

## 2011-05-12 LAB — APTT: aPTT: 39 seconds — ABNORMAL HIGH (ref 24–37)

## 2011-05-13 ENCOUNTER — Inpatient Hospital Stay (HOSPITAL_COMMUNITY): Payer: Medicare Other

## 2011-05-13 DIAGNOSIS — I517 Cardiomegaly: Secondary | ICD-10-CM

## 2011-05-13 LAB — CK TOTAL AND CKMB (NOT AT ARMC): CK, MB: 0.9 ng/mL (ref 0.3–4.0)

## 2011-05-13 LAB — COMPREHENSIVE METABOLIC PANEL
ALT: <5 U/L (ref 0–35)
Alkaline Phosphatase: 52 U/L (ref 39–117)
BUN: 26 mg/dL — ABNORMAL HIGH (ref 6–23)
Chloride: 96 mEq/L (ref 96–112)
Glucose, Bld: 91 mg/dL (ref 70–99)
Potassium: 3.7 mEq/L (ref 3.5–5.1)
Sodium: 140 mEq/L (ref 135–145)
Total Bilirubin: 0.4 mg/dL (ref 0.3–1.2)
Total Protein: 6 g/dL (ref 6.0–8.3)

## 2011-05-13 LAB — IRON AND TIBC
Iron: 47 ug/dL (ref 42–135)
UIBC: 129 ug/dL

## 2011-05-13 LAB — LIPID PANEL
Cholesterol: 140 mg/dL (ref 0–200)
LDL Cholesterol: 81 mg/dL (ref 0–99)
Total CHOL/HDL Ratio: 3.9 RATIO
VLDL: 23 mg/dL (ref 0–40)

## 2011-05-13 LAB — PHOSPHORUS: Phosphorus: 5.1 mg/dL — ABNORMAL HIGH (ref 2.3–4.6)

## 2011-05-13 LAB — CBC
HCT: 22.1 % — ABNORMAL LOW (ref 36.0–46.0)
Hemoglobin: 7.5 g/dL — ABNORMAL LOW (ref 12.0–15.0)
MCH: 32.5 pg (ref 26.0–34.0)
MCHC: 33.9 g/dL (ref 30.0–36.0)
MCV: 95.7 fL (ref 78.0–100.0)

## 2011-05-13 LAB — HEMOGLOBIN A1C: Hgb A1c MFr Bld: 4.7 % (ref <5.7)

## 2011-05-13 LAB — FERRITIN: Ferritin: 788 ng/mL — ABNORMAL HIGH (ref 10–291)

## 2011-05-13 LAB — TECHNOLOGIST SMEAR REVIEW

## 2011-05-13 NOTE — H&P (Signed)
[name], [name]                ACCOUNT NO.:  [id number]  MEDICAL RECORD NO.:  [id number]           PATIENT TYPE:  E  LOCATION:  [facility]                         FACILITY:  [facility]  PHYSICIAN:  Eduard Clos, MDDATE OF BIRTH:  Mar 01, 1941  DATE OF ADMISSION:  05/12/2011 DATE OF DISCHARGE:                             HISTORY & PHYSICAL   PRIMARY CARE PHYSICIAN AND PRIMARY NEPHROLOGIST:  At [facility].  CHIEF COMPLAINT:  Chest pain and slurred speech.  HISTORY OF PRESENTING ILLNESS:  A 70 year old female with known history of ESRD on hemodialysis over the last 10 years, history of hypertension. Previous history of cigarette smoking, COPD.  He complains of having chest pain.  The patient said the chest pain started last afternoon at 12 o'clock when she was at home.  Pain was retrosternal, pressure-like, and she had 1 episode of nausea and vomiting.  Pain was persistent and had no relation to exertion.  Eventually, she also had mild abdominal pain.  At home around 3 o'clock when she was talking to her husband, she had some mild slurred speech which lasted for less than 10 seconds. Again, when she came to the ER around evening 7 o'clock, the patient again has slurred speech which lasted for a few seconds, again less than 10 seconds.  The patient did not have any headache or visual symptoms, did not have any fever or chills.  Did not lose function of her upper or lower extremities, did not have dizziness, did not have any difficulty swallowing.  The patient at this time has no abdominal pain, has had no dysuria, discharge, or diarrhea.  In addition, the patient is found to be anemic and the patient denies having any black stools or bloody stools.  Did not have any hematemesis or hemoptysis.  At this time, the patient is admitted for chest pain workup along with possible TIA.  At this time, the patient is not a candidate for any t-PA as the patient's symptoms have resolved  completely.  PAST MEDICAL HISTORY: 1. ESRD on hemodialysis. 2. COPD. 3. Hypertension. 4. History of tobacco abuse. 5. History of spontaneous pneumothorax requiring talc pleurodesis.  PAST SURGICAL HISTORY:  He has had left basilic vein transposition surgery done on May 05, 2011, last week by Dr. Imogene Burn.  The patient has a dialysis catheter on the right side of the chest, history of hysterectomy, and talc pleurodesis for the right-sided lung collapse.  MEDICATIONS ON ADMISSION: 1. Tylenol. 2. Synthroid 125 mcg p.o. daily. 3. Sensipar 30 mg daily. 4. Renvela 2.4 g p.o. 3 tablets daily. 5. Nephro-Vite. 6. Lopressor 50 mg twice daily. 7. Isosorbide mononitrate 60 mg daily. 8. Combivent 2 puffs q.6 p.r.n. 9. Benadryl. 10.Amlodipine 10 mg p.o. daily. 11.Advair Diskus 100/50 one puff twice daily.  ALLERGIES:  No known drug allergies except for sea food and shellfish.  FAMILY HISTORY:  Brother died of kidney failure, etiology was unclear.  SOCIAL HISTORY:  The patient denies smoking cigarettes at this time.  He used to smoke, quit 23 years ago.  Denies any alcohol or drug abuse. Lives with the family, is a full  code.  REVIEW OF SYSTEMS:  As present in history of presenting illness, nothing else significant.  PHYSICAL EXAMINATION:  GENERAL:  The patient was examined at bedside, not in acute distress. VITAL SIGNS:  Blood pressure is 137/61, pulse 75 per minute, temperature 98.5, respirations 18 per minute, O2 sat 98%. HEENT: Anicteric.  No pallor.  No discharge from ears, eyes, nose, or mouth. CHEST:  Bilateral air entry present.  No rhonchi.  No crepitation. HEART:  S1, S2 heard. ABDOMEN:  Soft, nontender.  Bowel sounds heard. CNS:  Alert, awake, oriented to time, place, and person.  She is moving upper and lower extremities 5/5.  There is no pronator drift.  No dysdiadochokinesia.  No ataxia.  The patient has no facial asymmetry. Tongue is midline.  PERRLA  positive. EXTREMITIES:  The left upper arm and mild swelling from the recent surgery.  The pulses are good in both upper extremities and lower extremities do not show any acute ischemic changes, cyanosis, or clubbing.  LABORATORY DATA:  EKG shows normal sinus rhythm with some nonspecific ST- T changes which are not in the old EKG.  Heart rate is around 68 beats per minute. CBC:  WBC is 5.3, hemoglobin 7.7, hematocrit 23.3, platelets 172, sed rate is 70.  PT/INR is 14.7 and 1.1.  Basic metabolic panel, sodium 139, potassium 3.6, chloride 94, carbon dioxide 33, glucose 105, BUN 23, creatinine 7, calcium 9.6, CK is 26, CK-MB is 0.9, troponin-I is less than 0.3.  CT of the head without contrast shows no acute intracranial finding, ill-defined hypodense lesions in the left frontal bone, indeterminate, but could be due to metastatic disease or myeloma. Chest x-ray shows chronic cardiomegaly, chronic pulmonary scarring, suspicion of mild superimposed congestive heart failure, fluid overload with mild interstitial edema, and small effusions in the posterior costophrenic angles.  ASSESSMENT: 1. Chest pain, rule out acute coronary syndrome. 2. Possible transient ischemic attack. 3. Abnormal CT head showing a possibility of hypodense lesion in the     left frontal bone which is indeterminate, concerning for metastatic     disease or myeloma. 4. Recent left arm second-stage basilic vein transposition 5. Anemia. 6. End-stage renal disease on hemodialysis on Tuesdays, Thursdays, and     Saturdays. 7. Hypertension. 8. Chronic obstructive pulmonary disease.  PLAN: 1. At this time, admit the patient to telemetry. 2. For her chest pain, we will be cycling cardiac markers.  We will     get a 2-D echo.  We are placing him on nitroglycerin p.r.n.,     aspirin. 3. Possible transient ischemic attack.  The patient will be getting     MRI of the brain and MRA of the brain, carotid Doppler, and 2-D      echo.  We will be placing the patient on neuro checks and aspirin     orally if the patient passes swallow. 4. Increased sed rate and abnormal CT head showing a possibility of     myeloma versus metastatic disease.  We will follow the MRA of the     brain and we also at this time will get a serum protein     electrophoresis.  Considering the sed rate, we do not know the     exact cause.  We will follow the SPEP, and also the sed rate could     be high from her anemia.  At this time also, I am going to get a     peripheral smear study. 5.  Anemia.  At this time, the patient states she did not have any     rectal bleed or any black stools, or she did not vomit any blood.     Her hemoglobin has dropped from 11.6 in April 2012 to 7.7, and she     is asymptomatic at this time.  We will check anemia panel, repeat     CBC in a couple of hours, type and crossmatch 2 units, and we will     follow the peripheral smear study. 6. The patient is a full code and further recommendation as the     condition evolves.     Eduard Clos, MD     ANK/MEDQ  D:  05/13/2011  T:  05/13/2011  Job:  [id number]  Electronically Signed by Midge Minium MD on 05/13/2011 07:19:09 AM

## 2011-05-13 NOTE — H&P (Signed)
  NAMERENNE, CORNICK                ACCOUNT NO.:  [id number]  MEDICAL RECORD NO.:  [id number]           PATIENT TYPE:  E  LOCATION:  [facility]                         FACILITY:  [facility]  PHYSICIAN:  Eduard Clos, MDDATE OF BIRTH:  August 20, 1941  DATE OF ADMISSION:  05/12/2011 DATE OF DISCHARGE:                             HISTORY & PHYSICAL   ADDENDUM:  In addition, the patient also had a recent left arm surgery for transposition of the basilic vein.  At this time, the patient does have mild pain in that area, but her peripheral pulses are bounding and does not show any acute ischemic changes.  We will give a close watch on this, and if pain worsens, we may have to consult Vascular Surgery on that.     Eduard Clos, MD     ANK/MEDQ  D:  05/13/2011  T:  05/13/2011  Job:  [id number]  Electronically Signed by Midge Minium MD on 05/13/2011 07:19:02 AM

## 2011-05-14 LAB — CARDIAC PANEL(CRET KIN+CKTOT+MB+TROPI): Troponin I: <0.3 ng/mL (ref <0.30)

## 2011-05-14 LAB — TYPE AND SCREEN
ABO/RH(D): AB POS
Antibody Screen: NEGATIVE

## 2011-05-14 LAB — HEMOGLOBIN AND HEMATOCRIT, BLOOD
HCT: 30.2 % — ABNORMAL LOW (ref 36.0–46.0)
Hemoglobin: 10 g/dL — ABNORMAL LOW (ref 12.0–15.0)

## 2011-05-14 NOTE — Discharge Summary (Signed)
[place]  Patient:    Meredith, Reynolds Visit Number: [id number] MRN: [id number]          Service Type: MED Location: [phone] Attending Physician:  Jim Desanctis Dictated by:   Sheffield Slider, P.A.C. Admit Date:  08/07/2001 Discharge Date: 08/14/2001   CC:         Theron Arista R. Myna Hidalgo, M.D.  [facility]  Caralee Ates, M.D.   Discharge Summary  DISCHARGE DIAGNOSES: 1. End-stage renal disease with the initiation of hemodialysis. 2. Hypertension. 3. Hypothyroidism. 4. Anemia. 5. Escherichia coli urinary tract infection. 6. Secondary hyperthyroidism.  PROCEDURES: 1. Ultrasound of the abdomen on 08/07/01, showed significantly increased renal    echogenicity consistent with renal medical disease, no hydronephrosis. 2. Transfusion of 2 units packed red blood cells on 08/08/01. 3. Placement of left brachiocephalic fistula and right IJ Ash catheter by Dr.    Elyn Peers on 08/10/01. 4. Hemodialysis. 5. CAT scan of the chest on 08/11/01.  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  Top normal to slightly enlarged right paratracheal    lymph nodes, stability of both described can be concluded on follow-up    exams.  Cardiomegaly with central pulmonary vascular prominence.  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  If further delineation is clinically desired,    MR scan can be considered. 6. Transfusion of 1 unit of packed red blood cells on 08/13/01.  HISTORY OF PRESENT ILLNESS:  [name] is a 70 year old female with past medical history of hypertension, questionable renal failure.  The patient was hospitalized at [facility] 4 years ago, at which time she had a Vas-Cath placed and had one dialysis treatment, and the "got better."  We have no record of this hospitalization.  Medical records was looking for it at the time  of the admission.  She was followed up with Dr. Bascom Levels, their office did not have any records, but Dr. Kathrene Bongo talked to the office, who sent the patient to Dr. Myna Hidalgo because she was anemic.  The patient states that she sees Dr. Myna Hidalgo every six months, has labs done there, and has never been told of kidney problems.  She admits the last two weeks prior to admission she had fatigue, nausea, and weight loss.  Went to Dr. Myna Hidalgo last Thursday, checked her labs then, creatinine was 15.5, BUN 64, potassium 4.5, CO2 11, calcium 4.7, so the patient was sent for evaluation.   Other than those symptoms noted above, she denies no change in urine output, no dysuria, no frequency, no hesitancy, no hematuria, no recent medical changes.  She does have itching.  HOSPITAL COURSE:  The patient presented with a creatinine of over 15, no old labs, and creatinine being elevated was unknown.  Ultrasound showed chronic renal medical disease suggesting that this has been ongoing for some time, just not identified.  Other labs done on admission were ESPEP which was nonspecific.  A 24 hour urine was inaccurate.  Urine culture grew greater than 100,000 E. coli for which she was then started on Tequin.  Admission labs on 08/07/01, showed a hemoglobin of 7.5, hematocrit 21.6, platelets 200,000. Sodium 137, potassium 4.4, chloride 113, CO2 15, glucose 82, BUN 64, creatinine 5.3, calcium 4.5, albumin 2.5.  Liver function tests within normal limits.  Phosphorus 11.3.  Iron studies showed 33%, ferratin 897.  Parathyroid  hormone was 274.  The patient was seen by Dr. Myna Hidalgo initially on admission, and she was transferred to the care of [facility] to implement her dialysis program.  She had a fistula catheter placed on 8/15, she received her first dialysis treatment on 8/16, had difficulty tolerating it, received another treatment the following day, and did better.  Epogen was started.   The patient did not need supplemental iron.  Blood pressure was controlled with medications and volume removal.  She initially had been placed on sodium bicarbonate, but this and Lasix were discontinued once dialysis was initiated. Chemistries improved dramatically by 8/19.  The patient had a hemoglobin of 10.3, platelets 174,000, sodium 136, potassium 3.7, chloride 99, CO2 26, glucose 96, BUN 22, creatinine 8.7, calcium 6.9, albumin 2.1, phosphorus 5.2. Chest x-ray identified a poorly defined 1.1 cm nodular density in the right upper lobe.  A CAT scan was ordered.  Upon further investigation, she had a history of ______ pleurodesis 5 to 6 years ago for a pneumothorax.  Dr. Hyman Hopes made an appointment for the patient to follow up with pulmonary after discharge.  The patient was discharged home on 08/14/01, considerably improved.  DISCHARGE MEDICATIONS: 1. Nephro-Vite one tablet q.d. 2. Calcium carbonate 500 mg one tablet t.i.d. with meals. 3. Lopressor 50 mg q.d. 4. Imdur 60 mg q.d. 5. Plendil 10 mg q.d. 6. Folic acid 1 mg q.d. 7. Synthroid 0.125 mg q.d. 8. Sarna lotion p.r.n.  The patient is instructed to stop her Lasix.  DIET:  A 90 g protein, 2 g sodium, 2 g potassium diet, limit fluids to 5 cups per day.  DIALYSIS SCHEDULE:  Tuesday, Thursday, Saturday.  Dialysis orders were called to the outpatient center.  The patient is instructed to keep her catheter dressing dry, no showers. Epogen dose is 10,000 units, Calcijex 0.5 mcg, estimated dry weight is 89 kg.Dictated by:   Sheffield Slider, P.A.C. Attending Physician:  Jim Desanctis DD:  11/02/01 TD:  11/03/01 Job: [id number]

## 2011-05-14 NOTE — H&P (Signed)
[place]. [facility]  Patient:    [name], [name]                         MRN: [id number] Adm. Date:  [id number] Attending:  Jim Desanctis CC:         Rose Phi. Myna Hidalgo, M.D.   History and Physical  REFERRING PHYSICIAN:  Dr. Rose Phi. Ennever.  HISTORY OF PRESENT ILLNESS:  Ms. Cutright is a 70 year old white female with a past medical history significant for hypertension and possibly renal failure. The patient states she was hospitalized at [facility] approximately four years ago, at which time she had a vascular catheter placed and had one dialysis treatment, and then "got better."  (We unfortunately have no record of this hospitalization.  Medical Records is looking for it.)  She followed up with Dr. Jarome Matin.  (I called Dr. Cindra Presume office, and they do not have records on her either.)  He sent her to Dr. Myna Hidalgo because of "anemia."  She states that she has seen Dr. Myna Hidalgo on a every six-month basis, and has labs checked, and was never told of any kidney problem.  She was in her usual state of health until she admits over the last two weeks an increasing fatigue, nausea with vomiting, as well as weight loss and itching. She went to Dr. Tama Gander office last Thursday, and had labs checked.  These revealed a creatinine of 15.5, BUN 64, potassium 4.5, bicarbonate 11, and calcium of 4.7.  After a review of these labs, she was sent here today for an evaluation.  Other than the symptoms noted above, she denies any abnormality. More specifically she denies any change in urine output.  No dysuria, frequency, hesitancy, or hematuria.  She also denies any administration of new medication.  PAST MEDICAL HISTORY: 1. Hypertension x 8 years, on medications.  Has been under good control. 2. Episode of cellulitis to her left lower extremity which took place in    April 2002. 3. Previous history of renal failure, as noted above.  There is  no history of diabetes mellitus or any increased cholesterol.  PAST SURGICAL HISTORY: 1. Positive for a hysterectomy. 2. She also states she has had a "lung collapse" requiring some kind of    surgical  procedure.  CURRENT MEDICATIONS: 1. Lopressor 50 mg p.o. q.d. 2. Iron tablets. 3. Synthroid 0.125 mg p.o. q.d. 4. Lasix 40 mg q.d. 5. Imdur 60 mg p.o. q.d. 6. Plendil 10 mg p.o. q.d. 7. Folic acid 1 mg p.o. q.d. 8. Tylenol p.r.n. 9. She actually was recently started on Epogen per Dr. Myna Hidalgo.  She denies any nonsteroidal anti-inflammatory drugs or recent ACE inhibitors.  ALLERGIES:  No known drug allergies.  SOCIAL HISTORY:  She denies any tobacco or alcohol use.  She also denies any limitations with her activity.  FAMILY HISTORY:  Her brother died of kidney failure, but the etiology was unknown.  Her family history is otherwise negative for kidney disease.  REVIEW OF SYSTEMS:  Positive for nausea, vomiting, fatigue, itching, lower extremity edema and weight loss.  Negative for diarrhea, constipation, shortness of breath, cough, rash, abdominal pain, or any change in urine output.  REVIEW OF SYSTEMS:  Otherwise noted in the history of present illness. Otherwise all review of systems are negative.  PHYSICAL EXAMINATION:  VITAL SIGNS:  Temperature 98.3 degrees, blood pressure 114/69, pulse 68, respirations 20.  GENERAL:  This is a  relatively well-appearing white female, who is alert and oriented, and in no acute distress.  HEENT:  Pupils equal, round, reactive to light.  Extraocular movements intact. Mucous membranes moist without lesion.  NECK:  There is no jugular venous distention, no lymphadenopathy.  There is also no thyromegaly.  There are no carotid bruits present.  LUNGS:  Clear to auscultation bilaterally without wheezes.  CARDIOVASCULAR:  A regular rate and rhythm without murmur, gallop, or rub.  ABDOMEN:  Positive bowel sounds, soft, nontender,  nondistended.  There is no hepatosplenomegaly.  No masses noted.  There is no abdominal bruit appreciated.  EXTREMITIES:  There is 1+ edema to the lower extremities bilaterally.  There is evidence of old cellulitis in the left lower extremity notable by skin changes.  NEUROLOGIC:  The patient is alert and oriented x 4.  There is no asterixis.  LABORATORY DATA:  Obtained on August 03, 2001, reveal a sodium of 142, potassium 4.5, chloride 111, bicarbonate 11, BUN 64, creatinine 15.5, with a glucose of 87.  AST 10, ALT 8, albumin 3, calcium 4.7.  ASSESSMENT:  A 71 year old female with a creatinine of greater than 15, with no old laboratory  to compare it to, and no obvious recent insults to cause acute renal failure.  PLAN: 1. Renal:  Creatinine of greater than 15.  We do not know the duration of    her renal failure.  I suspect that she may have just advanced chronic    renal insufficiency, based on her story of almost needing dialysis four    years ago, and being sent to Dr. Bascom Levels after that episode.  At this    time, we will continue to attempt to obtain old records on this woman,    and will admit her to the hospital and check a renal ultrasound,    urinalysis, S-PEP, and U-PEP.  There is no absolute indication for    hemodialysis at this time.  We will continue to watch her.  If this    renal insufficiency appears to be chronic, we will proceed with dialysis    access placement, and initiate Ms. Uppal on dialysis. 2. Electrolytes:  I will check a phosphorus and an intact PTH.  Will    start calcium at 1500 t.i.d. 3. Anemia:  Will check iron stores and continue Epogen therapy. 4. Hypertension:  This is actually under good control.  Will continue    her current medications. 4. Hypothyroidism:  Will continue the Synthroid and check a TSH. 5. Acid-based status:  She has a metabolic acidosis.  This is most likely    related to her renal failure.  I will start her on bicarbonate  repletion    at 650 mg t.i.d., and we will continue to monitor this.  DD:  08/07/01 TD:  08/07/01 Job: [id number]

## 2011-05-14 NOTE — Op Note (Signed)
[place]. [facility]  Patient:    [name], [name]                         MRN: [id number] Proc. Date: 08/10/01 Adm. Date:  [id number] Attending:  Jim Desanctis                           Operative Report  PREOPERATIVE DIAGNOSIS:  End-stage renal disease.  POSTOPERATIVE DIAGNOSIS:  End-stage renal disease.  PROCEDURE: 1. Left brachiocephalic arteriovenous fistula. 2. Right internal jugular Ash catheter placement.  SURGEON:  Caralee Ates, M.D.  ASSISTANT:  Sherrie George, P.A.  ANESTHESIA:  General endotracheal.  ESTIMATED BLOOD LOSS:  100 cc.  DRAINS:  None.  SPECIMENS:  None.  COMPLICATIONS:  None.  BRIEF HISTORY:  This patient is a 70 year old white female with end-stage renal disease who is anticipated to require hemodialysis immediately.  She was here for replacement of Ash blood catheter and a left forearm AV access.  The risks, benefits, alternatives to procedure were explained in detail to the patient.  She agreed to proceed.  DESCRIPTION OF PROCEDURE:  The patient was brought to the operating room and placed on the operating table in the supine position.  Upon adequate general endotracheal anesthesia, the left arm was extended on an arm board and draped and prepped in a sterile fashion.  A small transverse incision was made just below the elbow and the antecubital vein was identified at this level, dissected circumferentially and controlled proximally and distally with vessel loops.  Its major branches were ligated with 3-0 silk ties.  A brachial artery at this level was also identified and dissected circumferentially and controlled with vessel loops.  The patient ______ .  Following an adequate three-minute circulation time, the vein was transsected distally and its distal stump was ligated.  The venous outflow was then instilled with heparin saline solution and found to have an excellent thrill distally with a strong injection.   ______ was then placed on the vein.  The vessel loops on the brachial artery were tightened ______ 4-0.  A longitudinal ______ was performed.  The vein was then transposed to the artery and sewn in position in an end-to-side fashion with a running 7-0 Prolene suture.  ______ anastomosis of the native vessels were backbled and flushed and the graft was backbled. When the anastomosis was completed, the clamps and vessel loops were released restoring flow.  There was a palpable radial pulse at this point and excellent thrill in the fistula.  Next, the wound was irrigated copiously with warm sterile saline.  Hemostasis was achieved and the wound was closed in layers with 3-0 and 4-0 Vicryl sutures.  Sterile dry dressings were applied.  The patient was then redraped and reprepped exposing the right neck.  The right internal jugular vein was percutaneously punctured.  A guidewire was then advanced through the needle into the IJ and SVC under direct fluoroscopic vision.  A small stab incision was made at this level.  A second stab incision was made laterally and the Ash catheter was passed in the subcutaneous plane ______ .  Serial dilatation was then performed and the Ash catheter was placed ______ sheath under direct vision.  Next the ports were flushed and aspirated and there was some difficulty aspirating the arterial port; therefore, following several various maneuvers to alleviate the kink which were unsuccessful, the catheter was transected.  A guidewire was advanced through the distal portion in the SVC and a second Ash catheter was tunneled through a second lateral stab incision and advanced through a split sheath into position.  This time there was minimal kinking and excellent flushing and backbleeding from the port.  The wounds were then closed in layers with 3-0 Vicryl suture and the catheter was secured to the skin with a 4-0 nylon. Sterile dry dressings were applied.  The patient  was awakened from anesthesia and transferred to the recovery room in stable condition.  The patient tolerated the procedure well.  No complications appeared.  All needle and sponge counts were correct. DD:  08/10/01 TD:  08/10/01 Job: [id number]

## 2011-05-14 NOTE — Discharge Summary (Signed)
NAMESYDNEI, Reynolds                ACCOUNT NO.:  [id number]   MEDICAL RECORD NO.:  [id number]          PATIENT TYPE:  INP   LOCATION:  6702                         FACILITY:  [facility]   PHYSICIAN:  Cecille Aver, M.D.DATE OF BIRTH:  [birth year]   DATE OF ADMISSION:  12/27/2007  DATE OF DISCHARGE:  01/01/2008                               DISCHARGE SUMMARY   DISCHARGE DIAGNOSES:  1. Right apical lung defect noted on CT scan.  2. End-stage renal disease with volume overload on admission,      resolved.  3. Hemolytic anemia.  4. Hypertension.  5. Secondary hyperparathyroidism.   CONSULTATIONS:  Dr. Levy Pupa.   PROCEDURES:  December 29, 2007 CT angiography of chest with contrast.  Impression:  No evidence of pulmonary emboli.  Infiltrate in the right  upper lobe consistent with pneumonia.  I am concerned that this could  represent an atypical infection.   PROCEDURE:  Hemodialysis.   CONSULTATIONS:  Dr. Levy Pupa.   HISTORY OF PRESENT ILLNESS:  [name] is a 70 year old Caucasian female  with past medical history for end-stage renal disease.  The patient  presented with shortness of breath and dyspnea on exertion during  treatment today.  She did complete her dialysis and got to her dry  weight but was still short of breath, so she was sent to the [facility]  emergency department for evaluation and treatment.  Chest x-ray was  consistent with pulmonary edema plus or minus pneumonia.  She was  treated with Solu-Medrol, breathing treatments well as antihypertensives  and Levaquin.  She also had a CT scan that was only unremarkable for  small pleural effusions in the chest.  The patient therefore was  transferred here for further evaluation and treatment.  She states that  she did feel better since this all started.  She denies fever.  She did  have a productive cough but said it was clear.  She states she is status  post an antibiotic course given through the kidney center  of  amoxicillin.   ADMISSION LABORATORY DATA:  WBC 5.1, hemoglobin 8.7, potassium 3.1,  creatinine 10.  ABG revealed a pO2 of 63 on nasal cannula O2.   HOSPITAL COURSE:  1. End-stage renal disease with volume excess on admission.  Chest x-      ray consistent with pulmonary edema.  The patient was admitted.      She had emergent hemodialysis with maximum ultrafiltration and a      decrease in her dry weight.  The patient's productive cough and      absence of leukocytosis was not suggestive of current infection.      Therefore, no antibiotics were given.  The patient did have a net      ultrafiltration that was approximately 5-6 kg lower than her old      estimated dry weight in the dialysis center.  She is asymptomatic      at time of discharge, and we will continue to lower her dry weight      in the outpatient unit.  2. Right atypical  lung defect.  This was seen on CT scan that was      ordered on admission.  We did obtain a consultation from Dr. Delton Coombes      on January 2.  He was most suspicious that this actually      represented chronic changes, possibly from tap pleurodesis.      However, a sputum for acid-fast bacilli were obtained and a PPD.      These were all negative in work up.  Therefore, he did not actively      treat with antibiotics, and the patient suggested that she undergo      a repeat CT scan in 6 months for follow-up which we will handle at      the outpatient unit.  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  EPO increased this      admission.  No transfusions needed.  Follow-up as an outpatient.  4. Hypertension.  Good this admission.  No medication changes.      However, with ultrafiltration, blood pressures were more      appropriate and within range.  5. Secondary hyperparathyroidism.  The patient continued her vitamin      D.   DISCHARGE INSTRUCTIONS:  The patient is to continue with her home  medications without changes.   DISCHARGE INSTRUCTIONS:   Follow up with hemodialysis on normal scheduled  day.  Continue to decrease her dry weight as tolerated.  Continue renal  diet 80 g of protein, 2 g of salt, 2 g of potassium of 1200 mL per day  fluid restriction.  Noted discharge hemoglobin 12.6.      Meredith Fallen, PA    ______________________________  Cecille Aver, M.D.    MY/MEDQ  D:  01/17/2008  T:  01/18/2008  Job:  [id number]   cc:   [facility]  [facility]

## 2011-05-17 LAB — PROTEIN ELECTROPHORESIS, SERUM
Albumin ELP: 55.5 % — ABNORMAL LOW (ref 55.8–66.1)
Alpha-1-Globulin: 7.8 % — ABNORMAL HIGH (ref 2.9–4.9)
Alpha-2-Globulin: 13.9 % — ABNORMAL HIGH (ref 7.1–11.8)
Beta 2: 4.9 % (ref 3.2–6.5)
Beta Globulin: 5.4 % (ref 4.7–7.2)
Gamma Globulin: 12.5 % (ref 11.1–18.8)

## 2011-05-18 NOTE — Op Note (Signed)
[name], [name]                ACCOUNT NO.:  [id number]  MEDICAL RECORD NO.:  [id number]           PATIENT TYPE:  O  LOCATION:  SDSC                         FACILITY:  [facility]  PHYSICIAN:  Fransisco Hertz, MD       DATE OF BIRTH:  Oct 22, 1941  DATE OF PROCEDURE:  05/05/2011 DATE OF DISCHARGE:                              OPERATIVE REPORT   PROCEDURE:  Left second stage basilic vein transposition.  PREOPERATIVE DIAGNOSES:  End-stage renal disease requiring hemodialysis and a status post previous left first stage basilic vein transposition.  POSTOPERATIVE DIAGNOSES:  End-stage renal disease requiring hemodialysis and a status post previous left first stage basilic vein transposition.  SURGEON:  Arlys John L. Imogene Burn, MD  ASSISTANT:  Newton Pigg, PA-C  ANESTHESIA:  General.  FINDINGS:  She had a palpable left radial pulse at the end of the case and upper arm thrill that was easily palpable at the site of the basilic vein transposition.  SPECIMENS:  None.  ESTIMATED BLOOD LOSS:  Minimal.  INDICATIONS:  This is a 70 year old patient who most recently had undergone a left first stage basilic vein transposition.  Unfortunately, her previous right brachiocephalic arteriovenous fistula failed to adequately mature and has outflow venous issues that would prevent this fistula for ever being useful.  Subsequently, we proceeded with placement of a left first stage basilic vein transposition and on followup her vein had dilated adequately for transposition of the basilic vein.  She is aware of the risks of this procedure include bleeding, infection, possible steal syndrome, possible nerve damage, possible ischemic monomelic neuropathy, possible thrombosis of this fistula, and possible need for additional procedures.  She is aware of the risks and agreed to proceed forward.  DESCRIPTION OF OPERATION:  After full informed written consent was obtained from the patient, she was brought  back to the operating room, placed supine upon the operating room table.  Prior to induction she had received IV antibiotics.  She was then prepped and draped in a standard fashion for left arm access procedure after obtaining adequate anesthesia.  We turned our attention to her antecubitum and I made a longitudinal incision over the basilic vein near the previous incision in the antecubitum and dissected down to the vein which was quite deep. I was able to verify this was in fact the basilic vein and there was a good palpable thrill in it.  Using the vein then I dissected in a perivenous fashion to help determine the direction of the vein.  I then made incision from the antecubitum all the way up to the axilla in a longitudinal fashion to completely unroof this vein.  Using electrocautery and blunt dissection, I did go through the subcutaneous tissue taking care to avoid any injuries to adjacent nerve structures. I was able to eventually dissect a single sensory nerve branch off of the basilic vein.  This one branch was interdigitated around the vein and had to be  transected to mobilize this vein.  I was able to fully mobilize it from its  anastomosis at antecubitum all the way up to the axilla.  I then was able to dissect a plane on top of the biceps and then made a groove through the fascia into the subcutaneous tissue, so that there was felt 2-3 mm of tissues remaining between the skin and this groove.  I then transposed this left basilic vein which was anywhere from 7 mm up to 9 mm into this groove that I had been made.  Then I tacked it in place with 5 interrupted stitches of 2-0 Vicryl.  This vein was noted be nice and loose with no compression with any of the tacking stitches.  There was still a strongly palpable thrill at the end of this case.  At this point I tested the patient's left wrist.  There was a palpable radial pulse and then a easily palpable thrill in the upper arm.   The Doppler ultrasound confirmed these findings, multiphasic radial pulse and also a widely patent arteriovenous fistula flow signature.  At this point I irrigated out the wound.  A few bleeding points were controlled with titanium clips and electrocautery.  By the end of this case, there was no more active bleeding.  The subcutaneous tissue reapproximated with running stitch of 3-0 Vicryl.  The skin was then reapproximated with running subcuticular 4-0 Monocryl tied from both ends and then tied in the middle.  Skin was cleaned, dried and then Dermabond applied to this wound.  The patient was allowed to awaken without any difficulties. Plan is discharge her home.  COMPLICATIONS:  None.  CONDITION:  Stable.     Fransisco Hertz, MD     BLC/MEDQ  D:  05/05/2011  T:  05/06/2011  Job:  [id number]  Electronically Signed by Leonides Sake MD on 05/18/2011 10:43:29 AM

## 2011-05-27 ENCOUNTER — Ambulatory Visit (INDEPENDENT_AMBULATORY_CARE_PROVIDER_SITE_OTHER): Payer: Medicare Other

## 2011-05-27 DIAGNOSIS — N186 End stage renal disease: Secondary | ICD-10-CM

## 2011-05-27 NOTE — Discharge Summary (Signed)
[name]                ACCOUNT NO.:  [id number]  MEDICAL RECORD NO.:  [id number]           PATIENT TYPE:  I  LOCATION:  3713                         FACILITY:  [facility]  PHYSICIAN:  Altha Harm, MDDATE OF BIRTH:  12/01/1941  DATE OF ADMISSION:  05/12/2011 DATE OF DISCHARGE:  05/14/2011                              DISCHARGE SUMMARY   DISCHARGE PHYSICIAN:  Home.  FINAL DISCHARGE DIAGNOSES: 1. Transient ischemic attack. 2. Chest pain, likely musculoskeletal. 3. End-stage renal disease, hemodialysis Tuesday, Thursday, Saturday. 4. Hypertension. 5. Chronic obstructive pulmonary disease, quiescent. 6. Tobacco use disorder. 7. Hypothyroidism.  Discharge medications include the following: 1. Aranesp intravenously Thursdays at hemodialysis. 2. Ferrous gluconate 62.5 intravenously on Thursdays at dialysis. 3. Amlodipine 10 mg p.o. daily at bedtime. 4. Advair Diskus 100/50 one puff inhaled q.12 h. 5. Benadryl 25 mg p.o. q.6 h p.r.n. itching and allergies. 6. Combivent inhaler 2 puffs inhaled q.6 h. p.r.n. shortness. 7. Isosorbide mononitrate XR 60 mg p.o. at bedtime. 8. Lopressor 50 mg p.o. b.i.d. at 6:30, except on dialysis days. 9. Nephro-Vite 1 tablet p.o. at bedtime. 10.Sevelamer carbonate/Renvela 2.4 g 1-2 packets p.o. t.i.d. with     meals. 11.Sensipar 30 mg p.o. at bedtime. 12.Synthroid 125 mcg p.o. at bedtime. 13.Tylenol Extra Strength 2 tablets q.6 h. p.r.n. pain.  CONSULTANTS:  Nephrology.  DIAGNOSTIC STUDIES: 1. Two-view chest x-ray on admission which shows chronic pulmonary     scarring and chronic cardiomegaly.  Suspicion for mild superimposed     congestive heart failure, fluid overload, mild interstitial edema     in the posterior costophrenic angle. 2. CT of the head without contrast which shows no acute intracranial     abnormality.  Impression, ill-defined hyperdense lesion in the left     frontal bone are indeterminate that could be due to  metastatic     disease or myeloma. 3. MRI of the brain without contrast which shows no acute infarct. 4. MRA of the cerebral vessel shows mild motion, mild irregularity     without medium or large vessel significant stenosis or occlusion. 5. A 2-D echocardiogram done on May 13, 2011, which shows left     ventricular cavity size normal with ejection fraction of 60%.     There are no regional wall motion abnormalities. 6. Carotid duplex which shows there is no significant extracranial     carotid artery stenosis demonstrated.  The vertebrals are with     antegrade flow.  PRIMARY CARE PHYSICIAN:  Unassigned.  CODE STATUS:  Full code.  ALLERGIES:  No known drug allergies.  CHIEF COMPLAINTS:  Chest pain and slurred speech.  HISTORY OF PRESENT ILLNESS:  Please refer to the H and P by Dr. Toniann Fail for details of the HPI.  However, in short this is a [name] with end-stage renal disease who complains of having chest pain.  The patient states the pain started on that afternoon.  The pain was retrosternal, pressure like, associated with nausea and vomiting. The patient was also having mild abdominal pain and went hopping to her husband, noticed that she was having some  slurred speech which lasted for less than 10 seconds.  The slurred speech reoccurred again several times prior to her coming to the emergency room.  Upon arrival to the emergency room, the patient was referred to [facility] for further evaluation and management.  Chest pain:  The patient's chest pain resolved spontaneously without any intervention.  The patient was ruled out for left hand ischemia with serial enzymes and her cardiac enzymes were normal.  A 12-lead EKG was within normal limits.  The patient ambulated and did not reproduce her chest pain.  I will defer to the patient's primary care physician and she can follow up as an outpatient if necessary for further resuscitation  evaluation.  Slurred speech/TIA:  The patient likely had a TIA.  Her radiologic studies did not reveal any evidence of infarct and she had no findings of occlusion on her ultrasound.  Again, the slurred speech resolved and the patient again was transferred into the ward of the hospital.  The patient is no longer having any of these symptoms.  Risk factors were evaluated and the patient was found to have no significant risk factors that needs to be modified at this time.  All of the medical problems remained stable.  At the time of discharge, the patient is stable.  PHYSICAL EXAMINATION:  VITAL SIGNS:  Her temperature is 97.7, heart rate 66, blood pressure 160/65, respiratory rate 18, O2 sats are 95% on room air. GENERAL:  The patient is well appearing in no acute distress. HEENT:  She is normocephalic and atraumatic.  Pupils are equally round and reactive to light and accommodation.  Extraocular movements are intact.  Oropharynx is moist.  No exudate, erythema, or lesions are noted. NECK:  Her trachea is midline.  No masses.  No thyromegaly.  No JVD.  No carotid bruit. ABDOMEN:  Obese, soft, nontender, nondistended.  No masses.  No hepatosplenomegaly is noted. EXTREMITIES:  No clubbing, cyanosis, or edema.  DIETARY RESTRICTIONS:  The patient should be on a heart-healthy diet.  PHYSICAL RESTRICTIONS:  None.  FOLLOWUP:  The patient is to follow up with her primary care physician within 3-5 days.  Total time for this discharge process including face-to-face time approximately 35 minutes.     Altha Harm, MD     MAM/MEDQ  D:  05/14/2011  T:  05/15/2011  Job:  [id number]  Electronically Signed by Marthann Schiller MD on 05/27/2011 09:26:03 AM

## 2011-05-27 NOTE — Assessment & Plan Note (Signed)
OFFICE VISIT  [name], [name] DOB:  [birth year]                                       05/27/2011 [id number]#:[id number]  DATE OF SURGERY:  May 05, 2011.  The patient is a 70 year old female who presents for routine follow-up status post second-stage left basilic vein transposition performed on May 05, 2011 by Dr. Imogene Burn.  The patient states that she has been doing well with the exception of some pain in her hands today that she experienced while on hemodialysis.  The patient is dialyzing via a Diatek catheter on the right.  She denies numbness and tingling in the hand as well as pain in the hand at any other time.  PHYSICAL EXAMINATION:  Blood pressure 114/65, right arm sitting, heart rate 77, O2 sat 100% on room air.  In general, she is well-nourished in no acute distress.  There is a superficial wound dehiscence present in the middle incision over the basilic vein transposition.  It has an eschar present over the top of this with no evidence of drainage, erythema, or cellulitis.  She states that it has been that way for approximately 1-2 weeks, and she has just been cleaning it with soap and water.  There is a strong thrill present in the fistula.  There is a 1+ left radial pulse.  Her hand is cool, but motor and sensation are intact.  ASSESSMENT/PLAN:  The patient is doing well status post second-stage left basilic vein transposition.  She is discharged to continue to wash the open area with soap and water and keep it clean and dry.  She is also instructed that if she notices the wound to progress or to begin to change in any way, she is to contact us immediately.  The patient will continue to dialyze via Diatek until her fistula is completely healed and satisfactory for use.  Patient will follow up with Dr. Imogene Burn in 1-2 weeks for reevaluation.  The patient knows to call with any questions, issues, or problems in the interim.  Pecola Leisure, PA  Charles  E. Fields, MD Electronically Signed  AY/MEDQ  D:  05/27/2011  T:  05/27/2011  Job:  [id number]

## 2011-06-11 ENCOUNTER — Ambulatory Visit (INDEPENDENT_AMBULATORY_CARE_PROVIDER_SITE_OTHER): Payer: Medicare Other

## 2011-06-11 DIAGNOSIS — N186 End stage renal disease: Secondary | ICD-10-CM

## 2011-06-11 NOTE — Assessment & Plan Note (Signed)
OFFICE VISIT  [name], [name] DOB:  December 03, 1941                                       06/11/2011 [id number]#:[id number]  Date of surgery was 05/05/2011.  This is a 70 year old female who presents for followup status post second stage basilic vein transposition on 05/05/2011 by Dr. Imogene Burn.  The patient did have a superficial wound that has no drainage.  The patient states that it is doing better than it was at one time.  She denies any steal symptoms such as numbness or tingling in her hand as well as any pain.  PHYSICAL EXAM:  Her blood pressure is 120/60 in the right arm.  Heart rate 70.  She is 100% on room air.  Respirations 18.  She is a well- nourished female.  The superficial wound is in the middle of her basilic vein transformation incision.  There is no evidence of drainage or cellulitis.  She states that she has been cleaning it with soap and water and they have been taking care of this at the dialysis center.  ASSESSMENT AND PLAN:  This patient is doing well and she has a good thrill in her basilic vein transposition.  Dr. Imogene Burn also saw the patient and stated that it was okay for dialysis to go ahead and use her fistula.  She is to continue current wound care as her wound is healing.  Newton Pigg, [place]  Fransisco Hertz, MD Electronically Signed  SE/MEDQ  D:  06/11/2011  T:  06/11/2011  Job:  [id number]

## 2011-08-09 ENCOUNTER — Ambulatory Visit (HOSPITAL_COMMUNITY)
Admission: RE | Admit: 2011-08-09 | Discharge: 2011-08-09 | Disposition: A | Discharge status: Home / Self Care | Payer: Medicare Other | Source: Ambulatory Visit | Attending: Vascular Surgery | Admitting: Vascular Surgery

## 2011-08-09 DIAGNOSIS — T82598A Other mechanical complication of other cardiac and vascular devices and implants, initial encounter: Secondary | ICD-10-CM | POA: Insufficient documentation

## 2011-08-09 DIAGNOSIS — Y832 Surgical operation with anastomosis, bypass or graft as the cause of abnormal reaction of the patient, or of later complication, without mention of misadventure at the time of the procedure: Secondary | ICD-10-CM | POA: Insufficient documentation

## 2011-08-09 DIAGNOSIS — N186 End stage renal disease: Secondary | ICD-10-CM

## 2011-08-09 DIAGNOSIS — I120 Hypertensive chronic kidney disease with stage 5 chronic kidney disease or end stage renal disease: Secondary | ICD-10-CM

## 2011-08-09 DIAGNOSIS — T82898A Other specified complication of vascular prosthetic devices, implants and grafts, initial encounter: Secondary | ICD-10-CM

## 2011-08-09 LAB — POCT I-STAT, CHEM 8
BUN: 30 mg/dL — ABNORMAL HIGH (ref 6–23)
Calcium, Ion: 0.67 mmol/L — CL (ref 1.12–1.32)
Chloride: 105 mEq/L (ref 96–112)
Creatinine, Ser: 8.1 mg/dL — ABNORMAL HIGH (ref 0.50–1.10)
Glucose, Bld: 83 mg/dL (ref 70–99)
TCO2: 22 mmol/L (ref 0–100)

## 2011-08-11 NOTE — Op Note (Signed)
  NAMEATTALLAH, [name]                ACCOUNT NO.:  [id number]  MEDICAL RECORD NO.:  [id number]  LOCATION:  SDSC                         FACILITY:  [facility]  PHYSICIAN:  Di Kindle. Edilia Bo, M.D.DATE OF BIRTH:  1941-09-06  DATE OF PROCEDURE:  08/09/2011 DATE OF DISCHARGE:  08/09/2011                              OPERATIVE REPORT   PREOPERATIVE DIAGNOSIS:  Poorly maturing left upper arm arteriovenous fistula.  POSTOPERATIVE DIAGNOSIS:  Poorly maturing left upper arm arteriovenous fistula.  PROCEDURES: 1. Ultrasound-guided access to the left arteriovenous fistula. 2. Left upper extremity fistulogram. 3. Percutaneous transluminal angioplasty with a 6 mm x 4 cm Cordis     balloon and then an 8 mm x 4 cm Cordis balloon.  SURGEON:  Di Kindle. Edilia Bo, MD  ANESTHESIA:  Local.  TECHNIQUE:  The patient was taken to the PV lab and the left upper extremity was prepped and draped in usual sterile fashion.  After the skin was infiltrated with 1% lidocaine and under ultrasound guidance, the upper arm fistula (which was a basilic vein transposition) was cannulated under direct vision.  This was done with a micropuncture set. The guidewire was introduced and then the micropuncture sheath introduced.  Fistulogram was obtained.  The only significant finding was an area of narrowing in the upper arm basilic vein just below the axilla.  This appeared to be about a 40-50% area that was fairly smooth. However, given the fistula was not maturing, it was felt that it would be best to do an venoplasty of this area.  Of note, the arterial anastomosis looked widely patent and there was no evidence of any outflow obstruction.  The central veins were patent on the left.  The micropuncture sheath was exchanged for a short 6-French sheath and the patient received 3000 units of IV heparin.  A guidewire was then introduced and a Cordis 6 mm x 4 cm balloon was positioned across the area of narrowing,  inflated 10 atmospheres for 30 seconds.  We then went to an 8 mm x 4 cm balloon which was positioned across the stenosis and inflated to 10 atmospheres.  There was excellent result with no residual stenosis.  At the completion, a suture was placed, pressure held for hemostasis, and no immediate complications were noted.  FINDINGS:  The fistula is widely patent.  This is a basilic vein transposition.  The arterial anastomosis is widely patent.  There is no evidence of outflow obstruction.  The subclavian and left brachiocephalic veins are patent and the arterial anastomosis is widely patent.     Di Kindle. Edilia Bo, M.D.     CSD/MEDQ  D:  08/09/2011  T:  08/09/2011  Job:  [id number]  Electronically Signed by Waverly Ferrari M.D. on 08/11/2011 09:19:11 AM

## 2011-09-15 LAB — CROSSMATCH: ABO/RH(D): AB POS

## 2011-09-15 LAB — CBC
HCT: 37.6
Hemoglobin: 11.1 — ABNORMAL LOW
Hemoglobin: 12.6
MCHC: 33.5
Platelets: 285
Platelets: 286
Platelets: 335
Platelets: 338
RDW: 20.3 — ABNORMAL HIGH
RDW: 21.2 — ABNORMAL HIGH
WBC: 7.2
WBC: 7.7

## 2011-09-15 LAB — DIRECT ANTIGLOBULIN TEST (NOT AT ARMC): DAT, IgG: NEGATIVE

## 2011-09-15 LAB — COMPREHENSIVE METABOLIC PANEL
ALT: 13
Albumin: 3.1 — ABNORMAL LOW
Alkaline Phosphatase: 41
Calcium: 10
Glucose, Bld: 91
Potassium: 3.8
Sodium: 139
Total Protein: 6.4

## 2011-09-15 LAB — DIFFERENTIAL
Basophils Relative: 1
Eosinophils Absolute: 0.1
Eosinophils Relative: 2
Lymphocytes Relative: 30
Monocytes Absolute: 0.3
Neutro Abs: 3.1
Neutrophils Relative %: 62

## 2011-09-15 LAB — RENAL FUNCTION PANEL
Calcium: 11.2 — ABNORMAL HIGH
GFR calc Af Amer: 8 — ABNORMAL LOW
GFR calc non Af Amer: 7 — ABNORMAL LOW
Phosphorus: 3.6
Sodium: 136

## 2011-09-15 LAB — PHOSPHORUS: Phosphorus: 2.6

## 2011-10-01 LAB — CROSSMATCH

## 2011-10-06 ENCOUNTER — Ambulatory Visit: Payer: Self-pay | Admitting: Internal Medicine

## 2012-03-07 ENCOUNTER — Other Ambulatory Visit (HOSPITAL_COMMUNITY): Payer: Self-pay | Admitting: Nephrology

## 2012-03-07 DIAGNOSIS — N186 End stage renal disease: Secondary | ICD-10-CM

## 2012-03-10 ENCOUNTER — Other Ambulatory Visit (HOSPITAL_COMMUNITY): Payer: Self-pay | Admitting: Nephrology

## 2012-03-10 ENCOUNTER — Ambulatory Visit (HOSPITAL_COMMUNITY)
Admission: RE | Admit: 2012-03-10 | Discharge: 2012-03-10 | Disposition: A | Discharge status: Home / Self Care | Payer: Medicare Other | Source: Ambulatory Visit | Attending: Nephrology | Admitting: Nephrology

## 2012-03-10 DIAGNOSIS — I129 Hypertensive chronic kidney disease with stage 1 through stage 4 chronic kidney disease, or unspecified chronic kidney disease: Secondary | ICD-10-CM | POA: Insufficient documentation

## 2012-03-10 DIAGNOSIS — T82898A Other specified complication of vascular prosthetic devices, implants and grafts, initial encounter: Secondary | ICD-10-CM | POA: Insufficient documentation

## 2012-03-10 DIAGNOSIS — J449 Chronic obstructive pulmonary disease, unspecified: Secondary | ICD-10-CM | POA: Insufficient documentation

## 2012-03-10 DIAGNOSIS — E785 Hyperlipidemia, unspecified: Secondary | ICD-10-CM | POA: Insufficient documentation

## 2012-03-10 DIAGNOSIS — J4489 Other specified chronic obstructive pulmonary disease: Secondary | ICD-10-CM | POA: Insufficient documentation

## 2012-03-10 DIAGNOSIS — N189 Chronic kidney disease, unspecified: Secondary | ICD-10-CM | POA: Insufficient documentation

## 2012-03-10 DIAGNOSIS — N186 End stage renal disease: Secondary | ICD-10-CM

## 2012-03-10 DIAGNOSIS — I871 Compression of vein: Secondary | ICD-10-CM | POA: Insufficient documentation

## 2012-03-10 DIAGNOSIS — E039 Hypothyroidism, unspecified: Secondary | ICD-10-CM | POA: Insufficient documentation

## 2012-03-10 DIAGNOSIS — Y832 Surgical operation with anastomosis, bypass or graft as the cause of abnormal reaction of the patient, or of later complication, without mention of misadventure at the time of the procedure: Secondary | ICD-10-CM | POA: Insufficient documentation

## 2012-03-10 MED ORDER — IOHEXOL 300 MG/ML  SOLN
100.0000 mL | Freq: Once | INTRAMUSCULAR | Status: AC | PRN
  Administered 2012-03-10: 50 mL via INTRAVENOUS

## 2012-03-10 NOTE — Procedures (Signed)
Successful LUE AVF 7mm PTA NO comp Stable Ready for use Full report in PACS

## 2012-03-10 NOTE — H&P (Signed)
[name] is an 71 y.o. female.   Chief Complaint: HTN ESRD, decreased access flow rates, LUE AVF HPI: here for av fistulogram and possible intervention  Past Medical History  Diagnosis Date  . Hypertension   . Hyperlipidemia   . Thyroid disease     Hypothyroidism  . Chronic kidney disease   . Autoimmune thrombocytopenia   . COPD (chronic obstructive pulmonary disease)     Past Surgical History  Procedure Date  . Av fistula placement, brachiocephalic 11/04/2010    right arm    Family History  Problem Relation Age of Onset  . Heart disease Mother   . Cancer Father   . Kidney disease Brother    Social History:  reports that she has quit smoking. Her smoking use included Cigarettes. She does not have any smokeless tobacco history on file. She reports that she does not drink alcohol. Her drug history not on file.  Allergies:  Allergies  Allergen Reactions  . Shellfish-Derived Products     Medications Prior to Admission  Medication Sig Dispense Refill  . amLODipine (NORVASC) 10 MG tablet Take 10 mg by mouth daily.        . B Complex-C-Folic Acid (NEPHRO-VITE PO) Take by mouth as needed.        . calcium acetate (PHOSLO) 667 MG capsule Take 667 mg by mouth 3 (three) times daily with meals.        . cinacalcet (SENSIPAR) 30 MG tablet Take 30 mg by mouth daily.        Marland Kitchen docusate sodium (COLACE) 100 MG capsule Take 100 mg by mouth as needed.        Marland Kitchen Epoetin Alfa (EPOGEN IJ) Inject as directed every hemodialysis.        . Fluticasone-Salmeterol (ADVAIR DISKUS IN) Inhale into the lungs.        . Ipratropium-Albuterol (COMBIVENT IN) Inhale 2 puffs into the lungs every 6 (six) hours.        Marland Kitchen levothyroxine (SYNTHROID, LEVOTHROID) 125 MCG tablet Take 125 mcg by mouth daily.        . sevelamer (RENVELA) 800 MG tablet Take 240 mg by mouth 3 (three) times daily with meals.         No current facility-administered medications on file as of 03/10/2012.    No results found for  this or any previous visit (from the past 48 hour(s)). No results found.  Review of Systems  Constitutional: Negative for fever, chills and weight loss.  Respiratory: Negative for cough, shortness of breath and wheezing.   Cardiovascular: Negative for chest pain.  Gastrointestinal: Negative for abdominal pain.  Neurological: Negative for headaches.    There were no vitals taken for this visit. Physical Exam  Constitutional: She is oriented to person, place, and time. She appears well-developed and well-nourished. No distress.  Cardiovascular: Normal rate, regular rhythm and normal heart sounds.  Exam reveals no friction rub.   No murmur heard. Respiratory: Effort normal and breath sounds normal.  GI: Soft. Bowel sounds are normal. She exhibits no distension. There is no tenderness. There is no rebound.  Musculoskeletal: She exhibits no edema.  Neurological: She is alert and oriented to person, place, and time.  Skin: She is not diaphoretic.  Psychiatric: She has a normal mood and affect.     Assessment/Plan: AV fistulogram with pta and stent if needed.  Tamber Burtch T. 03/10/2012, 8:12 AM

## 2012-03-13 ENCOUNTER — Telehealth (HOSPITAL_COMMUNITY): Payer: Self-pay | Admitting: Radiology

## 2012-09-17 ENCOUNTER — Inpatient Hospital Stay (HOSPITAL_COMMUNITY)
Admission: EM | Admit: 2012-09-17 | Discharge: 2012-09-20 | DRG: 813 | Disposition: A | Discharge status: Home / Self Care | Payer: Medicare Other | Attending: Internal Medicine | Admitting: Internal Medicine

## 2012-09-17 ENCOUNTER — Encounter (HOSPITAL_COMMUNITY): Payer: Self-pay | Admitting: Nurse Practitioner

## 2012-09-17 ENCOUNTER — Emergency Department (HOSPITAL_COMMUNITY): Payer: Medicare Other

## 2012-09-17 DIAGNOSIS — N186 End stage renal disease: Secondary | ICD-10-CM | POA: Diagnosis present

## 2012-09-17 DIAGNOSIS — D6941 Evans syndrome: Principal | ICD-10-CM | POA: Diagnosis present

## 2012-09-17 DIAGNOSIS — J4489 Other specified chronic obstructive pulmonary disease: Secondary | ICD-10-CM | POA: Diagnosis present

## 2012-09-17 DIAGNOSIS — J90 Pleural effusion, not elsewhere classified: Secondary | ICD-10-CM

## 2012-09-17 DIAGNOSIS — J449 Chronic obstructive pulmonary disease, unspecified: Secondary | ICD-10-CM | POA: Diagnosis present

## 2012-09-17 DIAGNOSIS — E039 Hypothyroidism, unspecified: Secondary | ICD-10-CM | POA: Diagnosis present

## 2012-09-17 DIAGNOSIS — R112 Nausea with vomiting, unspecified: Secondary | ICD-10-CM | POA: Diagnosis present

## 2012-09-17 DIAGNOSIS — Z992 Dependence on renal dialysis: Secondary | ICD-10-CM

## 2012-09-17 DIAGNOSIS — D649 Anemia, unspecified: Secondary | ICD-10-CM | POA: Diagnosis present

## 2012-09-17 DIAGNOSIS — I10 Essential (primary) hypertension: Secondary | ICD-10-CM | POA: Diagnosis present

## 2012-09-17 DIAGNOSIS — E785 Hyperlipidemia, unspecified: Secondary | ICD-10-CM | POA: Diagnosis present

## 2012-09-17 DIAGNOSIS — Z87891 Personal history of nicotine dependence: Secondary | ICD-10-CM

## 2012-09-17 DIAGNOSIS — I120 Hypertensive chronic kidney disease with stage 5 chronic kidney disease or end stage renal disease: Secondary | ICD-10-CM | POA: Diagnosis present

## 2012-09-17 LAB — OCCULT BLOOD, POC DEVICE: Fecal Occult Bld: NEGATIVE

## 2012-09-17 LAB — CBC WITH DIFFERENTIAL/PLATELET
Eosinophils Absolute: 0.1 10*3/uL (ref 0.0–0.7)
Eosinophils Relative: 2 % (ref 0–5)
Lymphs Abs: 0.8 10*3/uL (ref 0.7–4.0)
MCH: 31.1 pg (ref 26.0–34.0)
MCV: 97.8 fL (ref 78.0–100.0)
Monocytes Absolute: 0.2 10*3/uL (ref 0.1–1.0)
Platelets: 152 10*3/uL (ref 150–400)
RBC: 2.25 MIL/uL — ABNORMAL LOW (ref 3.87–5.11)

## 2012-09-17 LAB — PREPARE RBC (CROSSMATCH)

## 2012-09-17 MED ORDER — SODIUM CHLORIDE 0.9 % IV SOLN: INTRAVENOUS | Status: DC

## 2012-09-17 MED ORDER — SODIUM CHLORIDE 0.9 % IV SOLN
INTRAVENOUS | Status: DC
  Administered 2012-09-17: 22:00:00 via INTRAVENOUS

## 2012-09-17 NOTE — ED Notes (Addendum)
Per ems: pt c/o not feeling well since yesterday and increasingly SOB and nauseated today. Reports unable to tolerate oral intake. 100% O2 sats on 2L [place] en route. C/o center of chest pain and abd pain. VSS. Pt is t/th/sat dialysis did complete full course yesterday. Pt received flu shot tuesday

## 2012-09-17 NOTE — ED Provider Notes (Addendum)
History     CSN: [id number]  Arrival date & time 09/17/12  4098   First MD Initiated Contact with Patient 09/17/12 2055      Chief Complaint  Patient presents with  . Shortness of Breath  . Nausea  . Emesis    (Consider location/radiation/quality/duration/timing/severity/associated sxs/prior treatment) Patient is a 71 y.o. female presenting with shortness of breath and vomiting. The history is provided by the patient.  Shortness of Breath  Associated symptoms include chest pain, cough and shortness of breath. Pertinent negatives include no fever.  Emesis  Associated symptoms include cough. Pertinent negatives include no abdominal pain, no chills, no diarrhea, no fever and no headaches.   71 year old, female, with end-stage renal disease.  That gets dialysis presents to emergency department complaining of shortness of breath with productive cough, and intermittent chest pain.  She states the pain, only occurs with her cough.  She has had nausea, vomiting, but no diarrhea.  No fevers, chills, or sweating.  She does not have chest pain.  Now.  She does not use oxygen all the time.  She has not missed.  Her dialysis  Past Medical History  Diagnosis Date  . Hypertension   . Hyperlipidemia   . Thyroid disease     Hypothyroidism  . Chronic kidney disease   . Autoimmune thrombocytopenia   . COPD (chronic obstructive pulmonary disease)     Past Surgical History  Procedure Date  . Av fistula placement, brachiocephalic 11/04/2010    right arm    Family History  Problem Relation Age of Onset  . Heart disease Mother   . Cancer Father   . Kidney disease Brother     History  Substance Use Topics  . Smoking status: Former Smoker    Types: Cigarettes  . Smokeless tobacco: Not on file  . Alcohol Use: No    OB History    Grav Para Term Preterm Abortions TAB SAB Ect Mult Living                  Review of Systems  Constitutional: Negative for fever, chills and diaphoresis.    Respiratory: Positive for cough and shortness of breath. Negative for chest tightness.   Cardiovascular: Positive for chest pain. Negative for leg swelling.       Chest pain, only with cough  Gastrointestinal: Positive for vomiting. Negative for abdominal pain and diarrhea.  Neurological: Negative for headaches.  Psychiatric/Behavioral: Negative for confusion.  All other systems reviewed and are negative.    Allergies  Shellfish-derived products  Home Medications   Current Outpatient Rx  Name Route Sig Dispense Refill  . IPRATROPIUM-ALBUTEROL 18-103 MCG/ACT IN AERO Inhalation Inhale 2 puffs into the lungs 2 (two) times daily as needed. For wheezing    . AMLODIPINE BESYLATE 10 MG PO TABS Oral Take 10 mg by mouth at bedtime.     Marland Kitchen NEPHRO-VITE PO Oral Take 1 tablet by mouth at bedtime.     Marland Kitchen CALCIUM ACETATE 667 MG PO CAPS Oral Take 667 mg by mouth 3 (three) times daily with meals.      Marland Kitchen CINACALCET HCL 30 MG PO TABS Oral Take 30 mg by mouth at bedtime.     Marland Kitchen DIPHENHYDRAMINE HCL 25 MG PO CAPS Oral Take 25 mg by mouth every 6 (six) hours as needed. For allergies/sleep    . ADVAIR DISKUS IN Inhalation Inhale 1-2 puffs into the lungs daily as needed. For wheezing    . ISOSORBIDE MONONITRATE ER  60 MG PO TB24 Oral Take 60 mg by mouth at bedtime.    Marland Kitchen LEVOTHYROXINE SODIUM 125 MCG PO TABS Oral Take 125 mcg by mouth at bedtime.     Marland Kitchen SEVELAMER CARBONATE 800 MG PO TABS Oral Take 240 mg by mouth 3 (three) times daily with meals.        BP 144/59  Pulse 79  Temp 98.1 F (36.7 C) (Oral)  Resp 20  SpO2 100%  Physical Exam  Nursing note and vitals reviewed. Constitutional: She is oriented to person, place, and time. No distress.       Morbidly obese, speaking in full symptoms.  Without dyspnea  HENT:  Head: Normocephalic and atraumatic.  Eyes: Conjunctivae normal and EOM are normal.  Neck: Normal range of motion. Neck supple.  Cardiovascular: Normal rate, regular rhythm and intact distal  pulses.   No murmur heard. Pulmonary/Chest: Effort normal and breath sounds normal. No respiratory distress. She has no wheezes. She has no rales.  Abdominal: Soft. Bowel sounds are normal. She exhibits no distension. There is no tenderness.  Genitourinary: Guaiac negative stool.       Rectal examination performed with nurse chaperone, brown stool  Musculoskeletal: Normal range of motion. She exhibits no edema and no tenderness.  Neurological: She is alert and oriented to person, place, and time. No cranial nerve deficit.  Skin: Skin is warm and dry.  Psychiatric: She has a normal mood and affect. Thought content normal.    ED Course  Procedures (including critical care time)   Labs Reviewed  CBC WITH DIFFERENTIAL   No results found.   No diagnosis found.  ECG Normal sinus rhythm at 75 beats per minute Normal axis. Prolonged QT interval. Normal.  ST and T waves  MDM  Productive cough with shortness of breath, and intermittent chest pain, which only occurs with cough        Cheri Guppy, MD 09/17/12 2128  Cheri Guppy, MD 09/18/12 1610

## 2012-09-18 ENCOUNTER — Inpatient Hospital Stay (HOSPITAL_COMMUNITY): Payer: Medicare Other

## 2012-09-18 DIAGNOSIS — N186 End stage renal disease: Secondary | ICD-10-CM

## 2012-09-18 DIAGNOSIS — D649 Anemia, unspecified: Secondary | ICD-10-CM

## 2012-09-18 DIAGNOSIS — D509 Iron deficiency anemia, unspecified: Secondary | ICD-10-CM

## 2012-09-18 DIAGNOSIS — I10 Essential (primary) hypertension: Secondary | ICD-10-CM

## 2012-09-18 DIAGNOSIS — J449 Chronic obstructive pulmonary disease, unspecified: Secondary | ICD-10-CM

## 2012-09-18 LAB — CBC WITH DIFFERENTIAL/PLATELET
Basophils Relative: 1 % (ref 0–1)
Eosinophils Absolute: 0.2 10*3/uL (ref 0.0–0.7)
HCT: 32.3 % — ABNORMAL LOW (ref 36.0–46.0)
Hemoglobin: 10.3 g/dL — ABNORMAL LOW (ref 12.0–15.0)
MCH: 30.2 pg (ref 26.0–34.0)
MCHC: 31.9 g/dL (ref 30.0–36.0)
Monocytes Absolute: 0.3 10*3/uL (ref 0.1–1.0)
Monocytes Relative: 5 % (ref 3–12)
RDW: 17.7 % — ABNORMAL HIGH (ref 11.5–15.5)

## 2012-09-18 LAB — COMPREHENSIVE METABOLIC PANEL
ALT: 6 U/L (ref 0–35)
ALT: 6 U/L (ref 0–35)
AST: 11 U/L (ref 0–37)
Alkaline Phosphatase: 51 U/L (ref 39–117)
Alkaline Phosphatase: 65 U/L (ref 39–117)
BUN: 11 mg/dL (ref 6–23)
CO2: 31 mEq/L (ref 19–32)
CO2: 35 mEq/L — ABNORMAL HIGH (ref 19–32)
Calcium: 8.5 mg/dL (ref 8.4–10.5)
Calcium: 9.3 mg/dL (ref 8.4–10.5)
GFR calc Af Amer: 9 mL/min — ABNORMAL LOW (ref >90)
GFR calc non Af Amer: 6 mL/min — ABNORMAL LOW (ref >90)
GFR calc non Af Amer: 7 mL/min — ABNORMAL LOW (ref >90)
Glucose, Bld: 85 mg/dL (ref 70–99)
Potassium: 4 mEq/L (ref 3.5–5.1)
Sodium: 143 mEq/L (ref 135–145)
Total Protein: 5.7 g/dL — ABNORMAL LOW (ref 6.0–8.3)
Total Protein: 6.6 g/dL (ref 6.0–8.3)

## 2012-09-18 LAB — LACTATE DEHYDROGENASE: LDH: 145 U/L (ref 94–250)

## 2012-09-18 LAB — LIPASE, BLOOD: Lipase: 36 U/L (ref 11–59)

## 2012-09-18 LAB — RETICULOCYTES: Retic Count, Absolute: 125.4 10*3/uL (ref 19.0–186.0)

## 2012-09-18 LAB — TROPONIN I: Troponin I: <0.3 ng/mL (ref <0.30)

## 2012-09-18 MED ORDER — ALBUTEROL SULFATE (5 MG/ML) 0.5% IN NEBU: 2.5000 mg | INHALATION_SOLUTION | RESPIRATORY_TRACT | Status: DC | PRN

## 2012-09-18 MED ORDER — CINACALCET HCL 30 MG PO TABS
30.0000 mg | ORAL_TABLET | Freq: Every day | ORAL | Status: DC
  Administered 2012-09-18 – 2012-09-19 (×3): 30 mg via ORAL
  Filled 2012-09-18 (×4): qty 1

## 2012-09-18 MED ORDER — DIPHENHYDRAMINE HCL 25 MG PO CAPS: 25.0000 mg | ORAL_CAPSULE | Freq: Four times a day (QID) | ORAL | Status: DC | PRN

## 2012-09-18 MED ORDER — ONDANSETRON HCL 4 MG PO TABS
4.0000 mg | ORAL_TABLET | Freq: Four times a day (QID) | ORAL | Status: DC | PRN
  Administered 2012-09-18: 4 mg via ORAL
  Filled 2012-09-18: qty 1

## 2012-09-18 MED ORDER — ISOSORBIDE MONONITRATE ER 60 MG PO TB24
60.0000 mg | ORAL_TABLET | Freq: Every day | ORAL | Status: DC
  Administered 2012-09-18 – 2012-09-19 (×3): 60 mg via ORAL
  Filled 2012-09-18 (×4): qty 1

## 2012-09-18 MED ORDER — SODIUM CHLORIDE 0.9 % IJ SOLN
3.0000 mL | Freq: Two times a day (BID) | INTRAMUSCULAR | Status: DC
  Administered 2012-09-18 – 2012-09-20 (×6): 3 mL via INTRAVENOUS

## 2012-09-18 MED ORDER — MUPIROCIN 2 % EX OINT
1.0000 "application " | TOPICAL_OINTMENT | Freq: Two times a day (BID) | CUTANEOUS | Status: DC
  Administered 2012-09-18 – 2012-09-20 (×5): 1 via NASAL
  Filled 2012-09-18: qty 22

## 2012-09-18 MED ORDER — CHLORHEXIDINE GLUCONATE CLOTH 2 % EX PADS
6.0000 | MEDICATED_PAD | Freq: Every day | CUTANEOUS | Status: DC
  Administered 2012-09-18 – 2012-09-20 (×3): 6 via TOPICAL

## 2012-09-18 MED ORDER — ACETAMINOPHEN 325 MG PO TABS: 650.0000 mg | ORAL_TABLET | Freq: Four times a day (QID) | ORAL | Status: DC | PRN

## 2012-09-18 MED ORDER — AMLODIPINE BESYLATE 10 MG PO TABS
10.0000 mg | ORAL_TABLET | Freq: Every day | ORAL | Status: DC
  Administered 2012-09-18 – 2012-09-19 (×3): 10 mg via ORAL
  Filled 2012-09-18 (×4): qty 1

## 2012-09-18 MED ORDER — ACETAMINOPHEN 650 MG RE SUPP: 650.0000 mg | Freq: Four times a day (QID) | RECTAL | Status: DC | PRN

## 2012-09-18 MED ORDER — GUAIFENESIN-DM 100-10 MG/5ML PO SYRP
5.0000 mL | ORAL_SOLUTION | ORAL | Status: DC | PRN
  Administered 2012-09-18: 5 mL via ORAL
  Filled 2012-09-18 (×2): qty 5

## 2012-09-18 MED ORDER — SEVELAMER CARBONATE 800 MG PO TABS
800.0000 mg | ORAL_TABLET | Freq: Three times a day (TID) | ORAL | Status: DC
  Administered 2012-09-18 – 2012-09-20 (×5): 800 mg via ORAL
  Filled 2012-09-18 (×10): qty 1

## 2012-09-18 MED ORDER — SODIUM CHLORIDE 0.9 % IJ SOLN: 3.0000 mL | Freq: Two times a day (BID) | INTRAMUSCULAR | Status: DC

## 2012-09-18 MED ORDER — PARICALCITOL 5 MCG/ML IV SOLN
INTRAVENOUS | Status: AC
  Administered 2012-09-18: 5 ug via INTRAVENOUS
  Filled 2012-09-18: qty 1

## 2012-09-18 MED ORDER — PARICALCITOL 5 MCG/ML IV SOLN
5.0000 ug | INTRAVENOUS | Status: DC
  Administered 2012-09-18: 5 ug via INTRAVENOUS
  Filled 2012-09-18 (×2): qty 1

## 2012-09-18 MED ORDER — BIOTENE DRY MOUTH MT LIQD
15.0000 mL | Freq: Two times a day (BID) | OROMUCOSAL | Status: DC
  Administered 2012-09-18 – 2012-09-20 (×5): 15 mL via OROMUCOSAL

## 2012-09-18 MED ORDER — IPRATROPIUM-ALBUTEROL 18-103 MCG/ACT IN AERO: 2.0000 | INHALATION_SPRAY | Freq: Two times a day (BID) | RESPIRATORY_TRACT | Status: DC | PRN

## 2012-09-18 MED ORDER — CALCIUM ACETATE 667 MG PO CAPS
667.0000 mg | ORAL_CAPSULE | Freq: Three times a day (TID) | ORAL | Status: DC
  Administered 2012-09-18 – 2012-09-20 (×5): 667 mg via ORAL
  Filled 2012-09-18 (×10): qty 1

## 2012-09-18 MED ORDER — FLUTICASONE-SALMETEROL 250-50 MCG/DOSE IN AEPB
1.0000 | INHALATION_SPRAY | Freq: Two times a day (BID) | RESPIRATORY_TRACT | Status: DC
  Administered 2012-09-18 – 2012-09-20 (×5): 1 via RESPIRATORY_TRACT
  Filled 2012-09-18: qty 14

## 2012-09-18 MED ORDER — LEVOTHYROXINE SODIUM 125 MCG PO TABS
125.0000 ug | ORAL_TABLET | Freq: Every day | ORAL | Status: DC
  Administered 2012-09-18 – 2012-09-19 (×3): 125 ug via ORAL
  Filled 2012-09-18 (×4): qty 1

## 2012-09-18 MED ORDER — ONDANSETRON HCL 4 MG/2ML IJ SOLN
4.0000 mg | Freq: Four times a day (QID) | INTRAMUSCULAR | Status: DC | PRN
  Administered 2012-09-20 (×2): 4 mg via INTRAVENOUS
  Filled 2012-09-18: qty 2

## 2012-09-18 NOTE — Progress Notes (Signed)
Patient complain of dry cough. Benedetto Coons, PA notified. Will give robitussin prn per order. Steele Berg RN

## 2012-09-18 NOTE — H&P (Addendum)
[name] is an 71 y.o. female.   Patient was seen and examined on September 18, 2012 at 12 AM. PCP/nephrologist - Dr. Darrick Penna. Chief Complaint:  Shortness of breath nausea and vomiting. HPI:  71 year-old female with known history of ESRD on hemodialysis on Tuesday Thursdays and Saturday has been experiencing shortness of breath since this afternoon. Patient is short of breath at rest. Denies any fever chills productive cough chest pain. Patient has been having some nausea vomiting for the last 4-5 days, after her flu shot. Denies any abdominal pain or diarrhea. In the ER patient had chest x-ray did not show any acute. Her hemoglobin was found to be around 7 and her initial hemoglobin on i-STAT was around 6.5. Patient's hemoglobin last month was 13. Patient otherwise is not acutely short of breath at this time and denies any chest pain. Patient has been admitted for severe anemia. Patient denies any blood in the vomitus and has not noticed any black stools or use any NSAIDs. 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 Patient states she has gone to hematologist previously for this many years ago.  Past Medical History  Diagnosis Date  . Hypertension   . Hyperlipidemia   . Thyroid disease     Hypothyroidism  . Chronic kidney disease   . Autoimmune thrombocytopenia   . COPD (chronic obstructive pulmonary disease)     Past Surgical History  Procedure Date  . Av fistula placement, brachiocephalic 11/04/2010    right arm  . Abdominal hysterectomy     Family History  Problem Relation Age of Onset  . Heart disease Mother   . Cancer Father   . Kidney disease Brother    Social History:  reports that she has quit smoking. Her smoking use included Cigarettes. She does not have any smokeless tobacco history on file. She reports that she does not drink alcohol. Her drug history not on file.  Allergies:  Allergies  Allergen Reactions  . Shellfish-Derived  Products Anaphylaxis     (Not in a hospital admission)  Results for orders placed during the hospital encounter of 09/17/12 (from the past 48 hour(s))  CBC WITH DIFFERENTIAL     Status: Abnormal   Collection Time   09/17/12  9:51 PM      Component Value Range Comment   WBC 4.2  4.0 - 10.5 K/uL    RBC 2.25 (*) 3.87 - 5.11 MIL/uL    Hemoglobin 7.0 (*) 12.0 - 15.0 g/dL    HCT 78.2 (*) 95.6 - 46.0 %    MCV 97.8  78.0 - 100.0 fL    MCH 31.1  26.0 - 34.0 pg    MCHC 31.8  30.0 - 36.0 g/dL    RDW 21.3  08.6 - 57.8 %    Platelets 152  150 - 400 K/uL    Neutrophils Relative 76  43 - 77 %    Neutro Abs 3.2  1.7 - 7.7 K/uL    Lymphocytes Relative 18  12 - 46 %    Lymphs Abs 0.8  0.7 - 4.0 K/uL    Monocytes Relative 4  3 - 12 %    Monocytes Absolute 0.2  0.1 - 1.0 K/uL    Eosinophils Relative 2  0 - 5 %    Eosinophils Absolute 0.1  0.0 - 0.7 K/uL    Basophils Relative 1  0 - 1 %    Basophils Absolute 0.0  0.0 - 0.1  K/uL   OCCULT BLOOD, POC DEVICE     Status: Normal   Collection Time   09/17/12 10:33 PM      Component Value Range Comment   Fecal Occult Bld NEGATIVE     TYPE AND SCREEN     Status: Normal (Preliminary result)   Collection Time   09/17/12 10:49 PM      Component Value Range Comment   ABO/RH(D) AB POS      Antibody Screen NEG      Sample Expiration 09/20/2012      Unit Number [id number]      Blood Component Type RED CELLS,LR      Unit division 00      Status of Unit ALLOCATED      Transfusion Status OK TO TRANSFUSE      Crossmatch Result Compatible      Unit Number [id number]      Blood Component Type RED CELLS,LR      Unit division 00      Status of Unit ISSUED      Transfusion Status OK TO TRANSFUSE      Crossmatch Result Compatible     PREPARE RBC (CROSSMATCH)     Status: Normal   Collection Time   09/17/12 11:00 PM      Component Value Range Comment   Order Confirmation ORDER PROCESSED BY BLOOD BANK      Dg Chest Port 1 View  09/17/2012  *RADIOLOGY  REPORT*  Clinical Data: 71 year old female chest pain and shortness of breath.  Chronic renal disease and hypertension.  PORTABLE CHEST - 1 VIEW  Comparison: 05/12/2011 and earlier.  Findings: Portable semi upright AP view 2131 hours.  Right IJ dialysis catheter no longer present.  Small bilateral pleural effusions are increased from prior.  Slightly lower lung volumes. Stable cardiomegaly and mediastinal contours.  Increased interstitial markings probably related to vascular congestion/edema.  No pneumothorax.  No definite consolidation.  IMPRESSION: Interstitial edema and increased chronic small effusions.   Original Report Authenticated By: Harley Hallmark, M.D.     Review of Systems  Constitutional: Negative.   HENT: Negative.   Eyes: Negative.   Respiratory: Positive for shortness of breath.   Cardiovascular: Negative.   Gastrointestinal: Positive for nausea and vomiting.  Genitourinary: Negative.   Musculoskeletal: Negative.   Skin: Negative.   Neurological: Negative.   Endo/Heme/Allergies: Negative.   Psychiatric/Behavioral: Negative.     Blood pressure 166/63, pulse 83, temperature 98.1 F (36.7 C), temperature source Oral, resp. rate 16, SpO2 95.00%. Physical Exam  Constitutional: She is oriented to person, place, and time. She appears well-developed and well-nourished. No distress.  HENT:  Head: Normocephalic and atraumatic.  Right Ear: External ear normal.  Left Ear: External ear normal.  Nose: Nose normal.  Mouth/Throat: Oropharynx is clear and moist. No oropharyngeal exudate.  Eyes: Pupils are equal, round, and reactive to light. Right eye exhibits no discharge. Left eye exhibits no discharge. No scleral icterus.       Pallor.  Neck: Normal range of motion. Neck supple.  Cardiovascular: Normal rate and regular rhythm.   Respiratory: Effort normal and breath sounds normal. No respiratory distress. She has no wheezes. She has no rales.  GI: Soft. Bowel sounds are normal.  She exhibits no distension. There is no tenderness. There is no rebound.  Musculoskeletal: Normal range of motion. She exhibits no edema and no tenderness.  Neurological: She is alert and oriented to person, place, and time.  Moves all extremities.  Skin: Skin is dry. She is not diaphoretic.  Psychiatric: Her behavior is normal.     Assessment/Plan #1. Severe anemia in a patient with known history of autoimmune hemolytic anemia - patient's stool for occult blood is negative. Patient's most likely cause for the anemia is autoimmune hemolytic anemia. At this time I have ordered LDH haptoglobin levels. Patient has been already ordered 2 units packed red blood cells which we will continue. Recheck CBC in a.m. If LDH levels are high will need to consult hematologist. #2. Nausea and vomiting - presently her abdominal exam is benign. Her i-STAT labs as per the ER physician was unremarkable. I have ordered complete metabolic panel including LFT in lipase which are pending. If her nausea vomiting persist towards her LFTs are abnormal we will order further radiological tests. #3. Shortness of breath - presently patient is not short of breath.Chest x-ray shows edema pattern but patient is not in distress. Patient is saturating well on room air. Her anemia may be contributing to her shortness of breath.Will check troponin continue to monitor in telemetry. Continue home medications. . Patient does not look fluid overload. #4. History of spontaneous pneumothorax requiring pleurodesis. #5. ESRD on hemodialysis on Tuesday Thursdays and Saturday - please notify nephrologist for dialysis. #6. Hypertension - continue present medications. #7. COPD - presently not wheezing continuing inhaler.  CODE STATUS - full code.  Meredith Raczynski N. 09/18/2012, 12:02 AM

## 2012-09-18 NOTE — Consult Note (Signed)
[facility] Renal Consultation Note  Indication for Consultation:  Management of ESRD/hemodialysis; anemia, hypertension/volume and secondary hyperparathyroidism  HPI: [name] is a 71 y.o. female with ESRD on hemodialysis on TTS at the [facility] who has had nausea and vomiting for the last five days after receiving her flu shot on 9/17, but called EMS yesterday after experiencing shortness of breath and mild chest pain.  In the ED her hemoglobin was 7, and she was given two units of PRBCs.  Her chest x-ray had interstitial edema and increased chronic small effusions, but today has CHF with bilateral effusions and bibasilar atelectasis.  Her breathing is more labored today, and her O2 sat improved from 77% to 92% when her oxygen was increased from 2 L to 5 L.  She reports no chest pain, nausea, or vomiting since yesterday.  She has not had a bowel movement since 9/21, when she was given Imodium at dialysis for diarrhea.    Dialysis Orders: Center: [facility] on TTS. EDW 80.5 kg   HD Bath 2K/2.25Ca  Time 3 hrs 45 mins  Heparin 4000 U. Access AVF @ LUA   BFR 450 DFR 800    Zemplar 5 mcg IV/HD  Epogen 19,800 Units IV/HD  Venofer 0.  Past Medical History  Diagnosis Date  . Hypertension   . Hyperlipidemia   . Thyroid disease     Hypothyroidism  . Chronic kidney disease   . Autoimmune thrombocytopenia   . COPD (chronic obstructive pulmonary disease)    Past Surgical History  Procedure Date  . Av fistula placement, brachiocephalic 11/04/2010    right arm  . Abdominal hysterectomy    Family History  Problem Relation Age of Onset  . Heart disease Mother   . Cancer Father   . Kidney disease Brother    Social History She reports that she has quit smoking cigarettes 20 years ago after using about 1/2 pack a day, but she denies any history of smokeless tobacco, alcohol, or illicit drug use.  She previously did housework for a living and currently lives with her  husband.  They have four grown children.  Allergies  Allergen Reactions  . Shellfish-Derived Products Anaphylaxis   Prior to Admission medications   Medication Sig Start Date End Date Taking? Authorizing Provider  albuterol-ipratropium (COMBIVENT) 18-103 MCG/ACT inhaler Inhale 2 puffs into the lungs 2 (two) times daily as needed. For wheezing   Yes Historical Provider, MD  amLODipine (NORVASC) 10 MG tablet Take 10 mg by mouth at bedtime.    Yes Historical Provider, MD  B Complex-C-Folic Acid (NEPHRO-VITE PO) Take 1 tablet by mouth at bedtime.    Yes Historical Provider, MD  calcium acetate (PHOSLO) 667 MG capsule Take 667 mg by mouth 3 (three) times daily with meals.     Yes Historical Provider, MD  cinacalcet (SENSIPAR) 30 MG tablet Take 30 mg by mouth at bedtime.    Yes Historical Provider, MD  diphenhydrAMINE (BENADRYL) 25 mg capsule Take 25 mg by mouth every 6 (six) hours as needed. For allergies/sleep   Yes Historical Provider, MD  Fluticasone-Salmeterol (ADVAIR DISKUS IN) Inhale 1-2 puffs into the lungs daily as needed. For wheezing   Yes Historical Provider, MD  isosorbide mononitrate (IMDUR) 60 MG 24 hr tablet Take 60 mg by mouth at bedtime.   Yes Historical Provider, MD  levothyroxine (SYNTHROID, LEVOTHROID) 125 MCG tablet Take 125 mcg by mouth at bedtime.    Yes Historical Provider, MD  sevelamer (  RENVELA) 800 MG tablet Take 240 mg by mouth 3 (three) times daily with meals.     Yes Historical Provider, MD   Labs:  Results for orders placed during the hospital encounter of 09/17/12 (from the past 48 hour(s))  CBC WITH DIFFERENTIAL     Status: Abnormal   Collection Time   09/17/12  9:51 PM      Component Value Range Comment   WBC 4.2  4.0 - 10.5 K/uL    RBC 2.25 (*) 3.87 - 5.11 MIL/uL    Hemoglobin 7.0 (*) 12.0 - 15.0 g/dL    HCT 16.1 (*) 09.6 - 46.0 %    MCV 97.8  78.0 - 100.0 fL    MCH 31.1  26.0 - 34.0 pg    MCHC 31.8  30.0 - 36.0 g/dL    RDW 04.5  40.9 - 81.1 %     Platelets 152  150 - 400 K/uL    Neutrophils Relative 76  43 - 77 %    Neutro Abs 3.2  1.7 - 7.7 K/uL    Lymphocytes Relative 18  12 - 46 %    Lymphs Abs 0.8  0.7 - 4.0 K/uL    Monocytes Relative 4  3 - 12 %    Monocytes Absolute 0.2  0.1 - 1.0 K/uL    Eosinophils Relative 2  0 - 5 %    Eosinophils Absolute 0.1  0.0 - 0.7 K/uL    Basophils Relative 1  0 - 1 %    Basophils Absolute 0.0  0.0 - 0.1 K/uL   OCCULT BLOOD, POC DEVICE     Status: Normal   Collection Time   09/17/12 10:33 PM      Component Value Range Comment   Fecal Occult Bld NEGATIVE     TYPE AND SCREEN     Status: Normal (Preliminary result)   Collection Time   09/17/12 10:49 PM      Component Value Range Comment   ABO/RH(D) AB POS      Antibody Screen NEG      Sample Expiration 09/20/2012      Unit Number [id number]      Blood Component Type RED CELLS,LR      Unit division 00      Status of Unit ISSUED      Transfusion Status OK TO TRANSFUSE      Crossmatch Result Compatible      Unit Number [id number]      Blood Component Type RED CELLS,LR      Unit division 00      Status of Unit ISSUED      Transfusion Status OK TO TRANSFUSE      Crossmatch Result Compatible     PREPARE RBC (CROSSMATCH)     Status: Normal   Collection Time   09/17/12 11:00 PM      Component Value Range Comment   Order Confirmation ORDER PROCESSED BY BLOOD BANK     COMPREHENSIVE METABOLIC PANEL     Status: Abnormal   Collection Time   09/17/12 11:30 PM      Component Value Range Comment   Sodium 141  135 - 145 mEq/L    Potassium 3.0 (*) 3.5 - 5.1 mEq/L    Chloride 100  96 - 112 mEq/L    CO2 31  19 - 32 mEq/L    Glucose, Bld 85  70 - 99 mg/dL    BUN 11  6 - 23 mg/dL  Creatinine, Ser 5.24 (*) 0.50 - 1.10 mg/dL    Calcium 8.5  8.4 - 16.1 mg/dL    Total Protein 5.7 (*) 6.0 - 8.3 g/dL    Albumin 2.7 (*) 3.5 - 5.2 g/dL    AST 9  0 - 37 U/L    ALT 6  0 - 35 U/L    Alkaline Phosphatase 51  39 - 117 U/L    Total Bilirubin 0.5  0.3 -  1.2 mg/dL    GFR calc non Af Amer 7 (*) >90 mL/min    GFR calc Af Amer 9 (*) >90 mL/min   LACTATE DEHYDROGENASE     Status: Normal   Collection Time   09/17/12 11:30 PM      Component Value Range Comment   LDH 145  94 - 250 U/L   LIPASE, BLOOD     Status: Normal   Collection Time   09/17/12 11:30 PM      Component Value Range Comment   Lipase 36  11 - 59 U/L   TROPONIN I     Status: Normal   Collection Time   09/18/12 12:18 AM      Component Value Range Comment   Troponin I <0.30  <0.30 ng/mL   MRSA PCR SCREENING     Status: Abnormal   Collection Time   09/18/12  2:09 AM      Component Value Range Comment   MRSA by PCR POSITIVE (*) NEGATIVE   TROPONIN I     Status: Normal   Collection Time   09/18/12  6:20 AM      Component Value Range Comment   Troponin I <0.30  <0.30 ng/mL   COMPREHENSIVE METABOLIC PANEL     Status: Abnormal   Collection Time   09/18/12  6:20 AM      Component Value Range Comment   Sodium 143  135 - 145 mEq/L    Potassium 4.0  3.5 - 5.1 mEq/L    Chloride 99  96 - 112 mEq/L    CO2 35 (*) 19 - 32 mEq/L    Glucose, Bld 101 (*) 70 - 99 mg/dL    BUN 15  6 - 23 mg/dL    Creatinine, Ser 0.96 (*) 0.50 - 1.10 mg/dL    Calcium 9.3  8.4 - 04.5 mg/dL    Total Protein 6.6  6.0 - 8.3 g/dL    Albumin 3.1 (*) 3.5 - 5.2 g/dL    AST 11  0 - 37 U/L    ALT 6  0 - 35 U/L    Alkaline Phosphatase 65  39 - 117 U/L    Total Bilirubin 1.7 (*) 0.3 - 1.2 mg/dL    GFR calc non Af Amer 6 (*) >90 mL/min    GFR calc Af Amer 7 (*) >90 mL/min   CBC WITH DIFFERENTIAL     Status: Abnormal   Collection Time   09/18/12  6:20 AM      Component Value Range Comment   WBC 6.8  4.0 - 10.5 K/uL    RBC 3.41 (*) 3.87 - 5.11 MIL/uL    Hemoglobin 10.3 (*) 12.0 - 15.0 g/dL    HCT 40.9 (*) 81.1 - 46.0 %    MCV 94.7  78.0 - 100.0 fL    MCH 30.2  26.0 - 34.0 pg    MCHC 31.9  30.0 - 36.0 g/dL    RDW 91.4 (*) 78.2 - 15.5 %    Platelets  169  150 - 400 K/uL    Neutrophils Relative 78 (*) 43 - 77 %      Neutro Abs 5.4  1.7 - 7.7 K/uL    Lymphocytes Relative 14  12 - 46 %    Lymphs Abs 0.9  0.7 - 4.0 K/uL    Monocytes Relative 5  3 - 12 %    Monocytes Absolute 0.3  0.1 - 1.0 K/uL    Eosinophils Relative 2  0 - 5 %    Eosinophils Absolute 0.2  0.0 - 0.7 K/uL    Basophils Relative 1  0 - 1 %    Basophils Absolute 0.0  0.0 - 0.1 K/uL   PHOSPHORUS     Status: Abnormal   Collection Time   09/18/12  6:20 AM      Component Value Range Comment   Phosphorus 5.2 (*) 2.3 - 4.6 mg/dL   SAVE SMEAR     Status: Normal   Collection Time   09/18/12  9:07 AM      Component Value Range Comment   Smear Review SMEAR STAINED AND AVAILABLE FOR REVIEW     RETICULOCYTES     Status: Abnormal   Collection Time   09/18/12  9:07 AM      Component Value Range Comment   Retic Ct Pct 3.7 (*) 0.4 - 3.1 %    RBC. 3.39 (*) 3.87 - 5.11 MIL/uL    Retic Count, Manual 125.4  19.0 - 186.0 K/uL    Constitutional: positive for fatigue, negative for chills, fevers and sweats Ears, nose, mouth, throat, and face: negative for earaches, hearing loss, hoarseness, nasal congestion and sore throat Respiratory: positive for dyspnea on exertion, wheezing and shortness of breath Cardiovascular: positive for dyspnea and exertional chest pressure/discomfort, negative for chest pain, lower extremity edema and palpitations Gastrointestinal: positive for nausea and vomiting, negative for abdominal pain Genitourinary:negative, anuric Musculoskeletal:negative for back pain, myalgias and neck pain Neurological: negative for coordination problems, dizziness, headaches and speech problems  Physical Exam: Filed Vitals:   09/18/12 1025  BP: 160/89  Pulse: 99  Temp: 98.1 F (36.7 C)  Resp: 17     General appearance: alert, cooperative and no distress Head: Normocephalic, without obvious abnormality, atraumatic Neck: no adenopathy, no carotid bruit, no JVD and supple, symmetrical, trachea midline Resp: short quick breaths, unable to  breath deeply, expiratory wheezes Cardio: regular rate and rhythm, S1, S2 normal, no murmur, click, rub or gallop GI: soft, non-tender; bowel sounds normal; no masses,  no organomegaly Extremities: extremities normal, atraumatic, no cyanosis or edema Neurologic: Grossly normal Dialysis Access: AVF @ LUA with + bruit   Assessment/Plan: 1. Dyspnea/chest pain - chest pain resolved, but dyspnea worsening; chest x-ray indicates worsening CHF with bilateral effusions and bibasilar atelectasis.  Extra HD today with UF goal of 4 L. 2. ESRD -  HD on TTS @ [facility], K 4.  HD today. 3. Hypertension/volume  - BP most recently 160/89 on Amlodipine 10 mg qhs; wt 79 kg this AM with outpt EDW 80.5 kg, but post-HD wt was 79.5 on 9/21.  UF goal of 4 L today. 4. Anemia  - Hgb 10.3 s/p 2 U PRBCs yesterday for Hgb 7; outpt Epogen 19,800 U, previously followed by Dr. Monika Salk.  Aranesp 200 mcg tomorrow. 5. Metabolic bone disease -  Ca 9.3 (10 corrected), P 5.2, on Zemplar 5 mcg, Sensipar 30 mg qd, and Renvela. 6. Nutrition - Alb 3.1. 7. Hypothyroidism - on Synthroid.  LYLES,CHARLES 09/18/2012, 11:15 AM   Attending Nephrologist: Delano Metz, MD  Patient seen and examined and agree with assessment and plan as above. 71 yo w ESRD due to HTN admitted with SOB and pulmonary edema.  Suspect lean body weight loss and volume overload as cause. Plan acute HD today w/ maximal UF.  See orders also. Rec's as above. [name]  MD [employer] [phone] pgr    [phone] cell 09/18/2012, 4:54 PM

## 2012-09-18 NOTE — Consult Note (Signed)
#   [id number] is consult note.  Meredith E.

## 2012-09-18 NOTE — Progress Notes (Addendum)
09/18/2012 patient came from hemodialysis has a non rebreather on  5 Liter , came back to room saturation was check it was 99%. Rn also mention to Dr Craige Cotta patient Nasal Cannula was increase to 5 Liters today. Patient was placed on Nasal Cannula by charge nurse and saturation was 93 to 94%. Md came to unit and assess patient, orders were given to keep patient on 4 Liter Nasal cannula and check oxygen frequently. Patient saturation on 4 Liters is 92%. Biochemist, clinical.

## 2012-09-18 NOTE — ED Notes (Signed)
Dialysis on [name], Saturday

## 2012-09-18 NOTE — Progress Notes (Signed)
PATIENT DETAILS Name: [name] Age: 71 y.o. Sex: female Date of Birth: [birth year] Admit Date: 09/17/2012 Admitting Physician Eduard Clos, MD [id number] Not In System  Subjective: Still with shortness of breath-but feels better this am-compared to last few days. No vomiting today. No diarrhea today.  Assessment/Plan: Principal Problem:  *Anemia -no obvious blood loss-patient denies melena or hemetochezia, FOBT stool negative on admit. -S/P 2 units PRBC -Mild Bilirubin elevation, LDH within normal limits-has h/o Autoiimune Hemolytic anemia-not sure if this is the cultprit for this drop in Hb-will moniotr closely for any signs of blood loss-however did speak with Dr Kandis Mannan who will evaluate patient. Save smear for his review.  Active Problems: Shortness of breath -multifactorial-likely 2/2 anemia and pulmonary edema-CXR today showing worsening pul edema -troponin X 3 negative -Extra HD today-hopefully this will relieve her symptoms-if not may need further work up -check Echo -repeat CXR after HD   ESRD (end stage renal disease) on dialysis -per Renal   HTN (hypertension) -Moderately controlled with Amlodipine, Imdur   COPD (chronic obstructive pulmonary disease) -although with Shortness of breath-not wheezing -as needed albuterol nebs -c/w Advair   Hypothyroidism -c/w Levothyroxine   Nausea and vomiting -?viral  -seems to have resolved -monitor closely-belly is soft  Disposition: Remain inpatient  DVT Prophylaxis: SCD's till anemia etiology is clear  Code Status: Full code or DNR  Procedures:  None  CONSULTS:  nephrology and hematology/oncology  PHYSICAL EXAM: Vital signs in last 24 hours: Filed Vitals:   09/18/12 1025 09/18/12 1414 09/18/12 1434 09/18/12 1449  BP: 160/89  141/82 146/77  Pulse: 99  99 96  Temp: 98.1 F (36.7 C)  98.4 F (36.9 C)   TempSrc: Oral  Oral   Resp: 17  29 25   Height:      Weight:  81.5 kg (179 lb  10.8 oz)    SpO2: 92%  92% 88%    Weight change:  Body mass index is 30.84 kg/(m^2).   Gen Exam: Awake and alert with clear speech.   Neck: Supple, No JVD.   Chest: B/L Clear.   CVS: S1 S2 Regular, no murmurs.  Abdomen: soft, BS +, non tender, non distended.  Extremities: no edema, lower extremities warm to touch. Neurologic: Non Focal.   Skin: No Rash.   Wounds: N/A.    Intake/Output from previous day: No intake or output data in the 24 hours ending 09/18/12 1510   LAB RESULTS: CBC  Lab 09/18/12 0620 09/17/12 2151  WBC 6.8 4.2  HGB 10.3* 7.0*  HCT 32.3* 22.0*  PLT 169 152  MCV 94.7 97.8  MCH 30.2 31.1  MCHC 31.9 31.8  RDW 17.7* 15.5  LYMPHSABS 0.9 0.8  MONOABS 0.3 0.2  EOSABS 0.2 0.1  BASOSABS 0.0 0.0  BANDABS -- --    Chemistries   Lab 09/18/12 0620 09/17/12 2330  NA 143 141  K 4.0 3.0*  CL 99 100  CO2 35* 31  GLUCOSE 101* 85  BUN 15 11  CREATININE 6.09* 5.24*  CALCIUM 9.3 8.5  MG -- --    CBG: No results found for this basename: GLUCAP:5 in the last 168 hours  GFR Estimated Creatinine Clearance: 8.6 ml/min (by C-G formula based on Cr of 6.09).  Coagulation profile No results found for this basename: INR:5,PROTIME:5 in the last 168 hours  Cardiac Enzymes  Lab 09/18/12 1237 09/18/12 0620 09/18/12 0018  CKMB -- -- --  TROPONINI <0.30 <0.30 <0.30  MYOGLOBIN -- -- --  No components found with this basename: POCBNP:3 No results found for this basename: DDIMER:2 in the last 72 hours No results found for this basename: HGBA1C:2 in the last 72 hours No results found for this basename: CHOL:2,HDL:2,LDLCALC:2,TRIG:2,CHOLHDL:2,LDLDIRECT:2 in the last 72 hours No results found for this basename: TSH,T4TOTAL,FREET3,T3FREE,THYROIDAB in the last 72 hours  Basename 09/18/12 0907  VITAMINB12 --  FOLATE --  FERRITIN --  TIBC --  IRON --  RETICCTPCT 3.7*    Basename 09/17/12 2330  LIPASE 36  AMYLASE --    Urine Studies No results found for  this basename: UACOL:2,UAPR:2,USPG:2,UPH:2,UTP:2,UGL:2,UKET:2,UBIL:2,UHGB:2,UNIT:2,UROB:2,ULEU:2,UEPI:2,UWBC:2,URBC:2,UBAC:2,CAST:2,CRYS:2,UCOM:2,BILUA:2 in the last 72 hours  MICROBIOLOGY: Recent Results (from the past 240 hour(s))  MRSA PCR SCREENING     Status: Abnormal   Collection Time   09/18/12  2:09 AM      Component Value Range Status Comment   MRSA by PCR POSITIVE (*) NEGATIVE Final     RADIOLOGY STUDIES/RESULTS: Dg Chest Port 1 View  09/18/2012  *RADIOLOGY REPORT*  Clinical Data: Shortness of breath, cough, history hypertension, COPD, end-stage renal disease on dialysis  PORTABLE CHEST - 1 VIEW  Comparison: Portable exam 0930 hours compared to 09/17/2012  Findings: Enlargement of cardiac silhouette with pulmonary vascular congestion. Atherosclerotic calcification aorta. Diffuse interstitial edema with bibasilar effusions consistent with CHF. Bibasilar atelectasis greater on the right. No pneumothorax. Bones demineralized.  IMPRESSION: CHF with bilateral pleural effusions and bibasilar atelectasis. When compared to previous exam, right pleural effusion has increased in size.   Original Report Authenticated By: Lollie Marrow, M.D.    Dg Chest Port 1 View  09/17/2012  *RADIOLOGY REPORT*  Clinical Data: 71 year old female chest pain and shortness of breath.  Chronic renal disease and hypertension.  PORTABLE CHEST - 1 VIEW  Comparison: 05/12/2011 and earlier.  Findings: Portable semi upright AP view 2131 hours.  Right IJ dialysis catheter no longer present.  Small bilateral pleural effusions are increased from prior.  Slightly lower lung volumes. Stable cardiomegaly and mediastinal contours.  Increased interstitial markings probably related to vascular congestion/edema.  No pneumothorax.  No definite consolidation.  IMPRESSION: Interstitial edema and increased chronic small effusions.   Original Report Authenticated By: Harley Hallmark, M.D.     MEDICATIONS: Scheduled Meds:   . amLODipine   10 mg Oral QHS  . antiseptic oral rinse  15 mL Mouth Rinse BID  . calcium acetate  667 mg Oral TID WC  . Chlorhexidine Gluconate Cloth  6 each Topical Q0600  . cinacalcet  30 mg Oral QHS  . Fluticasone-Salmeterol  1 puff Inhalation BID  . isosorbide mononitrate  60 mg Oral QHS  . levothyroxine  125 mcg Oral QHS  . mupirocin ointment  1 application Nasal BID  . paricalcitol  5 mcg Intravenous 3 times weekly  . sevelamer  800 mg Oral TID WC  . sodium chloride  3 mL Intravenous Q12H  . sodium chloride  3 mL Intravenous Q12H  . DISCONTD: sodium chloride   Intravenous STAT   Continuous Infusions:   . DISCONTD: sodium chloride 125 mL/hr at 09/17/12 2150   PRN Meds:.acetaminophen, acetaminophen, albuterol-ipratropium, diphenhydrAMINE, guaiFENesin-dextromethorphan, ondansetron (ZOFRAN) IV, ondansetron  Antibiotics: Anti-infectives    None       Jeoffrey Massed, MD  [facility] Pager:[phone]  If 7PM-7AM, please contact night-coverage [email or web address] Password [email or web address] 09/18/2012, 3:10 PM   LOS: 1 day

## 2012-09-18 NOTE — Progress Notes (Addendum)
Shift event: Day shift RN, [name], paged this NP to inform me that pt had desatted "earlier today" and had done so as well in dialysis this afternoon, and was progressed to NRB in dialysis by that RN. When pt to unit, she was on 6L O2 per [place] and satting normally. NP to bedside. S: [name] says that when she was in dialysis, she became SOB and had "cramping" in her chest. After the O2 was applied and progressed to NRB, her chest pain had subsided (after just a minute or so). Her SOB improved as well and she was able to be stepped down to NCO2. At present, she is not having any SOB or chest pain. She is enjoying her dinner and has no air hunger. She feels a lot better since "getting that fluid off" in HD. O: VS reviewed. She appears older than stated age, but well, and she is not toxic. She is alert and oriented. Talking without SOB. Eating dinner without SOB or increase in resp effort. Lungs have good air exchange and are clear. A/P: 1. SOB with earlier desaturation-she appears well now. I reduced her O2 to 4L and her O2 sat remained 92%. Will keep her at 4L and watch sat carefully tonight. RN to call for any changes or worsening resp effort. I think she is stable enough to leave on 6700 for now, but if any further distress, will consider higher level of care. Daughter at bedside, updated.  Meredith Reamer, NP [facility] Labs reviewed and pt has had 3 neg troponins here, with last neg one today.  KK

## 2012-09-18 NOTE — Progress Notes (Signed)
Patient arrived on unit from ED. Patient alert and oriented, from home with husband. Daughter at bedside. Patient oriented to unit and telemetry placed per MD order. BP 176/98 HR 81 Temp 98.6 O2 92% on 2 liters n/c.  Steele Berg RN

## 2012-09-18 NOTE — Procedures (Signed)
I was present at this dialysis session. I have reviewed the session itself and made appropriate changes.   [name], MD [employer] [employer] 09/18/2012, 4:53 PM

## 2012-09-18 NOTE — Progress Notes (Signed)
Upon returning pt to the room, Nadine, RN questioned the non-rebreather on the pt. See hemodialysis tx documentation. Rationale explained at bedside.

## 2012-09-19 ENCOUNTER — Inpatient Hospital Stay (HOSPITAL_COMMUNITY): Payer: Medicare Other

## 2012-09-19 ENCOUNTER — Other Ambulatory Visit: Payer: Self-pay

## 2012-09-19 LAB — IRON AND TIBC
Iron: 16 ug/dL — ABNORMAL LOW (ref 42–135)
Saturation Ratios: 11 % — ABNORMAL LOW (ref 20–55)
UIBC: 128 ug/dL (ref 125–400)

## 2012-09-19 LAB — CBC
HCT: 30.7 % — ABNORMAL LOW (ref 36.0–46.0)
Hemoglobin: 9.8 g/dL — ABNORMAL LOW (ref 12.0–15.0)
MCV: 98.1 fL (ref 78.0–100.0)
RBC: 3.13 MIL/uL — ABNORMAL LOW (ref 3.87–5.11)
WBC: 6.1 10*3/uL (ref 4.0–10.5)

## 2012-09-19 LAB — CK TOTAL AND CKMB (NOT AT ARMC)
CK, MB: 1.5 ng/mL (ref 0.3–4.0)
Relative Index: INVALID (ref 0.0–2.5)

## 2012-09-19 LAB — TYPE AND SCREEN
ABO/RH(D): AB POS
Antibody Screen: NEGATIVE
Unit division: 0

## 2012-09-19 LAB — RENAL FUNCTION PANEL
BUN: 13 mg/dL (ref 6–23)
CO2: 31 mEq/L (ref 19–32)
Chloride: 97 mEq/L (ref 96–112)
Creatinine, Ser: 4.2 mg/dL — ABNORMAL HIGH (ref 0.50–1.10)
Glucose, Bld: 97 mg/dL (ref 70–99)
Potassium: 4 mEq/L (ref 3.5–5.1)

## 2012-09-19 LAB — FERRITIN: Ferritin: 1032 ng/mL — ABNORMAL HIGH (ref 10–291)

## 2012-09-19 MED ORDER — DARBEPOETIN ALFA-POLYSORBATE 200 MCG/0.4ML IJ SOLN
INTRAMUSCULAR | Status: AC
  Administered 2012-09-19: 200 ug via INTRAVENOUS
  Filled 2012-09-19: qty 0.4

## 2012-09-19 MED ORDER — DILTIAZEM HCL 50 MG/10ML IV SOLN
10.0000 mg | Freq: Once | INTRAVENOUS | Status: AC
  Administered 2012-09-19: 10 mg via INTRAVENOUS
  Filled 2012-09-19: qty 2

## 2012-09-19 MED ORDER — DARBEPOETIN ALFA-POLYSORBATE 200 MCG/0.4ML IJ SOLN
200.0000 ug | INTRAMUSCULAR | Status: DC
  Administered 2012-09-19: 200 ug via INTRAVENOUS
  Filled 2012-09-19: qty 0.4

## 2012-09-19 NOTE — Progress Notes (Signed)
Subjective:   No current complaints, breathing much better; ate dinner last night, but later went to bathroom for BM, but instead vomited.  Objective: Vital signs in last 24 hours: Temp:  [96.7 F (35.9 C)-99 F (37.2 C)] 98.4 F (36.9 C) (09/24 0446) Pulse Rate:  [81-99] 89  (09/24 0446) Resp:  [16-32] 20  (09/24 0446) BP: (109-160)/(60-89) 152/82 mmHg (09/24 0446) SpO2:  [88 %-99 %] 92 % (09/24 0624) FiO2 (%):  [44 %] 44 % (09/23 2017) Weight:  [80.241 kg (176 lb 14.4 oz)-81.5 kg (179 lb 10.8 oz)] 80.241 kg (176 lb 14.4 oz) (09/23 2213) Weight change: 2.5 kg (5 lb 8.2 oz)  Intake/Output from previous day: 09/23 0701 - 09/24 0700 In: 240 [P.O.:240] Out: 4000    EXAM: General appearance:  Alert, in no apparent distress Resp:  CTA without rales, rhonchi, or wheezes Cardio:  RRR without murmur GI:  + BS, soft and nontender Extremities:  No edema Access:  AVF @ LUA with + bruit  Lab Results:  [facility] 09/18/12 0620 09/17/12 2151  WBC 6.8 4.2  HGB 10.3* 7.0*  HCT 32.3* 22.0*  PLT 169 152   BMET:  Basename 09/18/12 0620 09/17/12 2330  NA 143 141  K 4.0 3.0*  CL 99 100  CO2 35* 31  GLUCOSE 101* 85  BUN 15 11  CREATININE 6.09* 5.24*  CALCIUM 9.3 8.5  ALBUMIN 3.1* 2.7*   No results found for this basename: PTH:2 in the last 72 hours Iron Studies: No results found for this basename: IRON,TIBC,TRANSFERRIN,FERRITIN in the last 72 hours  Dialysis Orders: Center: [facility] on TTS.  EDW 80.5 kg HD Bath 2K/2.25Ca Time 3 hrs 45 mins Heparin 4000 U. Access AVF @ LUA BFR 450 DFR 800  Zemplar 5 mcg IV/HD Epogen 19,800 Units IV/HD Venofer 0.   Assessment/Plan: 1. Dyspnea/chest pain - chest pain resolved. Had HD w 4kg off yesterday. Still dyspneic, +orthopnea and still significant CXR changes on today's film c/w vol overload and pulm edema. HD today per usual schedule w max UF. 2. ESRD - HD on TTS @ [facility], K 4. HD on regular schedule today. 3. Hypertension/volume - BP most recently  152/82, on Amlodipine 10 mg qhs; wt 80.2 kg s/p net UF of 4 L, outpt EDW 80.5 kg. 4. Anemia - Hgb 9.8 this AM s/p 2 U PRBCs yesterday for Hgb 7; outpt Epogen 19,800 U, previously followed by Dr. Monika Salk. Aranesp 200 mcg tomorrow. 5. Metabolic bone disease - Ca 9.5 (10.1 corrected), P 5.2, on Zemplar 5 mcg, Sensipar 30 mg qd, and Renvela. 6. Nutrition - Alb 3.2. 7. Hypothyroidism - on Synthroid.    LOS: 2 days   [name],[name] 09/19/2012,7:35 AM  Patient seen and examined and agree with assessment and plan as above with additions as indicated.  Meredith Moselle  MD [employer] [employer] [phone] pgr    [phone] cell 09/19/2012, 2:02 PM

## 2012-09-19 NOTE — Progress Notes (Signed)
  Echocardiogram 2D Echocardiogram has been performed.  [place] 09/19/2012, 12:07 PM

## 2012-09-19 NOTE — Procedures (Signed)
I was present at this dialysis session. I have reviewed the session itself and made appropriate changes.   [name], MD [employer] [employer] 09/19/2012, 3:07 PM

## 2012-09-19 NOTE — Progress Notes (Signed)
[facility] PROGRESS NOTE  [name] AVW:[id number] DOB: 12-08-1941 DOA: 09/17/2012 PCP: Provider Not In System  Assessment/Plan: Principal Problem:  *Anemia Active Problems:  ESRD (end stage renal disease) on dialysis  HTN (hypertension)  COPD (chronic obstructive pulmonary disease)  Hypothyroidism  Nausea and vomiting    Anemia  -no obvious blood loss-patient denies melena or hemetochezia, FOBT stool negative on admit.  -S/P 2 units PRBC  Hematology consult, erythropoietin level is pending, low serum iron, venofere? -No indication for a bone marrow biopsy, low suspicion for myelodysplasia, myeloproliferative disease, plasma cell dyscrasia     Active Problems:  Shortness of breath  Dyspnea/chest pain - chest pain resolved, chest x-ray still with mild pulmonary edema and bibasilar opacities; no current dyspnea s/p HD yesterday with net UF of 4 L. Troponins negative -Hemodialysis again today 2-D echo results pending -repeat CXR after HD    ESRD (end stage renal disease) on dialysis  -per Renal  HTN (hypertension)  -Moderately controlled with Amlodipine, Imdur    COPD (chronic obstructive pulmonary disease)  -although with Shortness of breath-not wheezing  -as needed albuterol nebs  -c/w Advair   Hypothyroidism  -c/w Levothyroxine   Nausea and vomiting  -?viral  -seems to have resolved  -monitor closely-belly is soft   Atrial fibrillation Called by RN stating that the patient has atrial fibrillation with a rate in the 90s Ordered an EKG, troponin, Cardizem IV push   Disposition:  Remain inpatient  DVT Prophylaxis:  SCD's till anemia etiology is clear  Code Status:  Full code or DNR  Procedures:  None CONSULTS:  nephrology and hematology/oncology      HPI/Subjective: No current complaints, breathing much better; ate dinner last night, but later went to bathroom for BM, but instead vomited   Objective: Filed Vitals:   09/19/12 0446  09/19/12 0624 09/19/12 0825 09/19/12 1000  BP: 152/82   135/72  Pulse: 89   85  Temp: 98.4 F (36.9 C)   98 F (36.7 C)  TempSrc: Oral   Oral  Resp: 20   20  Height:      Weight:      SpO2: 92% 92% 94% 96%    Intake/Output Summary (Last 24 hours) at 09/19/12 1327 Last data filed at 09/18/12 2213  Gross per 24 hour  Intake    240 ml  Output   4000 ml  Net  -3760 ml    Exam:  General appearance: Alert, in no apparent distress  Resp: CTA without rales, rhonchi, or wheezes  Cardio: RRR without murmur  GI: + BS, soft and nontender  Extremities: No edema  Access: AVF @ LUA with + bruit    Data Reviewed: Basic Metabolic Panel:  Lab 09/19/12 8295 09/18/12 0620 09/17/12 2330  NA 140 143 141  K 4.0 4.0 3.0*  CL 97 99 100  CO2 31 35* 31  GLUCOSE 97 101* 85  BUN 13 15 11   CREATININE 4.20* 6.09* 5.24*  CALCIUM 9.5 9.3 8.5  MG -- -- --  PHOS 4.4 5.2* --    Liver Function Tests:  Lab 09/19/12 0605 09/18/12 0620 09/17/12 2330  AST -- 11 9  ALT -- 6 6  ALKPHOS -- 65 51  BILITOT -- 1.7* 0.5  PROT -- 6.6 5.7*  ALBUMIN 3.2* 3.1* 2.7*    Lab 09/17/12 2330  LIPASE 36  AMYLASE --   No results found for this basename: AMMONIA:5 in the last 168 hours  CBC:  Lab 09/19/12  9604 09/18/12 0620 09/17/12 2151  WBC 6.1 6.8 4.2  NEUTROABS -- 5.4 3.2  HGB 9.8* 10.3* 7.0*  HCT 30.7* 32.3* 22.0*  MCV 98.1 94.7 97.8  PLT 181 169 152    Cardiac Enzymes:  Lab 09/18/12 1237 09/18/12 0620 09/18/12 0018  CKTOTAL -- -- --  CKMB -- -- --  CKMBINDEX -- -- --  TROPONINI <0.30 <0.30 <0.30   BNP (last 3 results) No results found for this basename: PROBNP:3 in the last 8760 hours   CBG: No results found for this basename: GLUCAP:5 in the last 168 hours  Recent Results (from the past 240 hour(s))  MRSA PCR SCREENING     Status: Abnormal   Collection Time   09/18/12  2:09 AM      Component Value Range Status Comment   MRSA by PCR POSITIVE (*) NEGATIVE Final       Studies: Dg Chest Port 1 View  09/19/2012  *RADIOLOGY REPORT*  Clinical Data: Shortness of breath, weakness  PORTABLE CHEST - 1 VIEW  Comparison: 09/18/2012; 09/17/2012; 05/12/2011  Findings: Grossly unchanged enlarged cardiac silhouette and mediastinal contours with extensive atherosclerotic calcifications within the aortic arch and descending thoracic aorta.  Lung volumes remain reduced.  There is chronic bilateral pleural parenchymal thickening and blunting of the bilateral costophrenic angles. Pulmonary vasculature remains indistinct.  Grossly unchanged basilar opacities.  No definite pneumothorax.  Grossly unchanged bones.  Vascular calcifications overlie the bilateral axilla.  IMPRESSION: Grossly unchanged of hypoventilation, mild pulmonary edema and bibasilar opacities, atelectasis versus infiltrate.  Further evaluation with a PA and lateral chest radiograph may be obtained as clinically indicated.   Original Report Authenticated By: Waynard Reeds, M.D.    Dg Chest Port 1 View  09/18/2012  *RADIOLOGY REPORT*  Clinical Data: Shortness of breath, cough, history hypertension, COPD, end-stage renal disease on dialysis  PORTABLE CHEST - 1 VIEW  Comparison: Portable exam 0930 hours compared to 09/17/2012  Findings: Enlargement of cardiac silhouette with pulmonary vascular congestion. Atherosclerotic calcification aorta. Diffuse interstitial edema with bibasilar effusions consistent with CHF. Bibasilar atelectasis greater on the right. No pneumothorax. Bones demineralized.  IMPRESSION: CHF with bilateral pleural effusions and bibasilar atelectasis. When compared to previous exam, right pleural effusion has increased in size.   Original Report Authenticated By: Lollie Marrow, M.D.    Dg Chest Port 1 View  09/17/2012  *RADIOLOGY REPORT*  Clinical Data: 71 year old female chest pain and shortness of breath.  Chronic renal disease and hypertension.  PORTABLE CHEST - 1 VIEW  Comparison: 05/12/2011 and  earlier.  Findings: Portable semi upright AP view 2131 hours.  Right IJ dialysis catheter no longer present.  Small bilateral pleural effusions are increased from prior.  Slightly lower lung volumes. Stable cardiomegaly and mediastinal contours.  Increased interstitial markings probably related to vascular congestion/edema.  No pneumothorax.  No definite consolidation.  IMPRESSION: Interstitial edema and increased chronic small effusions.   Original Report Authenticated By: Ulla Potash III, M.D.     Scheduled Meds:   . amLODipine  10 mg Oral QHS  . antiseptic oral rinse  15 mL Mouth Rinse BID  . calcium acetate  667 mg Oral TID WC  . Chlorhexidine Gluconate Cloth  6 each Topical Q0600  . cinacalcet  30 mg Oral QHS  . darbepoetin (ARANESP) injection - DIALYSIS  200 mcg Intravenous Q Tue-HD  . Fluticasone-Salmeterol  1 puff Inhalation BID  . isosorbide mononitrate  60 mg Oral QHS  . levothyroxine  125  mcg Oral QHS  . mupirocin ointment  1 application Nasal BID  . paricalcitol  5 mcg Intravenous 3 times weekly  . sevelamer  800 mg Oral TID WC  . sodium chloride  3 mL Intravenous Q12H  . sodium chloride  3 mL Intravenous Q12H   Continuous Infusions:   Principal Problem:  *Anemia Active Problems:  ESRD (end stage renal disease) on dialysis  HTN (hypertension)  COPD (chronic obstructive pulmonary disease)  Hypothyroidism  Nausea and vomiting    Time spent: 40 minutes   [facility]  [facility] Pager [phone]. If 8PM-8AM, please contact night-coverage at [email or web address], password [facility] 09/19/2012, 1:27 PM  LOS: 2 days

## 2012-09-19 NOTE — Consult Note (Signed)
[name], Reynolds NO.:  [id number]  MEDICAL RECORD NO.:  [id number]  LOCATION:  1610                         FACILITY:  [facility]  PHYSICIAN:  Josph Macho, M.D.  DATE OF BIRTH:  Mar 06, 1941  DATE OF CONSULTATION: DATE OF DISCHARGE:                                CONSULTATION   REFERRING PHYSICIAN:  Jeoffrey Massed, MD  REASON FOR CONSULTATION: 1. Anemia. 2. History of Evans syndrome. 3. Chronic renal failure, on hemodialysis.  HISTORY OF PRESENT ILLNESS:  [name] is a very nice 71 year old African-American female.  I have known her proper about 14 or 15 years. 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  She had Evans syndrome.  She was treated with steroids.  She went to remission and then has been in remission since.  I have not seen her about I think for 3 or 4 years.  She has began hemodialysis.  She gets it 3 times a week.  She has been getting Procrit with this.  Her hemoglobin has been doing quite well.  She was admitted on September 17, 2012, with profound anemia.  Her hemoglobin was 7 and hematocrit was 22.  One month prior, her hemoglobin was 14 and hematocrit was 41.  The patient denies any bleeding.  There is no melena.  Her white cell count is fine and platelet count was fine.  She did get 2 units of blood.  She did undergo I think the lower endoscopy.  She reports this as being unremarkable.  She did have a haptoglobin level done, this was 146.  The patient's reticulocyte count done today.  This, however, count was after she got 2 units of blood.  The reticulocyte count when corrected was about 3.  Her last iron studies were done that I have over a year ago.  Her LDH was 145.  Her BUN and creatinine were 15 and 6.  Her calcium was 9.3 with an albumin of 3.1.  I was asked to see her to try to help to evaluate the anemia.  Her pattern of issues had noted change in  medications.  She has not traveled anywhere.  She really has had a fairly quiet life since we last saw her.  PAST MEDICAL HISTORY:  Remarkable for: 1. Evans syndrome-remission x10 years. 2. Hypertension. 3. Hyperlipidemia. 4. Chronic renal failure, on dialysis. 5. Chronic obstructive pulmonary disease.  ALLERGIES:  SHELLFISH.  HOME MEDICATIONS: 1. Combivent inhaler 2 puffs b.i.d. p.r.n. 2. Norvasc 10 mg p.o. daily. 3. B complex with folic acid daily. 4. Sensipar 30 mg at bedtime. 5. Advair Diskus one half to two puffs q.12 hours p.r.n. 6. Imdur 60 mg p.o. daily. 7. Synthroid 0.125 mg p.o. daily. 8. Sevelamer 240 mg t.i.d.  SOCIAL HISTORY:  Negative for current tobacco use.  She has smoked pipe about 10-15 years ago.  FAMILY HISTORY:  Noncontributory.  PHYSICAL EXAMINATION:  GENERAL:  This is a fairly well-developed, well- nourished, black female, in no obvious distress. VITAL SIGNS:  Her temperature is 98.4, pulse 90, respiratory rate 23, blood pressure 136/80. HEAD AND NECK:  Shows a normocephalic and  atraumatic skull.  There is no scleral icterus.  There is no oral lesions.  She has no adenopathy in her neck. LUNGS:  Clear bilaterally. CARDIAC:  Regular rate and rhythm with a normal S1, S2.  She has 1/6 systolic ejection murmur. ABDOMEN:  Soft with good bowel sounds.  She has no fluid wave.  There is no guarding or rebound tenderness.  There is no palpable hepatosplenomegaly. EXTREMITIES:  Shows no clubbing, cyanosis, or edema.  She has good range of motion of her joints. SKIN:  Shows some slightly dry skin. NEUROLOGIC:  No focal neurological deficits.  LABORATORY STUDIES:  Her chest x-ray that was done today showed a congestive heart failure with pleural effusions and atelectasis.  I looked to her blood smear.  She has a normochromic, normocytic population of red blood cells.  She has some polychromasia.  There is no nucleated red cells.  She has no teardrop  cells.  I see no sign of schistocyte.  There is no spherocytes.  Again, there are no target cells.  White cells are with normal morphology.  She has no hypersegmented polys.  There is no immature myeloid or lymphoid forms. I did not see any hypersegmented polys.  There is no blasts.  Platelets were adequate in number and size.  IMPRESSION:  [name] is a very nice 71 year old African female who I have known for a long long time.  She still looks quite good.  I really have a hard time to explaining this anemia.  I am sending off an erythropoietin level on her.  I have seen where some patients develop antibodies to erythropoietin and as such, another agent needs to be use that might be able to overcome this.  I am sending an iron studies.  She may be iron deficient.  If so, then ESA would not work.  I do not see anything that would suggest myelodysplasia.  I do not see anything that would suggest a myeloproliferative problem.  I do not suspect any type of plasma cell disorder.  Her protein is only 6.6 with an albumin of 3.1.  She does not have hypercalcemia.  I suppose another possibility would be that this initial CBC was not correct and that her hemoglobin was not 7.  She went up to 10.3, which I think would be a little unusual with a transfusion.  I do not see anything that would suggest B12 deficiency.  She was last checked back in 2012.  I suppose this could be checked just to be on the safe side.  Again, I do not see that a bone marrow biopsy needs to be done at this point in time.  I will very well and just to see how rest of her lab work goes.  She will be in the hospital for few days.  She does have the Renal Service seen her.  I am sure Cardiology also will be involved.  We will certainly help out and try to improve her hemoglobin so that she does not go into heart failure.  It was wonderful to see [name] again.  Again, I have not seen her for 4 or 5  years.     Josph Macho, M.D.     PRE/MEDQ  D:  09/18/2012  T:  09/19/2012  Job:  [id number]  cc:   Jeoffrey Massed, MD Maree Krabbe, M.D.

## 2012-09-19 NOTE — Progress Notes (Signed)
Pt EKG shows A-Fib;informed Nadine and called Dr. Susie Cassette;  MD  ordered,  Cardizem 10mg  IV push, EKG stat and CK, MB. EKG done; blood sent to lab and waiting on Cardizem from pharmacy, reported to [place], RN  to be on the look out for Cardizem. Nadine aware of the Afib episode and the orders from Dr. Susie Cassette. Pt will get the cardiezem on the floor.

## 2012-09-20 ENCOUNTER — Ambulatory Visit (HOSPITAL_COMMUNITY): Payer: Medicare Other

## 2012-09-20 LAB — POCT I-STAT, CHEM 8
Creatinine, Ser: 4.5 mg/dL — ABNORMAL HIGH (ref 0.50–1.10)
HCT: 19 % — ABNORMAL LOW (ref 36.0–46.0)
Hemoglobin: 6.5 g/dL — CL (ref 12.0–15.0)
Potassium: 3.1 mEq/L — ABNORMAL LOW (ref 3.5–5.1)
Sodium: 144 mEq/L (ref 135–145)
TCO2: 29 mmol/L (ref 0–100)

## 2012-09-20 LAB — POCT I-STAT TROPONIN I: Troponin i, poc: 0.03 ng/mL (ref 0.00–0.08)

## 2012-09-20 LAB — ERYTHROPOIETIN: Erythropoietin: 16.8 m[IU]/mL (ref 2.6–34.0)

## 2012-09-20 MED ORDER — ACETAMINOPHEN 325 MG PO TABS: 650.0000 mg | ORAL_TABLET | Freq: Four times a day (QID) | ORAL | Status: DC | PRN

## 2012-09-20 MED ORDER — HYDROXYZINE HCL 25 MG PO TABS: 25.0000 mg | ORAL_TABLET | Freq: Three times a day (TID) | ORAL | Status: DC | PRN

## 2012-09-20 MED ORDER — ACETAMINOPHEN 650 MG RE SUPP: 650.0000 mg | Freq: Four times a day (QID) | RECTAL | Status: DC | PRN

## 2012-09-20 MED ORDER — CALCIUM CARBONATE 1250 MG/5ML PO SUSP: 500.0000 mg | Freq: Four times a day (QID) | ORAL | Status: DC | PRN

## 2012-09-20 MED ORDER — FERUMOXYTOL INJECTION 510 MG/17 ML
510.0000 mg | Freq: Once | INTRAVENOUS | Status: AC
  Administered 2012-09-20: 510 mg via INTRAVENOUS
  Filled 2012-09-20: qty 17

## 2012-09-20 MED ORDER — ONDANSETRON HCL 4 MG PO TABS: 4.0000 mg | ORAL_TABLET | Freq: Four times a day (QID) | ORAL | Status: DC | PRN

## 2012-09-20 MED ORDER — METOPROLOL SUCCINATE ER 25 MG PO TB24: 25.0000 mg | ORAL_TABLET | Freq: Every day | ORAL | Status: DC

## 2012-09-20 MED ORDER — NEPRO/CARBSTEADY PO LIQD: 237.0000 mL | Freq: Three times a day (TID) | ORAL | Status: DC | PRN

## 2012-09-20 MED ORDER — SORBITOL 70 % SOLN
30.0000 mL | Status: DC | PRN
  Administered 2012-09-20: 30 mL via ORAL
  Filled 2012-09-20: qty 30

## 2012-09-20 MED ORDER — ONDANSETRON HCL 4 MG/2ML IJ SOLN
4.0000 mg | Freq: Four times a day (QID) | INTRAMUSCULAR | Status: DC | PRN
  Filled 2012-09-20: qty 2

## 2012-09-20 MED ORDER — CAMPHOR-MENTHOL 0.5-0.5 % EX LOTN
1.0000 "application " | TOPICAL_LOTION | Freq: Three times a day (TID) | CUTANEOUS | Status: DC | PRN
  Filled 2012-09-20: qty 222

## 2012-09-20 MED ORDER — DOCUSATE SODIUM 283 MG RE ENEM: 1.0000 | ENEMA | RECTAL | Status: DC | PRN

## 2012-09-20 MED ORDER — ZOLPIDEM TARTRATE 5 MG PO TABS: 5.0000 mg | ORAL_TABLET | Freq: Every evening | ORAL | Status: DC | PRN

## 2012-09-20 NOTE — Discharge Summary (Signed)
Physician Discharge Summary  BRYNNA DOBOS MRN: [id number] DOB/AGE: 1941/07/16 71 y.o.  PCP: Provider Not In System   Admit date: 09/17/2012 Discharge date: 09/20/2012  Discharge Diagnoses:  SVT with PACs without atrial fibrillation  *Anemia Shortness of breath secondary to pulmonary edema  ESRD (end stage renal disease) on dialysis  HTN (hypertension)  COPD (chronic obstructive pulmonary disease)  Hypothyroidism  Nausea and vomiting Anemia of chronic disease Metabolic bone disease Hypothyroidism MRSA positive    Medication List     As of 09/20/2012  1:01 PM    TAKE these medications         ADVAIR DISKUS IN   Inhale 1-2 puffs into the lungs daily as needed. For wheezing      amLODipine 10 MG tablet   Commonly known as: NORVASC   Take 10 mg by mouth at bedtime.      calcium acetate 667 MG capsule   Commonly known as: PHOSLO   Take 667 mg by mouth 3 (three) times daily with meals.      cinacalcet 30 MG tablet   Commonly known as: SENSIPAR   Take 30 mg by mouth at bedtime.      COMBIVENT 18-103 MCG/ACT inhaler   Generic drug: albuterol-ipratropium   Inhale 2 puffs into the lungs 2 (two) times daily as needed. For wheezing      diphenhydrAMINE 25 mg capsule   Commonly known as: BENADRYL   Take 25 mg by mouth every 6 (six) hours as needed. For allergies/sleep      hydrOXYzine 25 MG tablet   Commonly known as: ATARAX/VISTARIL   Take 1 tablet (25 mg total) by mouth every 8 (eight) hours as needed for itching.      isosorbide mononitrate 60 MG 24 hr tablet   Commonly known as: IMDUR   Take 60 mg by mouth at bedtime.      levothyroxine 125 MCG tablet   Commonly known as: SYNTHROID, LEVOTHROID   Take 125 mcg by mouth at bedtime.      metoprolol succinate 25 MG 24 hr tablet   Commonly known as: TOPROL-XL   Take 1 tablet (25 mg total) by mouth daily.      NEPHRO-VITE PO   Take 1 tablet by mouth at bedtime.      sevelamer 800 MG tablet   Commonly  known as: RENVELA   Take 240 mg by mouth 3 (three) times daily with meals.        Discharge Condition: Stable   Disposition: 01-Home or Self Care   Consults:   #1 nephrology #2 hematology oncology   Significant Diagnostic Studies: Dg Chest Port 1 View  09/19/2012  *RADIOLOGY REPORT*  Clinical Data: Shortness of breath, weakness  PORTABLE CHEST - 1 VIEW  Comparison: 09/18/2012; 09/17/2012; 05/12/2011  Findings: Grossly unchanged enlarged cardiac silhouette and mediastinal contours with extensive atherosclerotic calcifications within the aortic arch and descending thoracic aorta.  Lung volumes remain reduced.  There is chronic bilateral pleural parenchymal thickening and blunting of the bilateral costophrenic angles. Pulmonary vasculature remains indistinct.  Grossly unchanged basilar opacities.  No definite pneumothorax.  Grossly unchanged bones.  Vascular calcifications overlie the bilateral axilla.  IMPRESSION: Grossly unchanged of hypoventilation, mild pulmonary edema and bibasilar opacities, atelectasis versus infiltrate.  Further evaluation with a PA and lateral chest radiograph may be obtained as clinically indicated.   Original Report Authenticated By: Waynard Reeds, M.D.    Dg Chest Port 1 View  09/18/2012  *  RADIOLOGY REPORT*  Clinical Data: Shortness of breath, cough, history hypertension, COPD, end-stage renal disease on dialysis  PORTABLE CHEST - 1 VIEW  Comparison: Portable exam 0930 hours compared to 09/17/2012  Findings: Enlargement of cardiac silhouette with pulmonary vascular congestion. Atherosclerotic calcification aorta. Diffuse interstitial edema with bibasilar effusions consistent with CHF. Bibasilar atelectasis greater on the right. No pneumothorax. Bones demineralized.  IMPRESSION: CHF with bilateral pleural effusions and bibasilar atelectasis. When compared to previous exam, right pleural effusion has increased in size.   Original Report Authenticated By: Lollie Marrow,  M.D.    Dg Chest Port 1 View  09/17/2012  *RADIOLOGY REPORT*  Clinical Data: 71 year old female chest pain and shortness of breath.  Chronic renal disease and hypertension.  PORTABLE CHEST - 1 VIEW  Comparison: 05/12/2011 and earlier.  Findings: Portable semi upright AP view 2131 hours.  Right IJ dialysis catheter no longer present.  Small bilateral pleural effusions are increased from prior.  Slightly lower lung volumes. Stable cardiomegaly and mediastinal contours.  Increased interstitial markings probably related to vascular congestion/edema.  No pneumothorax.  No definite consolidation.  IMPRESSION: Interstitial edema and increased chronic small effusions.   Original Report Authenticated By: Ulla Potash III, M.D.      2-D echo LV EF: 60% - 65%  ------------------------------------------------------------ Indications: Dyspnea 786.09.  ------------------------------------------------------------ History: PMH: Chronic obstructive pulmonary disease. Risk factors: End stage renal disease. Hypertension.  ------------------------------------------------------------ Study Conclusions  - Left ventricle: The cavity size was normal. Wall thickness was normal. Systolic function was normal. The estimated ejection fraction was in the range of 60% to 65%. Wall motion was normal; there were no regional wall motion abnormalities. Doppler parameters are consistent with diastolic dysfunction. E/e' ratio is >10, suggesting elevated LV filling pressure.    Microbiology: Recent Results (from the past 240 hour(s))  MRSA PCR SCREENING     Status: Abnormal   Collection Time   09/18/12  2:09 AM      Component Value Range Status Comment   MRSA by PCR POSITIVE (*) NEGATIVE Final      Labs: Results for orders placed during the hospital encounter of 09/17/12 (from the past 48 hour(s))  CBC     Status: Abnormal   Collection Time   09/19/12  6:05 AM      Component Value Range Comment   WBC 6.1  4.0 -  10.5 K/uL    RBC 3.13 (*) 3.87 - 5.11 MIL/uL    Hemoglobin 9.8 (*) 12.0 - 15.0 g/dL    HCT 16.1 (*) 09.6 - 46.0 %    MCV 98.1  78.0 - 100.0 fL    MCH 31.3  26.0 - 34.0 pg    MCHC 31.9  30.0 - 36.0 g/dL    RDW 04.5 (*) 40.9 - 15.5 %    Platelets 181  150 - 400 K/uL   RENAL FUNCTION PANEL     Status: Abnormal   Collection Time   09/19/12  6:05 AM      Component Value Range Comment   Sodium 140  135 - 145 mEq/L    Potassium 4.0  3.5 - 5.1 mEq/L    Chloride 97  96 - 112 mEq/L    CO2 31  19 - 32 mEq/L    Glucose, Bld 97  70 - 99 mg/dL    BUN 13  6 - 23 mg/dL    Creatinine, Ser 8.11 (*) 0.50 - 1.10 mg/dL    Calcium 9.5  8.4 - 91.4 mg/dL  Phosphorus 4.4  2.3 - 4.6 mg/dL    Albumin 3.2 (*) 3.5 - 5.2 g/dL    GFR calc non Af Amer 10 (*) >90 mL/min    GFR calc Af Amer 11 (*) >90 mL/min   FERRITIN     Status: Abnormal   Collection Time   09/19/12  6:05 AM      Component Value Range Comment   Ferritin 1032 (*) 10 - 291 ng/mL   IRON AND TIBC     Status: Abnormal   Collection Time   09/19/12  6:05 AM      Component Value Range Comment   Iron 16 (*) 42 - 135 ug/dL    TIBC 161 (*) 096 - 045 ug/dL    Saturation Ratios 11 (*) 20 - 55 %    UIBC 128  125 - 400 ug/dL   VITAMIN W09     Status: Normal   Collection Time   09/19/12  6:05 AM      Component Value Range Comment   Vitamin B-12 336  211 - 911 pg/mL   CK TOTAL AND CKMB     Status: Normal   Collection Time   09/19/12  6:06 PM      Component Value Range Comment   Total CK 21  7 - 177 U/L    CK, MB 1.5  0.3 - 4.0 ng/mL    Relative Index RELATIVE INDEX IS INVALID  0.0 - 2.5      HPI :71 year-old female with known history of ESRD on hemodialysis on Tuesday Thursdays and Saturday has been experiencing shortness of breath since this afternoon. Patient is short of breath at rest. Denies any fever chills productive cough chest pain. Patient has been having some nausea vomiting for the last 4-5 days, after her flu shot. Denies any abdominal  pain or diarrhea. In the ER patient had chest x-ray did not show any acute. Her hemoglobin was found to be around 7 and her initial hemoglobin on i-STAT was around 6.5. Patient's hemoglobin last month was 13. Patient otherwise is not acutely short of breath at this time and denies any chest pain. Patient has been admitted for severe anemia. Patient denies any blood in the vomitus and has not noticed any black stools or use any NSAIDs.  Upon review of her chart patient does have history of autoimmune hemolytic anemia. Patient states she has gone to hematologist previously for this many years ago.   HOSPITAL COURSE: #1 anemia status post transfusion of 2 units of packed red blood cells with a hematology consult for history of autoimmune hemolytic anemia. Patient was found to have a low iron saturation of 11% and total iron of 16. The patient received Feraheme today, given by Dr. Josph Macho, MD. She would need another dose of 510 mg in one week. Erythropoietin levels are still pending. No indication for a bone marrow biopsy. No evidence of relapse of Evans syndrome. Nothing to suggest  a hematologic malignancy or myelodysplasia   #2 shortness of breath secondary to pulmonary edema. Improved following hemodialysis.  #3 transient SVT with PACs and not atrial fibrillation. The patient has been started on Toprol-XL. She has remained in sinus rhythm. There no documented EKGs showing that she developed atrial fibrillation at any point in time. She received one dose of IV diltiazem and was found to be in sinus rhythm overnight. I doubt that she had atrial fibrillation.  #4 metabolic bone disease on Zemplar and Sensipar and  Renvela  #5 end-stage renal disease on hemodialysis Tuesday Thursday Saturday at @ [facility], K 4. HD tomorrow   #6 diastolic heart failure exacerbation in the setting of volume overload/end-stage renal disease now significantly improved Will evaluate for home oxygen prior to  discharge  Discharge Exam:  Blood pressure 133/76, pulse 87, temperature 97.8 F (36.6 C), temperature source Oral, resp. rate 20, height 5\' 4"  (1.626 m), weight 74.1 kg (163 lb 5.8 oz), SpO2 98.00%.   General appearance: Alert, in no apparent distress  Resp: CTA without rales, rhonchi, or wheezes  Cardio: RRR without murmur  GI: + BS, soft and nontender  Extremities: No edema  Access: AVF @ LUA with + bruit       Signed: Stephene Alegria 09/20/2012, 1:01 PM

## 2012-09-20 NOTE — Progress Notes (Signed)
Mrs. Jardon apparently had episode of atrial fibrillation. This morning was he was alert. No complaints of chest pain.  With the anemia studies that were done, I have to believe that she is iron deficient. Her iron saturation was only 11%. Her total iron is 16. Her ferritin is quite high. I have to believe this is an acute phase reactant.  She has normal B12. There is no hemolysis.  We'll go ahead and give her a dose of iron via Feraheme. I would give her a dose of 510 mg today. I would repeat in one week with another 510 mg.  She does not have dialysis today.  She apparently has MRSA. I don't think she has a active MRSA infection. 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  All of her vital signs are stable. Blood pressure 140/77. Her lungs sound clear. There may be some slight decrease at the bases. No wheezes are noted. Cardiac exam regular rate and rhythm with an occasional extra beat. Abdominal exam soft. She good bowel sounds. There is no distention. There is no hepatospleno megaly. Extremities shows no clubbing cyanosis or edema. Skin exam shows her skin to be somewhat dry. She does have some ecchymoses on her right arm.  Again, I would give iron a try for Ms. Greenwood. I still do not see a need for a bone marrow biopsy. I don't see anything that would suggest a relapse of her Evans syndrome. I don't see anything that would suggest a hematologic malignancy or myelodysplasia   It will be interesting is to see what her erythropoietin level is. This is not back yet. I suppose she may need a higher dose of ESA in the future. If her erythropoietin level is low.  We had good fellowship. Ms. Ohman has a lot of faith.  Pete E.

## 2012-09-20 NOTE — Progress Notes (Signed)
Subjective:   No current complaints, vomited again last night, still no BM since admission.  Objective: Vital signs in last 24 hours: Temp:  [97 F (36.1 C)-98.3 F (36.8 C)] 97.5 F (36.4 C) (09/25 0532) Pulse Rate:  [80-94] 92  (09/25 0532) Resp:  [17-23] 18  (09/25 0532) BP: (96-143)/(62-77) 140/77 mmHg (09/25 0532) SpO2:  [80 %-99 %] 92 % (09/25 0741) Weight:  [74.1 kg (163 lb 5.8 oz)-75.75 kg (167 lb)] 74.1 kg (163 lb 5.8 oz) (09/24 1830) Weight change: -5.75 kg (-12 lb 10.8 oz)  Intake/Output from previous day: 09/24 0701 - 09/25 0700 In: 60 [P.O.:60] Out: 1650    EXAM: General appearance:  Alert, in no apparent distress Resp:  CTA without rales, rhonchi, or wheezes Cardio:  RRR without murmur GI:  + BS, soft and nontender Extremities:  No edema Access:  AVF @ LUA with + bruit  Lab Results:  [facility] 09/19/12 0605 09/18/12 0620  WBC 6.1 6.8  HGB 9.8* 10.3*  HCT 30.7* 32.3*  PLT 181 169   BMET:  Basename 09/19/12 0605 09/18/12 0620  NA 140 143  K 4.0 4.0  CL 97 99  CO2 31 35*  GLUCOSE 97 101*  BUN 13 15  CREATININE 4.20* 6.09*  CALCIUM 9.5 9.3  ALBUMIN 3.2* 3.1*   No results found for this basename: PTH:2 in the last 72 hours Iron Studies:  Basename 09/19/12 0605  IRON 16*  TIBC 144*  TRANSFERRIN --  FERRITIN 1032*   Dialysis Orders: Center: [facility] on TTS.  EDW 80.5 kg HD Bath 2K/2.25Ca Time 3 hrs 45 mins Heparin 4000 U. Access AVF @ LUA BFR 450 DFR 800  Zemplar 5 mcg IV/HD Epogen 19,800 Units IV/HD Venofer 0.   Assessment/Plan:  1. Dyspnea/chest pain - pulm edema from lean body weight loss, she is now 5-6 below prior dry weight. Will need new EDW at discharge.   2. Transient afib- during HD yesterday evening, she was cramping and had BP shifts which may have caused this. Got IV dilt x 1, is back in NSR today. Per primary.  3. ESRD - HD on TTS @ [facility], K 4. HD tomorrow. 4. Hypertension/volume - BP most recently 140/77, on Amlodipine 10 mg qhs; post-HD  wt 74.1 kg s/p net UF of 1650 ml yesterday, outpt EDW 80.5 kg. 5. Anemia - Hgb 9.8 this AM s/p 2 U PRBCs yesterday for Hgb 7; outpt Epogen 19,800 U, s/p Aranesp 200 mcg yesterday; seen by Dr. Monika Salk (previously seen in late '90s for autoimmune hemolytic anemia and thrombocytopenia), Fe sat 11%, ferritin 1032, will receive Feraheme per Dr. Monika Salk. 6. Metabolic bone disease - Ca 9.5 (10.1 corrected), P 4.4, on Zemplar 5 mcg, Sensipar 30 mg qd, and Renvela. 7. Nutrition - Alb 3.2. 8. Hypothyroidism - on Synthroid.   LOS: 3 days   MeredithReynolds 09/20/2012,7:58 AM  Patient seen and examined and agree with assessment and plan as above with additions as indicated. Patient is improved from a respiratory and volume standpoint, we have reached a new baseline and weren't able to remove a lot of volume yesterday. Plan HD tomorrow if still here.  [name]  MD [facility] [phone] pgr    [phone] cell 09/20/2012, 12:52 PM

## 2012-09-20 NOTE — Progress Notes (Signed)
09/20/2012 patient have history of COPD going home with oxygen 2 liters continuous. Gloriajean Dell RN

## 2012-09-20 NOTE — Progress Notes (Signed)
Utilization review completed.  

## 2012-09-20 NOTE — Progress Notes (Addendum)
09/20/2012 at rest patient saturation was 79% to 81% on room air. Walking in the hallway saturation was 80% on room air.Gloriajean Dell RN

## 2012-09-20 NOTE — Progress Notes (Signed)
   CARE MANAGEMENT NOTE 09/20/2012  Patient:  [name], [name]   Account Number:  [id number]  Date Initiated:  09/20/2012  Documentation initiated by:  Darlyne Russian  Subjective/Objective Assessment:   Patient admitted with anemia     Action/Plan:   Progression of care and discharge planning   Anticipated DC Date:     Anticipated DC Plan:        DC Planning Services  CM consult      Choice offered to / List presented to:  C-4 Adult Children   DME arranged  [facility]      DME agency  [facility]        Status of service:  In process, will continue to follow Medicare Important Message given?   (If response is "NO", the following Medicare IM given date fields will be blank) Date Medicare IM given:   Date Additional Medicare IM given:    Discharge Disposition:    Per UR Regulation:  Reviewed for med. necessity/level of care/duration of stay  If discussed at Long Length of Stay Meetings, dates discussed:    Comments:  09/20/2012  [address] RN, [place] [phone] CM referral   Home Oxygen  Spoke with daughter Massie Bougie  [phone] regarding home oxygen. She noted [facility] is providing O2 at the home for her dad and will use [facility] for mom.  [facility]  called with referral for home O2

## 2012-09-26 ENCOUNTER — Inpatient Hospital Stay: Payer: Self-pay | Admitting: Internal Medicine

## 2012-09-26 LAB — PHOSPHORUS
Phosphorus: 1.8 mg/dL — ABNORMAL LOW (ref 2.5–4.9)
Phosphorus: 4.7 mg/dL (ref 2.5–4.9)

## 2012-09-26 LAB — COMPREHENSIVE METABOLIC PANEL
Alkaline Phosphatase: 85 U/L (ref 50–136)
Anion Gap: 18 — ABNORMAL HIGH (ref 7–16)
Bilirubin,Total: 0.8 mg/dL (ref 0.2–1.0)
Calcium, Total: 9.4 mg/dL (ref 8.5–10.1)
Chloride: 98 mmol/L (ref 98–107)
Co2: 20 mmol/L — ABNORMAL LOW (ref 21–32)
EGFR (African American): 4 — ABNORMAL LOW
EGFR (Non-African Amer.): 4 — ABNORMAL LOW
Osmolality: 285 (ref 275–301)
Sodium: 136 mmol/L (ref 136–145)

## 2012-09-26 LAB — TROPONIN I: Troponin-I: <0.02 ng/mL

## 2012-09-26 LAB — CBC
HGB: 11.6 g/dL — ABNORMAL LOW (ref 12.0–16.0)
RBC: 3.72 10*6/uL — ABNORMAL LOW (ref 3.80–5.20)
WBC: 10.4 10*3/uL (ref 3.6–11.0)

## 2012-09-26 LAB — APTT: Activated PTT: 40.7 secs — ABNORMAL HIGH (ref 23.6–35.9)

## 2012-09-26 LAB — PROTIME-INR: INR: 1.1

## 2012-09-26 LAB — MRSA PCR SCREENING

## 2012-09-26 LAB — MAGNESIUM: Magnesium: 2 mg/dL

## 2012-09-26 LAB — CK TOTAL AND CKMB (NOT AT ARMC): CK-MB: <0.5 ng/mL — ABNORMAL LOW (ref 0.5–3.6)

## 2012-09-27 LAB — COMPREHENSIVE METABOLIC PANEL
Albumin: 2.5 g/dL — ABNORMAL LOW (ref 3.4–5.0)
Alkaline Phosphatase: 84 U/L (ref 50–136)
Anion Gap: 7 (ref 7–16)
BUN: 21 mg/dL — ABNORMAL HIGH (ref 7–18)
Calcium, Total: 9.1 mg/dL (ref 8.5–10.1)
Chloride: 96 mmol/L — ABNORMAL LOW (ref 98–107)
EGFR (African American): 9 — ABNORMAL LOW
Glucose: 84 mg/dL (ref 65–99)
Potassium: 3.9 mmol/L (ref 3.5–5.1)
SGOT(AST): 13 U/L — ABNORMAL LOW (ref 15–37)
Sodium: 136 mmol/L (ref 136–145)
Total Protein: 6.6 g/dL (ref 6.4–8.2)

## 2012-09-27 LAB — CBC WITH DIFFERENTIAL/PLATELET
Basophil %: 0.8 %
Eosinophil %: 1.2 %
HGB: 10.3 g/dL — ABNORMAL LOW (ref 12.0–16.0)
MCH: 31.6 pg (ref 26.0–34.0)
Monocyte #: 0.5 x10 3/mm (ref 0.2–0.9)
Monocyte %: 7.6 %
Neutrophil #: 5.4 10*3/uL (ref 1.4–6.5)
Neutrophil %: 79.5 %
RBC: 3.27 10*6/uL — ABNORMAL LOW (ref 3.80–5.20)
WBC: 6.8 10*3/uL (ref 3.6–11.0)

## 2012-09-27 LAB — OCCULT BLOOD X 1 CARD TO LAB, STOOL: Occult Blood, Feces: POSITIVE

## 2012-09-28 LAB — CBC WITH DIFFERENTIAL/PLATELET
Basophil #: 0.2 10*3/uL — ABNORMAL HIGH (ref 0.0–0.1)
Basophil %: 4.9 %
Eosinophil %: 5.3 %
HCT: 28.9 % — ABNORMAL LOW (ref 35.0–47.0)
HGB: 9.8 g/dL — ABNORMAL LOW (ref 12.0–16.0)
Lymphocyte #: 1.3 10*3/uL (ref 1.0–3.6)
MCH: 31.1 pg (ref 26.0–34.0)
MCHC: 34 g/dL (ref 32.0–36.0)
MCV: 91 fL (ref 80–100)
Monocyte #: 0.4 x10 3/mm (ref 0.2–0.9)
Monocyte %: 7.5 %
Neutrophil #: 2.8 10*3/uL (ref 1.4–6.5)
Neutrophil %: 56.4 %
Platelet: 186 10*3/uL (ref 150–440)
WBC: 5 10*3/uL (ref 3.6–11.0)

## 2012-09-28 LAB — PHOSPHORUS: Phosphorus: 3.9 mg/dL (ref 2.5–4.9)

## 2012-10-02 LAB — CULTURE, BLOOD (SINGLE)

## 2012-11-20 ENCOUNTER — Ambulatory Visit: Payer: Self-pay | Admitting: Gastroenterology

## 2012-11-26 ENCOUNTER — Ambulatory Visit: Payer: Self-pay | Admitting: Hematology and Oncology

## 2012-11-26 HISTORY — PX: COLONOSCOPY: SHX174

## 2012-12-04 ENCOUNTER — Ambulatory Visit: Payer: Self-pay | Admitting: Gastroenterology

## 2012-12-04 LAB — BASIC METABOLIC PANEL
Anion Gap: 8 (ref 7–16)
Chloride: 104 mmol/L (ref 98–107)
Co2: 26 mmol/L (ref 21–32)
Creatinine: 7.77 mg/dL — ABNORMAL HIGH (ref 0.60–1.30)
Osmolality: 282 (ref 275–301)
Potassium: 5.6 mmol/L — ABNORMAL HIGH (ref 3.5–5.1)
Sodium: 138 mmol/L (ref 136–145)

## 2012-12-04 LAB — POTASSIUM: Potassium: 5.5 mmol/L — ABNORMAL HIGH (ref 3.5–5.1)

## 2012-12-05 ENCOUNTER — Inpatient Hospital Stay: Payer: Self-pay | Admitting: Surgery

## 2012-12-05 LAB — BASIC METABOLIC PANEL
Calcium, Total: 8.4 mg/dL — ABNORMAL LOW (ref 8.5–10.1)
Chloride: 105 mmol/L (ref 98–107)
Creatinine: 4.11 mg/dL — ABNORMAL HIGH (ref 0.60–1.30)
EGFR (African American): 12 — ABNORMAL LOW
Osmolality: 279 (ref 275–301)
Potassium: 3.5 mmol/L (ref 3.5–5.1)

## 2012-12-05 LAB — CBC
HGB: 11.4 g/dL — ABNORMAL LOW (ref 12.0–16.0)
MCH: 32.7 pg (ref 26.0–34.0)
MCHC: 33.5 g/dL (ref 32.0–36.0)
MCV: 98 fL (ref 80–100)
Platelet: 198 10*3/uL (ref 150–440)

## 2012-12-05 LAB — CK TOTAL AND CKMB (NOT AT ARMC): CK, Total: 35 U/L (ref 21–215)

## 2012-12-05 LAB — TSH: Thyroid Stimulating Horm: 4.82 u[IU]/mL — ABNORMAL HIGH

## 2012-12-05 LAB — TROPONIN I: Troponin-I: <0.02 ng/mL

## 2012-12-05 LAB — PROTIME-INR: INR: 1

## 2012-12-06 ENCOUNTER — Other Ambulatory Visit: Payer: Self-pay | Admitting: *Deleted

## 2012-12-06 LAB — CBC WITH DIFFERENTIAL/PLATELET
Eosinophil #: 0 10*3/uL (ref 0.0–0.7)
HCT: 31.3 % — ABNORMAL LOW (ref 35.0–47.0)
Lymphocyte #: 0.7 10*3/uL — ABNORMAL LOW (ref 1.0–3.6)
Lymphocyte %: 9.5 %
MCHC: 31.8 g/dL — ABNORMAL LOW (ref 32.0–36.0)
Neutrophil #: 6.3 10*3/uL (ref 1.4–6.5)
Neutrophil %: 83.1 %
Platelet: 152 10*3/uL (ref 150–440)
RDW: 17.2 % — ABNORMAL HIGH (ref 11.5–14.5)

## 2012-12-06 LAB — BASIC METABOLIC PANEL
Anion Gap: 11 (ref 7–16)
Chloride: 107 mmol/L (ref 98–107)
EGFR (African American): 8 — ABNORMAL LOW
EGFR (Non-African Amer.): 7 — ABNORMAL LOW
Potassium: 4.6 mmol/L (ref 3.5–5.1)

## 2012-12-07 LAB — COMPREHENSIVE METABOLIC PANEL
Albumin: 2.9 g/dL — ABNORMAL LOW (ref 3.4–5.0)
Alkaline Phosphatase: 72 U/L (ref 50–136)
Anion Gap: 10 (ref 7–16)
BUN: 17 mg/dL (ref 7–18)
Calcium, Total: 8.7 mg/dL (ref 8.5–10.1)
Creatinine: 4.91 mg/dL — ABNORMAL HIGH (ref 0.60–1.30)
Glucose: 95 mg/dL (ref 65–99)
SGOT(AST): 11 U/L — ABNORMAL LOW (ref 15–37)
SGPT (ALT): 8 U/L — ABNORMAL LOW (ref 12–78)
Total Protein: 6.6 g/dL (ref 6.4–8.2)

## 2012-12-07 LAB — CBC WITH DIFFERENTIAL/PLATELET
Basophil %: 1.1 %
Eosinophil %: 1.6 %
HCT: 27.9 % — ABNORMAL LOW (ref 35.0–47.0)
HGB: 9.3 g/dL — ABNORMAL LOW (ref 12.0–16.0)
Lymphocyte #: 0.6 10*3/uL — ABNORMAL LOW (ref 1.0–3.6)
Lymphocyte %: 9.2 %
MCH: 33 pg (ref 26.0–34.0)
MCV: 99 fL (ref 80–100)
Monocyte #: 0.4 x10 3/mm (ref 0.2–0.9)
Monocyte %: 5.2 %
Neutrophil #: 5.7 10*3/uL (ref 1.4–6.5)
Platelet: 145 10*3/uL — ABNORMAL LOW (ref 150–440)
RBC: 2.82 10*6/uL — ABNORMAL LOW (ref 3.80–5.20)
RDW: 16.3 % — ABNORMAL HIGH (ref 11.5–14.5)

## 2012-12-07 LAB — PROTIME-INR
INR: 1.2
Prothrombin Time: 16 secs — ABNORMAL HIGH (ref 11.5–14.7)

## 2012-12-07 LAB — PHOSPHORUS: Phosphorus: 6.6 mg/dL — ABNORMAL HIGH (ref 2.5–4.9)

## 2012-12-08 LAB — CBC WITH DIFFERENTIAL/PLATELET
Basophil #: 0 10*3/uL (ref 0.0–0.1)
Eosinophil #: 0 10*3/uL (ref 0.0–0.7)
HCT: 27.6 % — ABNORMAL LOW (ref 35.0–47.0)
Lymphocyte #: 0.7 10*3/uL — ABNORMAL LOW (ref 1.0–3.6)
MCH: 33.6 pg (ref 26.0–34.0)
MCHC: 33.9 g/dL (ref 32.0–36.0)
MCV: 99 fL (ref 80–100)
Monocyte #: 0.7 x10 3/mm (ref 0.2–0.9)
Monocyte %: 6.4 %
Neutrophil #: 9.9 10*3/uL — ABNORMAL HIGH (ref 1.4–6.5)
Neutrophil %: 87.2 %
Platelet: 156 10*3/uL (ref 150–440)
RDW: 16.7 % — ABNORMAL HIGH (ref 11.5–14.5)
WBC: 11.3 10*3/uL — ABNORMAL HIGH (ref 3.6–11.0)

## 2012-12-08 LAB — BASIC METABOLIC PANEL
Anion Gap: 11 (ref 7–16)
BUN: 26 mg/dL — ABNORMAL HIGH (ref 7–18)
Calcium, Total: 9 mg/dL (ref 8.5–10.1)
Chloride: 97 mmol/L — ABNORMAL LOW (ref 98–107)
Creatinine: 6.57 mg/dL — ABNORMAL HIGH (ref 0.60–1.30)
EGFR (African American): 7 — ABNORMAL LOW
Osmolality: 276 (ref 275–301)

## 2012-12-09 DIAGNOSIS — I369 Nonrheumatic tricuspid valve disorder, unspecified: Secondary | ICD-10-CM

## 2012-12-09 LAB — CBC WITH DIFFERENTIAL/PLATELET
Basophil #: 0 10*3/uL (ref 0.0–0.1)
Basophil %: 0.2 %
Eosinophil %: 1.7 %
HCT: 24 % — ABNORMAL LOW (ref 35.0–47.0)
HGB: 8.2 g/dL — ABNORMAL LOW (ref 12.0–16.0)
Lymphocyte #: 0.6 10*3/uL — ABNORMAL LOW (ref 1.0–3.6)
MCH: 34 pg (ref 26.0–34.0)
MCV: 99 fL (ref 80–100)
Monocyte #: 0.4 x10 3/mm (ref 0.2–0.9)
Monocyte %: 7.6 %
Platelet: 146 10*3/uL — ABNORMAL LOW (ref 150–440)
RBC: 2.42 10*6/uL — ABNORMAL LOW (ref 3.80–5.20)
RDW: 16.4 % — ABNORMAL HIGH (ref 11.5–14.5)
WBC: 5.5 10*3/uL (ref 3.6–11.0)

## 2012-12-09 LAB — BASIC METABOLIC PANEL
BUN: 34 mg/dL — ABNORMAL HIGH (ref 7–18)
Calcium, Total: 8.8 mg/dL (ref 8.5–10.1)
Chloride: 95 mmol/L — ABNORMAL LOW (ref 98–107)
Osmolality: 275 (ref 275–301)
Potassium: 4.9 mmol/L (ref 3.5–5.1)
Sodium: 134 mmol/L — ABNORMAL LOW (ref 136–145)

## 2012-12-09 LAB — PHOSPHORUS: Phosphorus: 7.3 mg/dL — ABNORMAL HIGH (ref 2.5–4.9)

## 2012-12-10 MED ORDER — SODIUM CHLORIDE 0.9 % IJ SOLN: 3.0000 mL | INTRAMUSCULAR | Status: DC | PRN

## 2012-12-11 ENCOUNTER — Encounter (HOSPITAL_COMMUNITY): Admission: RE | Payer: Self-pay | Source: Ambulatory Visit

## 2012-12-11 ENCOUNTER — Ambulatory Visit (HOSPITAL_COMMUNITY): Admission: RE | Admit: 2012-12-11 | Payer: Medicare Other | Source: Ambulatory Visit | Admitting: Vascular Surgery

## 2012-12-11 LAB — CBC WITH DIFFERENTIAL/PLATELET
Basophil #: 0 10*3/uL (ref 0.0–0.1)
Eosinophil #: 0.3 10*3/uL (ref 0.0–0.7)
Lymphocyte #: 0.4 10*3/uL — ABNORMAL LOW (ref 1.0–3.6)
Lymphocyte %: 7.5 %
MCHC: 32.9 g/dL (ref 32.0–36.0)
MCV: 98 fL (ref 80–100)
Monocyte %: 8.2 %
Neutrophil %: 77.9 %
Platelet: 199 10*3/uL (ref 150–440)
RBC: 3 10*6/uL — ABNORMAL LOW (ref 3.80–5.20)
RDW: 16 % — ABNORMAL HIGH (ref 11.5–14.5)
WBC: 4.8 10*3/uL (ref 3.6–11.0)

## 2012-12-11 SURGERY — ASSESSMENT, SHUNT FUNCTION, WITH CONTRAST RADIOGRAPHIC STUDY
Anesthesia: LOCAL

## 2012-12-12 LAB — CBC WITH DIFFERENTIAL/PLATELET
Basophil #: 0 10*3/uL (ref 0.0–0.1)
Eosinophil %: 5.9 %
HGB: 9.5 g/dL — ABNORMAL LOW (ref 12.0–16.0)
Lymphocyte #: 0.3 10*3/uL — ABNORMAL LOW (ref 1.0–3.6)
MCH: 32.9 pg (ref 26.0–34.0)
MCV: 98 fL (ref 80–100)
Monocyte #: 0.3 x10 3/mm (ref 0.2–0.9)
Neutrophil #: 3 10*3/uL (ref 1.4–6.5)
Neutrophil %: 76 %
Platelet: 192 10*3/uL (ref 150–440)
RBC: 2.88 10*6/uL — ABNORMAL LOW (ref 3.80–5.20)
RDW: 16.4 % — ABNORMAL HIGH (ref 11.5–14.5)
WBC: 4 10*3/uL (ref 3.6–11.0)

## 2012-12-12 LAB — BASIC METABOLIC PANEL
Anion Gap: 12 (ref 7–16)
BUN: 47 mg/dL — ABNORMAL HIGH (ref 7–18)
Calcium, Total: 9.7 mg/dL (ref 8.5–10.1)
Chloride: 94 mmol/L — ABNORMAL LOW (ref 98–107)
Co2: 26 mmol/L (ref 21–32)
Creatinine: 8.67 mg/dL — ABNORMAL HIGH (ref 0.60–1.30)
Osmolality: 276 (ref 275–301)
Potassium: 3.7 mmol/L (ref 3.5–5.1)

## 2012-12-12 LAB — PHOSPHORUS: Phosphorus: 6.2 mg/dL — ABNORMAL HIGH (ref 2.5–4.9)

## 2012-12-14 ENCOUNTER — Other Ambulatory Visit: Payer: Self-pay | Admitting: *Deleted

## 2012-12-14 ENCOUNTER — Encounter (HOSPITAL_COMMUNITY): Payer: Self-pay | Admitting: *Deleted

## 2012-12-14 ENCOUNTER — Encounter (HOSPITAL_COMMUNITY)
Admission: RE | Disposition: A | Discharge status: Home / Self Care | Payer: Self-pay | Source: Ambulatory Visit | Attending: Vascular Surgery

## 2012-12-14 ENCOUNTER — Encounter (HOSPITAL_COMMUNITY): Payer: Self-pay | Admitting: Anesthesiology

## 2012-12-14 ENCOUNTER — Ambulatory Visit (HOSPITAL_COMMUNITY): Payer: Medicare Other

## 2012-12-14 ENCOUNTER — Ambulatory Visit (HOSPITAL_COMMUNITY)
Admission: RE | Admit: 2012-12-14 | Discharge: 2012-12-14 | Disposition: A | Discharge status: Home / Self Care | Payer: Medicare Other | Source: Ambulatory Visit | Attending: Vascular Surgery | Admitting: Vascular Surgery

## 2012-12-14 ENCOUNTER — Other Ambulatory Visit: Payer: Self-pay | Admitting: Nephrology

## 2012-12-14 ENCOUNTER — Other Ambulatory Visit: Payer: Self-pay

## 2012-12-14 DIAGNOSIS — Z538 Procedure and treatment not carried out for other reasons: Secondary | ICD-10-CM | POA: Insufficient documentation

## 2012-12-14 DIAGNOSIS — T82898A Other specified complication of vascular prosthetic devices, implants and grafts, initial encounter: Secondary | ICD-10-CM | POA: Insufficient documentation

## 2012-12-14 DIAGNOSIS — N186 End stage renal disease: Secondary | ICD-10-CM | POA: Insufficient documentation

## 2012-12-14 DIAGNOSIS — I120 Hypertensive chronic kidney disease with stage 5 chronic kidney disease or end stage renal disease: Secondary | ICD-10-CM | POA: Insufficient documentation

## 2012-12-14 DIAGNOSIS — Y832 Surgical operation with anastomosis, bypass or graft as the cause of abnormal reaction of the patient, or of later complication, without mention of misadventure at the time of the procedure: Secondary | ICD-10-CM | POA: Insufficient documentation

## 2012-12-14 DIAGNOSIS — Z87891 Personal history of nicotine dependence: Secondary | ICD-10-CM | POA: Insufficient documentation

## 2012-12-14 DIAGNOSIS — E039 Hypothyroidism, unspecified: Secondary | ICD-10-CM | POA: Insufficient documentation

## 2012-12-14 DIAGNOSIS — J449 Chronic obstructive pulmonary disease, unspecified: Secondary | ICD-10-CM | POA: Insufficient documentation

## 2012-12-14 DIAGNOSIS — J4489 Other specified chronic obstructive pulmonary disease: Secondary | ICD-10-CM | POA: Insufficient documentation

## 2012-12-14 DIAGNOSIS — Z992 Dependence on renal dialysis: Secondary | ICD-10-CM | POA: Insufficient documentation

## 2012-12-14 HISTORY — DX: Diverticulitis of intestine, part unspecified, without perforation or abscess without bleeding: K57.92

## 2012-12-14 LAB — POCT I-STAT 4, (NA,K, GLUC, HGB,HCT)
Potassium: 3.8 mEq/L (ref 3.5–5.1)
Sodium: 134 mEq/L — ABNORMAL LOW (ref 135–145)

## 2012-12-14 SURGERY — CANCELLED PROCEDURE

## 2012-12-14 MED ORDER — SODIUM CHLORIDE 0.9 % IV SOLN
INTRAVENOUS | Status: DC
  Administered 2012-12-14: 14:00:00 via INTRAVENOUS

## 2012-12-14 MED ORDER — MIDAZOLAM HCL 2 MG/2ML IJ SOLN: 1.0000 mg | INTRAMUSCULAR | Status: DC | PRN

## 2012-12-14 MED ORDER — MUPIROCIN 2 % EX OINT: TOPICAL_OINTMENT | Freq: Once | CUTANEOUS | Status: DC

## 2012-12-14 MED ORDER — MUPIROCIN 2 % EX OINT
TOPICAL_OINTMENT | CUTANEOUS | Status: AC
  Filled 2012-12-14: qty 22

## 2012-12-14 MED ORDER — FENTANYL CITRATE 0.05 MG/ML IJ SOLN: 50.0000 ug | Freq: Once | INTRAMUSCULAR | Status: DC

## 2012-12-14 SURGICAL SUPPLY — 35 items
ADH SKN CLS APL DERMABOND .7 (GAUZE/BANDAGES/DRESSINGS) ×1
CANISTER SUCTION 2500CC (MISCELLANEOUS) ×3 IMPLANT
CATH EMB 4FR 80CM (CATHETERS) ×3 IMPLANT
CLIP TI MEDIUM 6 (CLIP) ×3 IMPLANT
CLIP TI WIDE RED SMALL 6 (CLIP) ×3 IMPLANT
CLOTH BEACON ORANGE TIMEOUT ST (SAFETY) ×3 IMPLANT
COVER SURGICAL LIGHT HANDLE (MISCELLANEOUS) ×3 IMPLANT
DERMABOND ADVANCED (GAUZE/BANDAGES/DRESSINGS) ×1
DERMABOND ADVANCED .7 DNX12 (GAUZE/BANDAGES/DRESSINGS) ×2 IMPLANT
DRAPE X-RAY CASS 24X20 (DRAPES) IMPLANT
ELECT REM PT RETURN 9FT ADLT (ELECTROSURGICAL) ×2
ELECTRODE REM PT RTRN 9FT ADLT (ELECTROSURGICAL) ×2 IMPLANT
GAUZE SPONGE 4X4 16PLY XRAY LF (GAUZE/BANDAGES/DRESSINGS) IMPLANT
GEL ULTRASOUND 20GR AQUASONIC (MISCELLANEOUS) IMPLANT
GLOVE BIO SURGEON STRL SZ7.5 (GLOVE) ×3 IMPLANT
GLOVE BIOGEL PI IND STRL 8 (GLOVE) ×2 IMPLANT
GLOVE BIOGEL PI INDICATOR 8 (GLOVE) ×1
GOWN STRL NON-REIN LRG LVL3 (GOWN DISPOSABLE) ×6 IMPLANT
KIT BASIN OR (CUSTOM PROCEDURE TRAY) ×3 IMPLANT
KIT ROOM TURNOVER OR (KITS) ×3 IMPLANT
NS IRRIG 1000ML POUR BTL (IV SOLUTION) ×3 IMPLANT
PACK CV ACCESS (CUSTOM PROCEDURE TRAY) ×3 IMPLANT
PAD ARMBOARD 7.5X6 YLW CONV (MISCELLANEOUS) ×6 IMPLANT
SET COLLECT BLD 21X3/4 12 (NEEDLE) IMPLANT
SPONGE SURGIFOAM ABS GEL 100 (HEMOSTASIS) IMPLANT
STOPCOCK 4 WAY LG BORE MALE ST (IV SETS) IMPLANT
SUT PROLENE 6 0 BV (SUTURE) ×3 IMPLANT
SUT VIC AB 3-0 SH 27 (SUTURE) ×2
SUT VIC AB 3-0 SH 27X BRD (SUTURE) ×2 IMPLANT
SUT VICRYL 4-0 PS2 18IN ABS (SUTURE) ×3 IMPLANT
TOWEL OR 17X24 6PK STRL BLUE (TOWEL DISPOSABLE) ×3 IMPLANT
TOWEL OR 17X26 10 PK STRL BLUE (TOWEL DISPOSABLE) ×3 IMPLANT
TUBING EXTENTION W/L.L. (IV SETS) IMPLANT
UNDERPAD 30X30 INCONTINENT (UNDERPADS AND DIAPERS) ×3 IMPLANT
WATER STERILE IRR 1000ML POUR (IV SOLUTION) ×3 IMPLANT

## 2012-12-14 NOTE — Pre-Procedure Instructions (Signed)
20 [name]  12/14/2012   Your procedure is scheduled on:  December 20  Report to [facility] at 11:00 AM.  Do not eat or drink after 5 am.  Take your normal morning medications with a small sip of water.  Please bring someone to drive you home and to be with you after the procedure.

## 2012-12-14 NOTE — Progress Notes (Signed)
Per Okey Regal and IR patient to be given instructions to be NPO past 5 am today, take am meds with sip of water, report to [facility] at 11 am, and have a driver. Typed instruction sheet and will give to patient prior to leaving.

## 2012-12-14 NOTE — Anesthesia Preprocedure Evaluation (Deleted)
Anesthesia Evaluation  Patient identified by MRN, date of birth, ID band Patient awake    Reviewed: Allergy & Precautions, H&P , NPO status , Patient's Chart, lab work & pertinent test results  Airway Mallampati: II TM Distance: >3 FB Neck ROM: Full    Dental   Pulmonary COPDformer smoker,  breath sounds clear to auscultation        Cardiovascular hypertension, Rhythm:Regular Rate:Normal     Neuro/Psych    GI/Hepatic   Endo/Other  Hypothyroidism   Renal/GU ESRFRenal disease     Musculoskeletal   Abdominal (+) - obese,   Peds  Hematology   Anesthesia Other Findings   Reproductive/Obstetrics                           Anesthesia Physical Anesthesia Plan  ASA: III  Anesthesia Plan: MAC   Post-op Pain Management:    Induction: Intravenous  Airway Management Planned: Simple Face Mask  Additional Equipment:   Intra-op Plan:   Post-operative Plan:   Informed Consent: I have reviewed the patients History and Physical, chart, labs and discussed the procedure including the risks, benefits and alternatives for the proposed anesthesia with the patient or authorized representative who has indicated his/her understanding and acceptance.     Plan Discussed with: CRNA and Surgeon  Anesthesia Plan Comments:         Anesthesia Quick Evaluation

## 2012-12-14 NOTE — Progress Notes (Signed)
VASCULAR SURGERY:  This patient had a left basilic vein transposition placed in 2012. She's had one previous fistulogram with venoplasty of an outflow stenosis. I received a message while I was in surgery today that she had a clotted fistula and that she was not a candidate for thrombolysis because of an issue with heparin. When I examined the patient her left upper arm fistula was patent although pulsatile suggesting an outflow stenosis. I did not think there was an indication for surgical thrombectomy and I felt if she does have an outflow stenosis that this would best be addressed with venoplasty. This reason her surgery was canceled and we will schedule her for life fistulogram with possible venoplasty if she has a significant outflow stenosis. If the fistula cannot be salvaged then she should have a catheter placed and can be evaluated for new access.  Cari Caraway Beeper [phone] 12/14/2012

## 2012-12-15 ENCOUNTER — Ambulatory Visit (HOSPITAL_COMMUNITY)
Admit: 2012-12-15 | Discharge: 2012-12-15 | Disposition: A | Discharge status: Home / Self Care | Payer: Medicare Other | Source: Ambulatory Visit | Attending: Vascular Surgery | Admitting: Vascular Surgery

## 2012-12-15 ENCOUNTER — Other Ambulatory Visit: Payer: Self-pay | Admitting: Vascular Surgery

## 2012-12-15 DIAGNOSIS — J449 Chronic obstructive pulmonary disease, unspecified: Secondary | ICD-10-CM | POA: Insufficient documentation

## 2012-12-15 DIAGNOSIS — J4489 Other specified chronic obstructive pulmonary disease: Secondary | ICD-10-CM | POA: Insufficient documentation

## 2012-12-15 DIAGNOSIS — Y849 Medical procedure, unspecified as the cause of abnormal reaction of the patient, or of later complication, without mention of misadventure at the time of the procedure: Secondary | ICD-10-CM | POA: Insufficient documentation

## 2012-12-15 DIAGNOSIS — N186 End stage renal disease: Secondary | ICD-10-CM | POA: Insufficient documentation

## 2012-12-15 DIAGNOSIS — T82898A Other specified complication of vascular prosthetic devices, implants and grafts, initial encounter: Secondary | ICD-10-CM | POA: Insufficient documentation

## 2012-12-15 DIAGNOSIS — I120 Hypertensive chronic kidney disease with stage 5 chronic kidney disease or end stage renal disease: Secondary | ICD-10-CM | POA: Insufficient documentation

## 2012-12-15 DIAGNOSIS — E785 Hyperlipidemia, unspecified: Secondary | ICD-10-CM | POA: Insufficient documentation

## 2012-12-15 LAB — POCT I-STAT 4, (NA,K, GLUC, HGB,HCT): Sodium: 135 mEq/L (ref 135–145)

## 2012-12-15 MED ORDER — IOHEXOL 300 MG/ML  SOLN
100.0000 mL | Freq: Once | INTRAMUSCULAR | Status: AC | PRN
  Administered 2012-12-15: 50 mL via INTRAVENOUS

## 2012-12-15 NOTE — H&P (Signed)
[name] is an 71 y.o. female.   Chief Complaint: Scheduled for fistulogram. HPI: ESRD and concern for venous stenosis in left arm fistula.  No complaints.  Past Medical History  Diagnosis Date  . Hypertension   . Hyperlipidemia   . Thyroid disease     Hypothyroidism  . Chronic kidney disease   . Autoimmune thrombocytopenia   . COPD (chronic obstructive pulmonary disease)   . Diverticulitis 2013 sept.      hospitalized  at [facility].    Past Surgical History  Procedure Date  . Av fistula placement, brachiocephalic 11/04/2010    right arm  . Abdominal hysterectomy   . Colonoscopy Dec 2013    colon cancer .. surgery  to be 2014    Family History  Problem Relation Age of Onset  . Heart disease Mother   . Cancer Father   . Kidney disease Brother    Social History:  reports that she has quit smoking. Her smoking use included Cigarettes. She does not have any smokeless tobacco history on file. She reports that she does not drink alcohol or use illicit drugs.  Allergies:  Allergies  Allergen Reactions  . Shellfish-Derived Products Anaphylaxis     Results for orders placed during the hospital encounter of 12/15/12 (from the past 48 hour(s))  POCT I-STAT 4, (NA,K, GLUC, HGB,HCT)     Status: Abnormal   Collection Time   12/15/12 12:47 PM      Component Value Range Comment   Sodium 135  135 - 145 mEq/L    Potassium 4.4  3.5 - 5.1 mEq/L    Glucose, Bld 82  70 - 99 mg/dL    HCT 62.1 (*) 30.8 - 46.0 %    Hemoglobin 18.0 (*) 12.0 - 15.0 g/dL      Review of Systems  Constitutional: Negative.   Respiratory: Negative.   Cardiovascular: Negative.     Physical Exam  Cardiovascular: Normal rate and regular rhythm.   Respiratory: Effort normal and breath sounds normal.  Left arm fistula is patent. 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 Discussed angioplasty and possible stent placement with patient.   Informed consent obtained.      Pami Wool RYAN 12/15/2012, 4:45 PM

## 2012-12-15 NOTE — Procedures (Signed)
Post-Procedure Note  Pre-operative Diagnosis: Poor dialysis flows       Post-operative Diagnosis: Venous stenosis   Indications:  Poor dialysis flows.  Procedure Details:   Left arm fistulogram was performed.  Venous stenosis treated with 6 mm and 7 mm balloons.    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  Stenosis resolved after balloon angioplasty.  Complications: none     Condition: good  Plan: Plan for hemodialysis tomorrow.

## 2012-12-16 ENCOUNTER — Other Ambulatory Visit: Payer: Self-pay | Admitting: Nephrology

## 2012-12-16 LAB — POTASSIUM: Potassium: 4.3 mmol/L (ref 3.5–5.1)

## 2012-12-18 ENCOUNTER — Telehealth (HOSPITAL_COMMUNITY): Payer: Self-pay | Admitting: *Deleted

## 2012-12-18 ENCOUNTER — Other Ambulatory Visit: Payer: Self-pay | Admitting: Nephrology

## 2012-12-18 LAB — POTASSIUM: Potassium: 3.9 mmol/L (ref 3.5–5.1)

## 2012-12-27 ENCOUNTER — Ambulatory Visit: Payer: Self-pay | Admitting: Hematology and Oncology

## 2013-01-17 ENCOUNTER — Other Ambulatory Visit: Payer: Self-pay | Admitting: Surgery

## 2013-01-18 LAB — CEA: CEA: 3.1 ng/mL (ref 0.0–4.7)

## 2013-04-11 ENCOUNTER — Other Ambulatory Visit: Payer: Self-pay | Admitting: *Deleted

## 2013-04-11 DIAGNOSIS — T82598A Other mechanical complication of other cardiac and vascular devices and implants, initial encounter: Secondary | ICD-10-CM

## 2013-04-19 ENCOUNTER — Encounter (HOSPITAL_COMMUNITY): Payer: Self-pay

## 2013-04-19 ENCOUNTER — Inpatient Hospital Stay (HOSPITAL_COMMUNITY)
Admission: EM | Admit: 2013-04-19 | Discharge: 2013-04-23 | DRG: 371 | Disposition: A | Discharge status: Home / Self Care | Payer: Medicare Other | Attending: Family Medicine | Admitting: Family Medicine

## 2013-04-19 ENCOUNTER — Emergency Department (HOSPITAL_COMMUNITY): Payer: Medicare Other

## 2013-04-19 DIAGNOSIS — D631 Anemia in chronic kidney disease: Secondary | ICD-10-CM | POA: Diagnosis present

## 2013-04-19 DIAGNOSIS — N186 End stage renal disease: Secondary | ICD-10-CM | POA: Diagnosis present

## 2013-04-19 DIAGNOSIS — Z79899 Other long term (current) drug therapy: Secondary | ICD-10-CM

## 2013-04-19 DIAGNOSIS — I959 Hypotension, unspecified: Secondary | ICD-10-CM

## 2013-04-19 DIAGNOSIS — Z992 Dependence on renal dialysis: Secondary | ICD-10-CM

## 2013-04-19 DIAGNOSIS — K5792 Diverticulitis of intestine, part unspecified, without perforation or abscess without bleeding: Secondary | ICD-10-CM

## 2013-04-19 DIAGNOSIS — D649 Anemia, unspecified: Secondary | ICD-10-CM

## 2013-04-19 DIAGNOSIS — Z87891 Personal history of nicotine dependence: Secondary | ICD-10-CM

## 2013-04-19 DIAGNOSIS — E039 Hypothyroidism, unspecified: Secondary | ICD-10-CM | POA: Diagnosis present

## 2013-04-19 DIAGNOSIS — J449 Chronic obstructive pulmonary disease, unspecified: Secondary | ICD-10-CM | POA: Diagnosis present

## 2013-04-19 DIAGNOSIS — I471 Supraventricular tachycardia, unspecified: Secondary | ICD-10-CM | POA: Diagnosis not present

## 2013-04-19 DIAGNOSIS — E785 Hyperlipidemia, unspecified: Secondary | ICD-10-CM | POA: Diagnosis present

## 2013-04-19 DIAGNOSIS — R7881 Bacteremia: Secondary | ICD-10-CM | POA: Diagnosis present

## 2013-04-19 DIAGNOSIS — A4901 Methicillin susceptible Staphylococcus aureus infection, unspecified site: Secondary | ICD-10-CM | POA: Diagnosis present

## 2013-04-19 DIAGNOSIS — J4489 Other specified chronic obstructive pulmonary disease: Secondary | ICD-10-CM | POA: Diagnosis present

## 2013-04-19 DIAGNOSIS — K5732 Diverticulitis of large intestine without perforation or abscess without bleeding: Secondary | ICD-10-CM | POA: Diagnosis present

## 2013-04-19 DIAGNOSIS — C189 Malignant neoplasm of colon, unspecified: Secondary | ICD-10-CM | POA: Diagnosis present

## 2013-04-19 DIAGNOSIS — I120 Hypertensive chronic kidney disease with stage 5 chronic kidney disease or end stage renal disease: Secondary | ICD-10-CM | POA: Diagnosis present

## 2013-04-19 DIAGNOSIS — A0472 Enterocolitis due to Clostridium difficile, not specified as recurrent: Principal | ICD-10-CM | POA: Diagnosis present

## 2013-04-19 DIAGNOSIS — E869 Volume depletion, unspecified: Secondary | ICD-10-CM | POA: Diagnosis present

## 2013-04-19 DIAGNOSIS — N2581 Secondary hyperparathyroidism of renal origin: Secondary | ICD-10-CM | POA: Diagnosis present

## 2013-04-19 HISTORY — DX: Hypothyroidism, unspecified: E03.9

## 2013-04-19 HISTORY — DX: Gastro-esophageal reflux disease without esophagitis: K21.9

## 2013-04-19 HISTORY — DX: Pneumonia, unspecified organism: J18.9

## 2013-04-19 HISTORY — DX: Angina pectoris, unspecified: I20.9

## 2013-04-19 HISTORY — DX: Unspecified osteoarthritis, unspecified site: M19.90

## 2013-04-19 HISTORY — DX: Shortness of breath: R06.02

## 2013-04-19 LAB — COMPREHENSIVE METABOLIC PANEL
AST: 17 U/L (ref 0–37)
Albumin: 3 g/dL — ABNORMAL LOW (ref 3.5–5.2)
Alkaline Phosphatase: 45 U/L (ref 39–117)
Chloride: 94 mEq/L — ABNORMAL LOW (ref 96–112)
Potassium: 5.2 mEq/L — ABNORMAL HIGH (ref 3.5–5.1)
Sodium: 137 mEq/L (ref 135–145)
Total Bilirubin: 0.4 mg/dL (ref 0.3–1.2)

## 2013-04-19 LAB — CREATININE, SERUM: GFR calc non Af Amer: 12 mL/min — ABNORMAL LOW (ref >90)

## 2013-04-19 LAB — CBC WITH DIFFERENTIAL/PLATELET
Basophils Absolute: 0 10*3/uL (ref 0.0–0.1)
Basophils Relative: 0 % (ref 0–1)
MCHC: 33.8 g/dL (ref 30.0–36.0)
Neutro Abs: 3.9 10*3/uL (ref 1.7–7.7)
Neutrophils Relative %: 76 % (ref 43–77)
RDW: 18.2 % — ABNORMAL HIGH (ref 11.5–15.5)

## 2013-04-19 LAB — CBC
HCT: 25.5 % — ABNORMAL LOW (ref 36.0–46.0)
Hemoglobin: 8.4 g/dL — ABNORMAL LOW (ref 12.0–15.0)
MCH: 32.1 pg (ref 26.0–34.0)
MCHC: 32.9 g/dL (ref 30.0–36.0)
MCV: 97.3 fL (ref 78.0–100.0)

## 2013-04-19 MED ORDER — METRONIDAZOLE 500 MG PO TABS: 500.0000 mg | ORAL_TABLET | Freq: Three times a day (TID) | ORAL | Status: DC

## 2013-04-19 MED ORDER — CIPROFLOXACIN IN D5W 400 MG/200ML IV SOLN
400.0000 mg | INTRAVENOUS | Status: DC
  Administered 2013-04-20: 400 mg via INTRAVENOUS
  Filled 2013-04-19 (×2): qty 200

## 2013-04-19 MED ORDER — LIDOCAINE HCL (PF) 1 % IJ SOLN: 5.0000 mL | INTRAMUSCULAR | Status: DC | PRN

## 2013-04-19 MED ORDER — HEPARIN SODIUM (PORCINE) 1000 UNIT/ML DIALYSIS
3000.0000 [IU] | INTRAMUSCULAR | Status: DC | PRN
  Filled 2013-04-19: qty 3

## 2013-04-19 MED ORDER — ALTEPLASE 2 MG IJ SOLR
2.0000 mg | Freq: Once | INTRAMUSCULAR | Status: AC | PRN
  Filled 2013-04-19: qty 2

## 2013-04-19 MED ORDER — SODIUM CHLORIDE 0.9 % IV BOLUS (SEPSIS)
500.0000 mL | Freq: Once | INTRAVENOUS | Status: AC
  Administered 2013-04-19: 500 mL via INTRAVENOUS

## 2013-04-19 MED ORDER — LEVOTHYROXINE SODIUM 125 MCG PO TABS
125.0000 ug | ORAL_TABLET | Freq: Every day | ORAL | Status: DC
  Administered 2013-04-20 – 2013-04-22 (×4): 125 ug via ORAL
  Filled 2013-04-19 (×5): qty 1

## 2013-04-19 MED ORDER — SODIUM CHLORIDE 0.9 % IJ SOLN
3.0000 mL | Freq: Two times a day (BID) | INTRAMUSCULAR | Status: DC
  Administered 2013-04-20 – 2013-04-23 (×7): 3 mL via INTRAVENOUS

## 2013-04-19 MED ORDER — SODIUM CHLORIDE 0.9 % IJ SOLN
3.0000 mL | Freq: Two times a day (BID) | INTRAMUSCULAR | Status: DC
  Administered 2013-04-22: 3 mL via INTRAVENOUS

## 2013-04-19 MED ORDER — SEVELAMER CARBONATE 800 MG PO TABS
2400.0000 mg | ORAL_TABLET | Freq: Three times a day (TID) | ORAL | Status: DC
  Administered 2013-04-20 – 2013-04-23 (×9): 2400 mg via ORAL
  Administered 2013-04-23 (×2): 800 mg via ORAL
  Filled 2013-04-19 (×13): qty 3

## 2013-04-19 MED ORDER — DARBEPOETIN ALFA-POLYSORBATE 100 MCG/0.5ML IJ SOLN
100.0000 ug | INTRAMUSCULAR | Status: DC
  Administered 2013-04-19: 100 ug via INTRAVENOUS
  Filled 2013-04-19: qty 0.5

## 2013-04-19 MED ORDER — HEPARIN SODIUM (PORCINE) 1000 UNIT/ML DIALYSIS
1000.0000 [IU] | INTRAMUSCULAR | Status: DC | PRN
  Filled 2013-04-19: qty 1

## 2013-04-19 MED ORDER — MOMETASONE FURO-FORMOTEROL FUM 100-5 MCG/ACT IN AERO
2.0000 | INHALATION_SPRAY | Freq: Two times a day (BID) | RESPIRATORY_TRACT | Status: DC
  Administered 2013-04-19 – 2013-04-23 (×7): 2 via RESPIRATORY_TRACT
  Filled 2013-04-19: qty 8.8

## 2013-04-19 MED ORDER — CIPROFLOXACIN IN D5W 400 MG/200ML IV SOLN
400.0000 mg | INTRAVENOUS | Status: DC
  Filled 2013-04-19: qty 200

## 2013-04-19 MED ORDER — CALCIUM ACETATE 667 MG PO CAPS
667.0000 mg | ORAL_CAPSULE | Freq: Three times a day (TID) | ORAL | Status: DC
Start: 2013-04-20 — End: 2013-04-23
  Administered 2013-04-20 – 2013-04-23 (×11): 667 mg via ORAL
  Filled 2013-04-19 (×13): qty 1

## 2013-04-19 MED ORDER — METRONIDAZOLE IN NACL 5-0.79 MG/ML-% IV SOLN
500.0000 mg | Freq: Three times a day (TID) | INTRAVENOUS | Status: DC
  Administered 2013-04-20 (×2): 500 mg via INTRAVENOUS
  Filled 2013-04-19 (×4): qty 100

## 2013-04-19 MED ORDER — LIDOCAINE-PRILOCAINE 2.5-2.5 % EX CREA
1.0000 | TOPICAL_CREAM | CUTANEOUS | Status: DC | PRN
Start: 2013-04-19 — End: 2013-04-21

## 2013-04-19 MED ORDER — SODIUM CHLORIDE 0.9 % IV SOLN: 100.0000 mL | INTRAVENOUS | Status: DC | PRN

## 2013-04-19 MED ORDER — IOHEXOL 300 MG/ML  SOLN: 25.0000 mL | INTRAMUSCULAR | Status: AC

## 2013-04-19 MED ORDER — SODIUM CHLORIDE 0.9 % IV SOLN: 250.0000 mL | INTRAVENOUS | Status: DC | PRN

## 2013-04-19 MED ORDER — DOXERCALCIFEROL 4 MCG/2ML IV SOLN
8.0000 ug | INTRAVENOUS | Status: DC
  Administered 2013-04-19 – 2013-04-21 (×2): 8 ug via INTRAVENOUS
  Filled 2013-04-19 (×2): qty 4

## 2013-04-19 MED ORDER — PENTAFLUOROPROP-TETRAFLUOROETH EX AERO
1.0000 | INHALATION_SPRAY | CUTANEOUS | Status: DC | PRN
Start: 2013-04-19 — End: 2013-04-21

## 2013-04-19 MED ORDER — METRONIDAZOLE IN NACL 5-0.79 MG/ML-% IV SOLN
500.0000 mg | Freq: Once | INTRAVENOUS | Status: DC
  Filled 2013-04-19: qty 100

## 2013-04-19 MED ORDER — IPRATROPIUM-ALBUTEROL 18-103 MCG/ACT IN AERO
2.0000 | INHALATION_SPRAY | Freq: Four times a day (QID) | RESPIRATORY_TRACT | Status: DC | PRN
  Filled 2013-04-19: qty 14.7

## 2013-04-19 MED ORDER — NEPHRO-VITE 0.8 MG PO TABS
1.0000 | ORAL_TABLET | Freq: Every day | ORAL | Status: DC
  Administered 2013-04-20: 1 via ORAL
  Filled 2013-04-19 (×2): qty 1

## 2013-04-19 MED ORDER — SODIUM CHLORIDE 0.9 % IJ SOLN: 3.0000 mL | INTRAMUSCULAR | Status: DC | PRN

## 2013-04-19 MED ORDER — CINACALCET HCL 30 MG PO TABS
30.0000 mg | ORAL_TABLET | Freq: Every day | ORAL | Status: DC
  Administered 2013-04-20 – 2013-04-22 (×4): 30 mg via ORAL
  Filled 2013-04-19 (×6): qty 1

## 2013-04-19 MED ORDER — DIPHENHYDRAMINE HCL 25 MG PO CAPS
25.0000 mg | ORAL_CAPSULE | Freq: Four times a day (QID) | ORAL | Status: DC | PRN
  Administered 2013-04-22: 25 mg via ORAL
  Filled 2013-04-19: qty 1

## 2013-04-19 MED ORDER — NEPRO/CARBSTEADY PO LIQD: 237.0000 mL | ORAL | Status: DC | PRN

## 2013-04-19 MED ORDER — CIPROFLOXACIN HCL 500 MG PO TABS: 500.0000 mg | ORAL_TABLET | ORAL | Status: DC

## 2013-04-19 MED ORDER — HEPARIN SODIUM (PORCINE) 5000 UNIT/ML IJ SOLN
5000.0000 [IU] | Freq: Three times a day (TID) | INTRAMUSCULAR | Status: DC
  Administered 2013-04-20 – 2013-04-23 (×12): 5000 [IU] via SUBCUTANEOUS
  Filled 2013-04-19 (×15): qty 1

## 2013-04-19 MED ORDER — CIPROFLOXACIN IN D5W 400 MG/200ML IV SOLN: 400.0000 mg | Freq: Once | INTRAVENOUS | Status: DC

## 2013-04-19 NOTE — ED Notes (Signed)
Renal MD

## 2013-04-19 NOTE — ED Notes (Signed)
Dr. Ermalinda Memos from [facility] is at the bedside.

## 2013-04-19 NOTE — ED Notes (Signed)
This RN spoke with dialysis. This RN was told to hold the pt's antibiotics and that the dialysis nurse would give the Hectorol and the Aranesp. The dialysis nurse will call back to get a full report.

## 2013-04-19 NOTE — ED Notes (Signed)
Pt presents with c/o diarrhea that began three days ago. Pt says the diarrhea started three days ago and it is green in color and watery. Pt also vomited one time on Monday of this week. Pt is a dialysis patient and received dialysis yesterday but did not finish the treatment yesterday d/t cold chills. Pt went to dialysis today but the nurse sent her here to be seen so no dialysis received today.

## 2013-04-19 NOTE — ED Provider Notes (Signed)
History     CSN: [id number]  Arrival date & time 04/19/13  4098   First MD Initiated Contact with Patient 04/19/13 [phone]      Chief Complaint  Patient presents with  . Diarrhea    (Consider location/radiation/quality/duration/timing/severity/associated sxs/prior treatment) The history is provided by the patient.  [name] is a 72 y.o. female  Hx of HTN, HL, CKD, COPD, esrd on HD (T,Th, Sat) here with diarrhea. Diarrhea for the last 3 days. It is watery in nature. No recent antibiotic use but was given IV antibiotics during dialysis 2 days ago. The last 2 days diarrhea got worse and she's had one episode of vomiting. He is due for dialysis today. She's been having some chills but no fevers and no chest pain or cough. She is worse in the left lower quadrant pain as well and has a history of colon cancer and was status post hysterectomy and colon polyp removal.    Past Medical History  Diagnosis Date  . Hypertension   . Hyperlipidemia   . Thyroid disease     Hypothyroidism  . Chronic kidney disease   . Autoimmune thrombocytopenia   . COPD (chronic obstructive pulmonary disease)   . Diverticulitis 2013 sept.      hospitalized  at [facility].    Past Surgical History  Procedure Laterality Date  . Av fistula placement, brachiocephalic  11/04/2010    right arm  . Abdominal hysterectomy    . Colonoscopy  Dec 2013    colon cancer .. surgery  to be 2014    Family History  Problem Relation Age of Onset  . Heart disease Mother   . Cancer Father   . Kidney disease Brother     History  Substance Use Topics  . Smoking status: Former Smoker    Types: Cigarettes  . Smokeless tobacco: Not on file  . Alcohol Use: No    OB History   Grav Para Term Preterm Abortions TAB SAB Ect Mult Living                  Review of Systems  Gastrointestinal: Positive for abdominal pain and diarrhea.  All other systems reviewed and are negative.    Allergies  Shellfish-derived  products  Home Medications   Current Outpatient Rx  Name  Route  Sig  Dispense  Refill  . albuterol-ipratropium (COMBIVENT) 18-103 MCG/ACT inhaler   Inhalation   Inhale 2 puffs into the lungs 2 (two) times daily as needed. For wheezing         . B Complex-C-Folic Acid (NEPHRO-VITE PO)   Oral   Take 1 tablet by mouth at bedtime.          . calcium acetate (PHOSLO) 667 MG capsule   Oral   Take 667 mg by mouth 3 (three) times daily with meals.           . cinacalcet (SENSIPAR) 30 MG tablet   Oral   Take 30 mg by mouth at bedtime.          . diphenhydrAMINE (BENADRYL) 25 mg capsule   Oral   Take 25 mg by mouth every 6 (six) hours as needed. For allergies/sleep         . Fluticasone-Salmeterol (ADVAIR) 250-50 MCG/DOSE AEPB   Inhalation   Inhale 1 puff into the lungs 2 (two) times daily as needed (for shortness of breath).         . isosorbide mononitrate (IMDUR) 60 MG  24 hr tablet   Oral   Take 60 mg by mouth at bedtime.         Marland Kitchen levothyroxine (SYNTHROID, LEVOTHROID) 125 MCG tablet   Oral   Take 125 mcg by mouth at bedtime.          . metoprolol succinate (TOPROL XL) 25 MG 24 hr tablet   Oral   Take 1 tablet (25 mg total) by mouth daily.   30 tablet   2   . sevelamer (RENVELA) 800 MG tablet   Oral   Take 2,400 mg by mouth 3 (three) times daily with meals.            BP 96/48  Pulse 67  Temp(Src) 98 F (36.7 C) (Oral)  Resp 16  Ht 5\' 4"  (1.626 m)  Wt 150 lb (68.04 kg)  BMI 25.73 kg/m2  SpO2 99%  Physical Exam  Nursing note and vitals reviewed. Constitutional: She is oriented to person, place, and time.  Chronically ill, dehydrated   HENT:  Head: Normocephalic.  MM dry   Eyes: Conjunctivae are normal. Pupils are equal, round, and reactive to light.  Neck: Normal range of motion. Neck supple.  Cardiovascular: Normal rate, regular rhythm and normal heart sounds.   Pulmonary/Chest: Effort normal and breath sounds normal. No respiratory  distress. She has no wheezes. She has no rales.  Abdominal: Soft.  LLQ tenderness, no rebound, No CVAT   Musculoskeletal: Normal range of motion.  1+ edema   Neurological: She is alert and oriented to person, place, and time. No cranial nerve deficit. Coordination normal.  Skin: Skin is warm and dry.  Psychiatric: She has a normal mood and affect. Her behavior is normal. Judgment and thought content normal.    ED Course  Procedures (including critical care time)    Labs Reviewed  CBC WITH DIFFERENTIAL - Abnormal; Notable for the following:    RBC 2.71 (*)    Hemoglobin 8.8 (*)    HCT 26.0 (*)    RDW 18.2 (*)    Platelets 122 (*)    All other components within normal limits  COMPREHENSIVE METABOLIC PANEL - Abnormal; Notable for the following:    Potassium 5.2 (*)    Chloride 94 (*)    Glucose, Bld 106 (*)    BUN 33 (*)    Creatinine, Ser 8.05 (*)    Albumin 3.0 (*)    GFR calc non Af Amer 4 (*)    GFR calc Af Amer 5 (*)    All other components within normal limits  LIPASE, BLOOD - Abnormal; Notable for the following:    Lipase 64 (*)    All other components within normal limits  CLOSTRIDIUM DIFFICILE BY PCR   Ct Abdomen Pelvis Wo Contrast  04/19/2013  *RADIOLOGY REPORT*  Clinical Data: Abdominal pain.  Diarrhea.  History of colon cancer.  CT ABDOMEN AND PELVIS WITHOUT CONTRAST  Technique:  Multidetector CT imaging of the abdomen and pelvis was performed following the standard protocol without intravenous contrast.  Comparison: No priors.  Findings:  Lung Bases: 7 mm subpleural nodule in the inferior aspect of the right lower lobe adjacent to the right hemidiaphragm (image 6 of series 3).  Mild scarring in the lung bases bilaterally (right greater than left).  Extensive calcifications of the mitral annulus and subvalvular apparatus.  Extensive atherosclerosis, including mid and distal right coronary artery calcifications.  Mild cardiomegaly.  Abdomen/Pelvis:  The noncontrast  appearance of the liver, gallbladder, pancreas  and bilateral adrenal glands is unremarkable. Calcification along the anterior margin of the spleen likely represents a granuloma.  The spleen is otherwise unremarkable in appearance.  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  Normal appendix.  Extensive atherosclerosis throughout the abdominal and pelvic vasculature, without definite aneurysm.  No significant volume of ascites.  No pneumoperitoneum. No pathologic distension of small bowel.  No definite pathologic lymphadenopathy identified within the abdomen or pelvis. Status post hysterectomy.  Ovaries are not confidently identified and may be surgically absent or atrophic.  There are numerous colonic diverticula, particularly in the region of the sigmoid colon.  In the midsigmoid colon, in the area of the greatest diverticular disease there is profound colonic wall thickening and subtle inflammatory changes in the associated mesocolon, concerning for potential acute diverticulitis.  No definite diverticular abscess or other acute complicating features are noted at this time.  Musculoskeletal: There are no aggressive appearing lytic or blastic lesions noted in the visualized portions of the skeleton.  IMPRESSION: 1.  Findings, as above, concerning for early acute diverticulitis in the sigmoid colon. 2.  Normal appendix. 3.  Extensive atherosclerosis, including right coronary artery disease. 4.  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 5.  7 mm subpleural nodule in the inferior aspect of the right lower lobe. If the patient is at high risk for bronchogenic carcinoma, follow-up chest CT at 3-6 months is recommended.  If the patient is at low risk for bronchogenic carcinoma, follow-up chest CT at 6-12 months is  recommended.  This recommendation follows the consensus statement: Guidelines for Management of Small Pulmonary Nodules Detected on CT Scans: A Statement from the Fleischner Society as published in Radiology 2005; 237:395-400.   Original Report Authenticated By: Trudie Reed, M.D.    Dg Chest 2 View  04/19/2013  *RADIOLOGY REPORT*  Clinical Data: Chills and vomiting  CHEST - 2 VIEW  Comparison:  December 14, 2012  Findings:  Areas of patchy scarring along the periphery of the right lung remains stable.  Elsewhere, lungs clear.  Heart is borderline prominent with normal pulmonary vascularity, stable.  No adenopathy.  There is extensive atherosclerotic change throughout the aorta.  No bone lesions.  IMPRESSION: System and stable scarring right lung.  No new opacity.  Extensive atherosclerotic change.   Original Report Authenticated By: Bretta Bang, M.D.      No diagnosis found.    MDM  [name] is a 72 y.o. female here with diarrhea. Need to r/o C diff vs gastro vs increasing mass. She is hypotensive initially so will give IVF slowly. Will likely need admission and dialysis inpatient.   9:30 AM I called nephrologist, Dr. Allena Katz will see patient and dialyze her. Still hypotensive after 500 cc bolus will rebolus.   12:48 PM CT showed diverticulitis, cipro, flagyl ordered. Hypotension improved. Patient admitted to family practice. Will go to dialysis soon.      Richardean Canal, MD 04/19/13 1249

## 2013-04-19 NOTE — ED Notes (Signed)
Pt to dialysis. Report given to [facility].

## 2013-04-19 NOTE — Procedures (Signed)
Patient seen on Hemodialysis. QB 400, UF goal Keeping even Treatment adjusted as needed.  Zetta Bills MD [facility]. Office # [phone] Pager # [phone] 2:32 PM

## 2013-04-19 NOTE — H&P (Signed)
Family Medicine Teaching [facility] Admission History and Physical Service Pager: [phone]  Patient name: [name] Medical record number: [id number] Date of birth: 08-25-1941 Age: 72 y.o. Gender: female  Primary Care Provider: Default, Provider, MD  Chief Complaint: Diarrhea  Assessment and Plan: [name] is a 72 y.o. year old female presenting with watery diarrhea. WIth her CT findings of early acute diverticulitis I feel that her diarrhea is most likely related to that. However with 4 days of diarrhea and recent chills we will consider an infectous source also.   Diverticulitis - Admit to Telemetry floor - Non tender on my exam, hemodynamically stable - Cipro and flagyl IV, renally dosed - Clear liquid diet, will advance if tolerates well - WBC 5.0, afebrile - monitor for pain, WBC, and tolerance of diet - fecal occult blood  Diarrhea - Likely diverticulitis related, non bloody - C diff, lactoferrin - Consider GI pathogen panel if becomes bloody, develops fever, or develops leukocytosis - monitor   HTN - low today is likely a reflection of her fluid status with severe diarrhea - hold imdur and metoprolol XL in setting of low BP.   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 Iron panel from 08/2012 showing low iron and TIBC and high ferritin consistent with anemia of chronic disease.  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 year old female presenting with  profuse watery diarrhea for 4 days. Her diarrhea is described as loose, non bloody, watery, and brown and occuring 10+ times a day since onset. She had 1 episodes of nausea and vomiting X1 (NB/NB) after dialysis on Tuesday but has not persisted since then. She has had decreased PO intake of food and fluids since Tuesday due to loss of appetite. She denies dyspnea, chest pain, dizziness, weakness, headache, and vision change. She has had some BL leg pain yesterday when she was walking down some stairs at which time she was worried she would fall which is resolved now. She states she has had some mild LLQ abd pain that has now resolved. She last took her blood pressure meds last night.   She lives at home with her husband and daughter and states that she can normally walk without problem.   Patient Active Problem List  Diagnosis  . Anemia  . ESRD (end stage renal disease) on dialysis  . HTN (hypertension)  . COPD (chronic obstructive pulmonary disease)  . Hypothyroidism  . Nausea and vomiting   Past Medical History: Past Medical History  Diagnosis Date  . Hypertension   . Hyperlipidemia   . Thyroid disease     Hypothyroidism  . Chronic kidney disease   . Autoimmune thrombocytopenia   . COPD (chronic obstructive pulmonary disease)   . Diverticulitis 2013 sept.      hospitalized  at [facility].   Past Surgical History: Past Surgical History  Procedure Laterality Date  . Av fistula placement, brachiocephalic  11/04/2010    right arm  . Abdominal hysterectomy    . Colonoscopy  Dec 2013  colon cancer .. surgery  to be 2014: records of colonoscopy not found in chart.    Social History: History  Substance Use Topics  . Smoking status: Former Smoker    Types: Cigarettes  . Smokeless tobacco: Not on file  . Alcohol Use: No   For any additional social history documentation, please refer to relevant sections of EMR.  Family History: Family History  Problem Relation Age of Onset  .  Heart disease Mother   . Cancer Father   . Kidney disease Brother    Allergies: Allergies  Allergen Reactions  . Shellfish-Derived Products Anaphylaxis   No current facility-administered medications on file prior to encounter.   Current Outpatient Prescriptions on File Prior to Encounter  Medication Sig Dispense Refill  . albuterol-ipratropium (COMBIVENT) 18-103 MCG/ACT inhaler Inhale 2 puffs into the lungs 2 (two) times daily as needed. For wheezing      . B Complex-C-Folic Acid (NEPHRO-VITE PO) Take 1 tablet by mouth at bedtime.       . calcium acetate (PHOSLO) 667 MG capsule Take 667 mg by mouth 3 (three) times daily with meals.        . cinacalcet (SENSIPAR) 30 MG tablet Take 30 mg by mouth at bedtime.       . diphenhydrAMINE (BENADRYL) 25 mg capsule Take 25 mg by mouth every 6 (six) hours as needed. For allergies/sleep      . isosorbide mononitrate (IMDUR) 60 MG 24 hr tablet Take 60 mg by mouth at bedtime.      Marland Kitchen levothyroxine (SYNTHROID, LEVOTHROID) 125 MCG tablet Take 125 mcg by mouth at bedtime.       . metoprolol succinate (TOPROL XL) 25 MG 24 hr tablet Take 1 tablet (25 mg total) by mouth daily.  30 tablet  2  . sevelamer (RENVELA) 800 MG tablet Take 2,400 mg by mouth 3 (three) times daily with meals.        Review Of Systems: Per HPI   Physical Exam: BP 96/48  Pulse 67  Temp(Src) 98 F (36.7 C) (Oral)  Resp 16  Ht 5\' 4"  (1.626 m)  Wt 150 lb (68.04 kg)  BMI 25.73 kg/m2  SpO2 99% Exam: Physical Exam  Gen: NAD, alert, cooperative with exam HEENT:  EOMI, PERRL, dry mucous membranes CV: RRR, good S1/S2, 2-3/6 systolic murmur Resp: CTABL, no wheezes, non-labored Abd: SNTND, BS present, no guarding or organomegaly, non tender to deep palpation, well healed midline scar on lower abdomen Ext: No edema, warm, 2+ dp pulses Neuro: Alert and oriented, strength 5/5 and sensation intact in all 4 extremities Skin: No rash apparent on abdomen, or BL arms, decreased skin  turgor   Labs and Imaging: CBC BMET   Recent Labs Lab 04/19/13 0805  WBC 5.0  HGB 8.8*  HCT 26.0*  PLT 122*    Recent Labs Lab 04/19/13 0805  NA 137  K 5.2*  CL 94*  CO2 26  BUN 33*  CREATININE 8.05*  GLUCOSE 106*  CALCIUM 10.1      Recent Labs Lab 04/19/13 0805  AST 17  ALT <5  ALKPHOS 45  BILITOT 0.4  PROT 6.9  ALBUMIN 3.0*    CT Abd/pelvis without 04/19/2013 IMPRESSION:  1. Findings, as above, concerning for early acute diverticulitis  in the sigmoid colon.  2. Normal appendix.  3. Extensive atherosclerosis, including right coronary artery  disease.  4. 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  5. 7 mm subpleural nodule  in the inferior aspect of the right  lower lobe. If the patient is at high risk for bronchogenic  carcinoma, follow-up chest CT at 3-6 months is recommended. If the  patient is at low risk for bronchogenic carcinoma, follow-up chest  CT at 6-12 months is recommended. This recommendation follows the  consensus statement: Guidelines for Management of Small Pulmonary  Nodules Detected on CT Scans: A Statement from the Fleischner  Society as published in Radiology 2005; 237:395-400.  CXR 2 view 04/19/2013 IMPRESSION:  System and stable scarring right lung. No new opacity. Extensive  atherosclerotic change.   Kevin Fenton, MD 04/19/2013, 12:50 PM  Patient seen, examined. Available data reviewed. Agree with findings, assessment, and plan as outlined by Dr. Ermalinda Memos.  My additional findings are documented and highlighted above in blue.   Marena Chancy, PGY-2 Family Medicine Resident

## 2013-04-19 NOTE — ED Notes (Signed)
Patient transported to X-ray 

## 2013-04-19 NOTE — Consult Note (Signed)
Reason for Consult: Continuity of ESRD care Referring Physician: Dr. Silverio Lay- Emergency room physician   HPI:  72 year old African American woman past medical history significant for ESRD on hemodialysis at the [facility] on a Tuesday, Thursday and Saturday schedule. Presented to the emergency room today for further evaluation of diarrhea x2 days, nausea and vomiting x3-4 days and chills for the past 24 or so hours. She states that she missed dialysis on Tuesday made up for it by going to dialysis yesterday however was unable to complete her full treatment due to shivering and tremors from chills. She reports transient left lower quadrant abdominal pain that has resolved today. She reports a better ability of tolerating oral intake this morning. Denies any fevers. Denies any hematochezia/melena/hematemesis. Denies any sick contacts. Denies any associated respiratory symptoms. Has not noticed any icterus. Empiric blood cultures were drawn yesterday on hemodialysis due to her chills.  Outpatient dialysis prescription: TTS-3 hours and 45 minutes, Optiflux 180. QB 450/QD 800, 3K/2.25 calcium, left upper arm arteriovenous fistula. Estimated dry weight 75 kg.  Past Medical History  Diagnosis Date  . Hypertension   . Hyperlipidemia   . Thyroid disease     Hypothyroidism  . Chronic kidney disease   . Autoimmune thrombocytopenia   . COPD (chronic obstructive pulmonary disease)   . Diverticulitis 2013 sept.      hospitalized  at [facility].    Past Surgical History  Procedure Laterality Date  . Av fistula placement, brachiocephalic  11/04/2010    right arm  . Abdominal hysterectomy    . Colonoscopy  Dec 2013    colon cancer .. surgery  to be 2014    Family History  Problem Relation Age of Onset  . Heart disease Mother   . Cancer Father   . Kidney disease Brother     Social History:  reports that she has quit smoking. Her smoking use included Cigarettes. She smoked 0.00 packs per  day. She does not have any smokeless tobacco history on file. She reports that she does not drink alcohol or use illicit drugs.  Allergies:  Allergies  Allergen Reactions  . Shellfish-Derived Products Anaphylaxis    Medications:     Results for orders placed during the hospital encounter of 04/19/13 (from the past 48 hour(s))  CBC WITH DIFFERENTIAL     Status: Abnormal   Collection Time    04/19/13  8:05 AM      Result Value Range   WBC 5.0  4.0 - 10.5 K/uL   RBC 2.71 (*) 3.87 - 5.11 MIL/uL   Hemoglobin 8.8 (*) 12.0 - 15.0 g/dL   HCT 47.8 (*) 29.5 - 62.1 %   MCV 95.9  78.0 - 100.0 fL   MCH 32.5  26.0 - 34.0 pg   MCHC 33.8  30.0 - 36.0 g/dL   RDW 30.8 (*) 65.7 - 84.6 %   Platelets 122 (*) 150 - 400 K/uL   Neutrophils Relative 76  43 - 77 %   Neutro Abs 3.9  1.7 - 7.7 K/uL   Lymphocytes Relative 14  12 - 46 %   Lymphs Abs 0.7  0.7 - 4.0 K/uL   Monocytes Relative 9  3 - 12 %   Monocytes Absolute 0.5  0.1 - 1.0 K/uL   Eosinophils Relative 0  0 - 5 %   Eosinophils Absolute 0.0  0.0 - 0.7 K/uL   Basophils Relative 0  0 - 1 %   Basophils Absolute 0.0  0.0 - 0.1 K/uL  COMPREHENSIVE METABOLIC PANEL     Status: Abnormal   Collection Time    04/19/13  8:05 AM      Result Value Range   Sodium 137  135 - 145 mEq/L   Potassium 5.2 (*) 3.5 - 5.1 mEq/L   Chloride 94 (*) 96 - 112 mEq/L   CO2 26  19 - 32 mEq/L   Glucose, Bld 106 (*) 70 - 99 mg/dL   BUN 33 (*) 6 - 23 mg/dL   Creatinine, Ser 1.61 (*) 0.50 - 1.10 mg/dL   Calcium 09.6  8.4 - 04.5 mg/dL   Total Protein 6.9  6.0 - 8.3 g/dL   Albumin 3.0 (*) 3.5 - 5.2 g/dL   AST 17  0 - 37 U/L   ALT <5  0 - 35 U/L   Alkaline Phosphatase 45  39 - 117 U/L   Total Bilirubin 0.4  0.3 - 1.2 mg/dL   GFR calc non Af Amer 4 (*) >90 mL/min   GFR calc Af Amer 5 (*) >90 mL/min   Comment:            The eGFR has been calculated     using the CKD EPI equation.     This calculation has not been     validated in all clinical     situations.      eGFR's persistently     <90 mL/min signify     possible Chronic Kidney Disease.  LIPASE, BLOOD     Status: Abnormal   Collection Time    04/19/13  8:05 AM      Result Value Range   Lipase 64 (*) 11 - 59 U/L    Dg Chest 2 View  04/19/2013  *RADIOLOGY REPORT*  Clinical Data: Chills and vomiting  CHEST - 2 VIEW  Comparison:  December 14, 2012  Findings:  Areas of patchy scarring along the periphery of the right lung remains stable.  Elsewhere, lungs clear.  Heart is borderline prominent with normal pulmonary vascularity, stable.  No adenopathy.  There is extensive atherosclerotic change throughout the aorta.  No bone lesions.  IMPRESSION: System and stable scarring right lung.  No new opacity.  Extensive atherosclerotic change.   Original Report Authenticated By: Bretta Bang, M.D.     Review of Systems  Constitutional: Positive for chills and malaise/fatigue. Negative for fever, weight loss and diaphoresis.  HENT: Negative.   Eyes: Negative.   Respiratory: Positive for cough. Negative for hemoptysis, sputum production, shortness of breath and wheezing.        Chronic intermittent dry cough  Cardiovascular: Negative.   Gastrointestinal: Positive for nausea, vomiting, abdominal pain and diarrhea. Negative for heartburn, constipation, blood in stool and melena.       LLQ pain for 2 days---currently resolved  Genitourinary: Negative.   Musculoskeletal: Negative.   Skin: Negative.   Neurological: Positive for weakness. Negative for dizziness, tingling, tremors, sensory change, speech change and focal weakness.  Endo/Heme/Allergies: Negative.   Psychiatric/Behavioral: Negative.   All other systems reviewed and are negative.   Blood pressure 99/50, pulse 73, temperature 97.5 F (36.4 C), temperature source Oral, resp. rate 16, height 5\' 4"  (1.626 m), weight 68.04 kg (150 lb), SpO2 99.00%. Physical Exam  Nursing note and vitals reviewed. Constitutional: She is oriented to person, place,  and time. She appears well-developed and well-nourished. No distress.  HENT:  Head: Normocephalic and atraumatic.  Mouth/Throat: No oropharyngeal exudate.  Dry oropharynx  Eyes: Conjunctivae and EOM are normal. Pupils are equal, round, and reactive to light. Right eye exhibits no discharge. Left eye exhibits no discharge. No scleral icterus.  Neck: Neck supple. No JVD present. No tracheal deviation present. No thyromegaly present.  Cardiovascular: Normal rate and regular rhythm.  Exam reveals no gallop and no friction rub.   No murmur heard. Respiratory: Effort normal and breath sounds normal. No stridor. No respiratory distress. She has no wheezes. She has no rales. She exhibits no tenderness.  GI: Soft. Bowel sounds are normal. She exhibits no distension and no mass. There is no tenderness. There is no rebound and no guarding.  Musculoskeletal: Normal range of motion. She exhibits tenderness. She exhibits no edema.  LUA AVF with excellent thrill and bruit (Intact scabs from cannulation yesterday)  Lymphadenopathy:    She has no cervical adenopathy.  Neurological: She is alert and oriented to person, place, and time. No cranial nerve deficit. Coordination normal.  Skin: Skin is warm and dry. No rash noted. She is not diaphoretic. No erythema. No pallor.  Psychiatric: She has a normal mood and affect. Her behavior is normal.    Assessment/Plan: 1. Nausea, vomiting, diarrhea: Symptoms of are described appear to be consistent with probable viral gastroenteritis. CT scan of the abdomen/pelvis requested and pending. She appears to be volume depleted on account of input/output mismatch accounting for lower weight and estimated dry weight as well as hypotension noted on admission to the emergency room. Status post gentle IV fluids. 2. End-stage renal disease on hemodialysis: Will do hemodialysis today to get her back on schedule as well as help give some clearance. 3. Hypotension: Hold  antihypertensive therapy, agree with gentle intravenous fluids-we'll not do any ultrafiltration at hemodialysis. 4. Anemia of chronic kidney disease: Low hemoglobin, resume ESA. Followup on iron. 5. Metabolic bone disease: Restart phosphorus binders when she is satisfactorily take in oral intake. Reconcile vitamin D receptor analogs dose.  Zaydon Kinser K. 04/19/2013, 11:16 AM

## 2013-04-19 NOTE — ED Notes (Signed)
Pt undressed, in gown, on continuous pulse oximetry and blood pressure cuff; family at bedside 

## 2013-04-19 NOTE — ED Notes (Signed)
IV attempted x 2; IV team paged  

## 2013-04-19 NOTE — ED Notes (Addendum)
This RN spoke with dialysis to confirm time of treatment. This RN was told that they had just received orders and will call when ready for the pt.

## 2013-04-19 NOTE — Progress Notes (Signed)
Pt venous access not working; pt has a buttonhole but had to use a sharp needle after increased venous pressures; after cannulating the second time the venous pressures continued to rise into the lower 300s. Stopped tx with left. Called and notified Dr. Primitivo Gauze.

## 2013-04-20 ENCOUNTER — Encounter (HOSPITAL_COMMUNITY): Payer: Self-pay | Admitting: *Deleted

## 2013-04-20 DIAGNOSIS — I959 Hypotension, unspecified: Secondary | ICD-10-CM

## 2013-04-20 DIAGNOSIS — K5732 Diverticulitis of large intestine without perforation or abscess without bleeding: Secondary | ICD-10-CM

## 2013-04-20 DIAGNOSIS — N186 End stage renal disease: Secondary | ICD-10-CM

## 2013-04-20 DIAGNOSIS — D649 Anemia, unspecified: Secondary | ICD-10-CM

## 2013-04-20 LAB — BASIC METABOLIC PANEL
BUN: 15 mg/dL (ref 6–23)
CO2: 28 mEq/L (ref 19–32)
Chloride: 95 mEq/L — ABNORMAL LOW (ref 96–112)
GFR calc Af Amer: 9 mL/min — ABNORMAL LOW (ref >90)
Glucose, Bld: 83 mg/dL (ref 70–99)
Potassium: 4 mEq/L (ref 3.5–5.1)

## 2013-04-20 LAB — CBC
HCT: 24.5 % — ABNORMAL LOW (ref 36.0–46.0)
Hemoglobin: 8.2 g/dL — ABNORMAL LOW (ref 12.0–15.0)
MCHC: 33.5 g/dL (ref 30.0–36.0)

## 2013-04-20 LAB — MRSA PCR SCREENING: MRSA by PCR: NEGATIVE

## 2013-04-20 MED ORDER — METRONIDAZOLE 500 MG PO TABS
500.0000 mg | ORAL_TABLET | Freq: Three times a day (TID) | ORAL | Status: DC
  Administered 2013-04-20 – 2013-04-23 (×9): 500 mg via ORAL
  Filled 2013-04-20 (×12): qty 1

## 2013-04-20 MED ORDER — RENA-VITE PO TABS
1.0000 | ORAL_TABLET | Freq: Every day | ORAL | Status: DC
  Administered 2013-04-20 – 2013-04-23 (×4): 1 via ORAL
  Filled 2013-04-20 (×4): qty 1

## 2013-04-20 MED ORDER — CIPROFLOXACIN HCL 500 MG PO TABS
500.0000 mg | ORAL_TABLET | ORAL | Status: DC
  Filled 2013-04-20: qty 1

## 2013-04-20 MED ORDER — CALCIUM CARBONATE ANTACID 500 MG PO CHEW
2.0000 | CHEWABLE_TABLET | Freq: Three times a day (TID) | ORAL | Status: DC | PRN
Start: 2013-04-20 — End: 2013-04-23
  Administered 2013-04-20 – 2013-04-22 (×3): 400 mg via ORAL
  Filled 2013-04-20 (×3): qty 2

## 2013-04-20 NOTE — Progress Notes (Signed)
I have seen and examined this patient. I have discussed with Dr Thekkekandam.  I agree with their findings and plans as documented in their progress note.    

## 2013-04-20 NOTE — H&P (Signed)
I have seen and examined this patient. I have discussed with Dr Gwenlyn Saran.  I agree with their findings and plans as documented in their admission note.  Acute Issues 1.  Acute, uncomplicated sigmoid diverticulitis - primary manifestation of non-bloody, painless diarrhea - Will look for evidence of C. Diff colitis given hemodialysis-related immunocompromised state.   Plan; Agree with Cipro/flagyl.  Repeat CT abdomen and pelvis if fails to improve or patient's condition deteriorates. Consult Nephrology for HD

## 2013-04-20 NOTE — Progress Notes (Signed)
Utilization Review Completed.   Teauna Dubach, RN, BSN Nurse Case Manager  [phone]  

## 2013-04-20 NOTE — Progress Notes (Signed)
Lab reported to me positive C Diff results. MD notified.

## 2013-04-20 NOTE — Progress Notes (Signed)
Pt had a four beat run of VT at 8:38 this am. Pt asymptomatic, resting comfortably in bed. Pt has had other intermittent occasions of PVC pairs. Pt also complaining of indigestion. MD notified.

## 2013-04-20 NOTE — Progress Notes (Signed)
FMTS Daily Intern Progress Note  Subjective: [name] denies dizziness, shortness of breath this morning. Reports mild chest pain that resolved with water, she thinks because she ate nothing. Wonders if she can go home today.  I have reviewed the patient's medications. No PRN in 24 hours.  Objective Temp:  [97.3 F (36.3 C)-99.2 F (37.3 C)] 99.2 F (37.3 C) (04/25 0514) Pulse Rate:  [66-100] 94 (04/25 0514) Resp:  [16-18] 18 (04/25 0514) BP: (90-126)/(43-77) 117/49 mmHg (04/25 0514) SpO2:  [93 %-99 %] 99 % (04/25 0514) Weight:  [168 lb 3.4 oz (76.3 kg)] 168 lb 3.4 oz (76.3 kg) (04/24 2113)   Intake/Output Summary (Last 24 hours) at 04/20/13 0940 Last data filed at 04/20/13 0515  Gross per 24 hour  Intake      0 ml  Output    -75 ml  Net     75 ml    General: NAD, lying in bed, pleasant HEENT: AT/[place], sclera clear, eomi CV: ?Irregular rhythm vs skipped beat every 5-6 beats Pulm: CTAB with no wheezes or crackles, normal effort Abd: Soft, nontender, nondistended, obese, hyperactive bowel sounds Ext: No LE edema, no cyanosis Neuro: Alert, no focal deficits, normal speech  Labs and Imaging  Recent Labs Lab 04/19/13 0805 04/19/13 1916 04/20/13 0635  WBC 5.0 6.3 6.1  HGB 8.8* 8.4* 8.2*  HCT 26.0* 25.5* 24.5*  PLT 122* 113* 142*     Recent Labs Lab 04/19/13 0805 04/19/13 1916 04/20/13 0635  NA 137  --  137  K 5.2*  --  4.0  CL 94*  --  95*  CO2 26  --  28  BUN 33*  --  15  CREATININE 8.05* 3.57* 4.80*  GLUCOSE 106*  --  83  CALCIUM 10.1  --  9.6     Assessment and Plan - [name] is a 72 y.o. year old female presenting with watery diarrhea. WIth her CT findings of early acute diverticulitis, this is most likely etiology of diarrhea. However with 4 days of diarrhea and recent chills we will consider an infectous source also.   Diverticulitis  - Continues to be nontender with no fever and normal WBC count  - Continue cipro and flagyl IV, started 4/24  and renally dosed  - Clear liquid diet, will advance today if tolerates well   - monitor for pain, WBC, and tolerance of diet  - fecal occult blood once stools  Diarrhea  - Likely diverticulitis related, non bloody  - C diff, lactoferrin once stools - Consider GI pathogen panel if becomes bloody, develops fever, or develops leukocytosis  - monitor - no further episodes here yet  HTN  - low over 24 hours is likely a reflection of her fluid status with severe diarrhea ; currently 117/49 - continue to hold imdur and metoprolol XL in setting of low BP.   Anemia, normocytic  - Chronic, likely anemia of chronic renal disease. Iron panel from 08/2012 showing low iron and TIBC and high ferritin consistent with anemia of chronic disease.  - Hgb 8.2, baseline appears to be 9-10  - Monitor   ESRD- t/th/sa dialysis  - nephrology consulted -appreciate reccs - dialyzed 4/24 - continue sensipar for secondary hyperparathyroid and sevlamer for hyperphosphatemia  - monitor   COPD  - at baseline  - continue dulera and PRN albuterol   Hypothyroidism  - Continue home synthroid   FEN/GI: Clears, saline lock IV  Prophylaxis: Sub q heparin  Disposition: tele,  home when clinically improved, possibly today Code Status: Full for now, discuss  [name] MD Family Practice PGY-1 Pager: [phone] 04/20/2013, 9:40 AM

## 2013-04-20 NOTE — Progress Notes (Signed)
Patient ID: [name], female   DOB: 1941/08/03, 72 y.o.   MRN: [id number]   [facility] Progress Note    Subjective:   Reports to be feeling fair-still has some right lower quadrant pain and diarrhea with stool incontinence earlier this morning. Denies hematochezia    Objective:   BP 117/49  Pulse 94  Temp(Src) 99.2 F (37.3 C) (Oral)  Resp 18  Ht 5\' 4"  (1.626 m)  Wt 76.3 kg (168 lb 3.4 oz)  BMI 28.86 kg/m2  SpO2 99%  Physical Exam: Gen: Comfortably resting in bed, watching television CVS: Pulse regular in rate and rhythm, heart sounds S1 and S2 normal Resp: Clear to auscultation bilaterally, no rales/rhonchi Abd: Soft, obese, mildly tender over the right lower quadrant. Old exploratory laparoscopy scar Ext: No lower extremity edema  Labs: BMET  Recent Labs Lab 04/19/13 0805 04/19/13 1916 04/20/13 0635  NA 137  --  137  K 5.2*  --  4.0  CL 94*  --  95*  CO2 26  --  28  GLUCOSE 106*  --  83  BUN 33*  --  15  CREATININE 8.05* 3.57* 4.80*  CALCIUM 10.1  --  9.6   CBC  Recent Labs Lab 04/19/13 0805 04/19/13 1916 04/20/13 0635  WBC 5.0 6.3 6.1  NEUTROABS 3.9  --   --   HGB 8.8* 8.4* 8.2*  HCT 26.0* 25.5* 24.5*  MCV 95.9 97.3 96.5  PLT 122* 113* 142*    Medications:    . calcium acetate  667 mg Oral TID WC  . cinacalcet  30 mg Oral QHS  . ciprofloxacin  400 mg Intravenous Q24H  . darbepoetin (ARANESP) injection - DIALYSIS  100 mcg Intravenous Q Thu-HD  . doxercalciferol  8 mcg Intravenous Q T,Th,Sa-HD  . heparin  5,000 Units Subcutaneous Q8H  . levothyroxine  125 mcg Oral QHS  . metronidazole  500 mg Intravenous Q8H  . mometasone-formoterol  2 puff Inhalation BID  . multivitamin  1 tablet Oral Daily  . sevelamer carbonate  2,400 mg Oral TID WC  . sodium chloride  3 mL Intravenous Q12H  . sodium chloride  3 mL Intravenous Q12H     Assessment/ Plan:    1. Nausea, vomiting, diarrhea/Abdominal Pain: CT scan of the abdomen/pelvis  shows possible early sigmoid diverticulitis-studies pending to evaluate for C. difficile colitis. Informed earlier today that a recent colonoscopy done revealed that she had a malignant appearing colonic polyp that would require to be addressed in the near future 2. End-stage renal disease-plan for hemodialysis tomorrow per usual schedule-limited ultrafiltration at hemodialysis.  3. Hypotension: Secondary to volume depletion from GI losses, improved         4. Anemia of chronic kidney disease: Low hemoglobin, resume ESA. Followup on iron.  5. Metabolic bone disease: Restart phosphorus binders when she is satisfactorily take in oral intake. Reconcile vitamin D receptor analogs dose.   Zetta Bills, MD 04/20/2013, 9:52 AM

## 2013-04-21 LAB — RENAL FUNCTION PANEL
CO2: 28 mEq/L (ref 19–32)
Chloride: 95 mEq/L — ABNORMAL LOW (ref 96–112)
Creatinine, Ser: 7.1 mg/dL — ABNORMAL HIGH (ref 0.50–1.10)
GFR calc Af Amer: 6 mL/min — ABNORMAL LOW (ref >90)
GFR calc non Af Amer: 5 mL/min — ABNORMAL LOW (ref >90)
Potassium: 4.2 mEq/L (ref 3.5–5.1)

## 2013-04-21 LAB — PREPARE RBC (CROSSMATCH)

## 2013-04-21 LAB — CBC
Hemoglobin: 6.6 g/dL — CL (ref 12.0–15.0)
MCH: 32.4 pg (ref 26.0–34.0)
RBC: 2.04 MIL/uL — ABNORMAL LOW (ref 3.87–5.11)
WBC: 4.9 10*3/uL (ref 4.0–10.5)

## 2013-04-21 MED ORDER — HEPARIN SODIUM (PORCINE) 1000 UNIT/ML DIALYSIS
3000.0000 [IU] | INTRAMUSCULAR | Status: DC | PRN
  Filled 2013-04-21: qty 3

## 2013-04-21 MED ORDER — CEFAZOLIN SODIUM 1-5 GM-% IV SOLN
1.0000 g | Freq: Once | INTRAVENOUS | Status: AC
  Administered 2013-04-21: 1 g via INTRAVENOUS
  Filled 2013-04-21: qty 50

## 2013-04-21 MED ORDER — DOXERCALCIFEROL 4 MCG/2ML IV SOLN
INTRAVENOUS | Status: AC
  Filled 2013-04-21: qty 4

## 2013-04-21 NOTE — Progress Notes (Signed)
Patient ID: [name], female   DOB: 09-Feb-1941, 72 y.o.   MRN: [id number]   [facility] Progress Note    Subjective:   Reports to be feeling fair and diarrhea is improving- occasional abdominal discomfort   Objective:   BP 128/63  Pulse 82  Temp(Src) 98.2 F (36.8 C) (Oral)  Resp 18  Ht 5\' 4"  (1.626 m)  Wt 75.6 kg (166 lb 10.7 oz)  BMI 28.59 kg/m2  SpO2 94%  Physical Exam: QVZ:DGLOVFIEPPI resting on HD RJJ:OACZY RRR, normal S1 and S2  Resp:CTA bilaterally, no rales/rhonchi SAY:TKZS, obese, mildly tender over LLQ Ext:No LE edema  Labs: BMET  Recent Labs Lab 04/19/13 0805 04/19/13 1916 04/20/13 0635 04/21/13 0751  NA 137  --  137 135  K 5.2*  --  4.0 4.2  CL 94*  --  95* 95*  CO2 26  --  28 28  GLUCOSE 106*  --  83 102*  BUN 33*  --  15 24*  CREATININE 8.05* 3.57* 4.80* 7.10*  CALCIUM 10.1  --  9.6 9.9  PHOS  --   --   --  4.9*   CBC  Recent Labs Lab 04/19/13 0805 04/19/13 1916 04/20/13 0635 04/21/13 0751  WBC 5.0 6.3 6.1 4.9  NEUTROABS 3.9  --   --   --   HGB 8.8* 8.4* 8.2* 6.6*  HCT 26.0* 25.5* 24.5* 19.3*  MCV 95.9 97.3 96.5 94.6  PLT 122* 113* 142* 147*    Medications:    . calcium acetate  667 mg Oral TID WC  . cinacalcet  30 mg Oral QHS  . darbepoetin (ARANESP) injection - DIALYSIS  100 mcg Intravenous Q Thu-HD  . doxercalciferol  8 mcg Intravenous Q T,Th,Sa-HD  . heparin  5,000 Units Subcutaneous Q8H  . levothyroxine  125 mcg Oral QHS  . metroNIDAZOLE  500 mg Oral Q8H  . mometasone-formoterol  2 puff Inhalation BID  . multivitamin  1 tablet Oral Daily  . sevelamer carbonate  2,400 mg Oral TID WC  . sodium chloride  3 mL Intravenous Q12H  . sodium chloride  3 mL Intravenous Q12H     Assessment/ Plan:   1. Nausea, vomiting, diarrhea/Abdominal Pain: CT scan of the abdomen/pelvis shows possible early sigmoid diverticulitis-studies confirm C. difficile colitis and she has been started on PO flagyl. Recent colonoscopy  (complicated by bowel perforation and need for emergent Exp Lap) revealed that she had a malignant appearing colonic polyp that would require to be resected in the near future  2. End-stage renal disease-plan for hemodialysis today per usual schedule-limited ultrafiltration at hemodialysis.  3. Hypotension: Secondary to volume depletion from GI losses, improved  4. Anemia of chronic kidney disease: Lower hemoglobin--planned for 2 units PRBCs at HD today, resume ESA. Followup on iron.  5. Metabolic bone disease: Restart phosphorus binders when she is satisfactorily take in oral intake. Started on Hectorol.   Zetta Bills, MD 04/21/2013, 8:42 AM

## 2013-04-21 NOTE — Procedures (Signed)
Patient seen on Hemodialysis. QB 400, UF goal , BP 124/62 Treatment adjusted as needed.  Zetta Bills MD [facility]. Office # [phone] Pager # [phone] 8:56 AM

## 2013-04-21 NOTE — Progress Notes (Signed)
CRITICAL VALUE ALERT  Critical value received: Hgb 6.6   Date of notification: 04-21-2013   Time of notification: 0816  Critical value read back:yes  Nurse who received alert:  Kandis Ban, RN  MD notified (1st page): Gerome Apley, PA-C  Time of first page:  0818  MD notified (2nd page):  Time of second page:  Responding MD: Gerome Apley, PA-C  Time MD responded:  [phone]

## 2013-04-21 NOTE — Progress Notes (Signed)
FMTS Daily Intern Progress Note  Subjective: Ms Minner reported small episode of diarrhea overnight with no blood. Mild abdominal pain but tolerated full liquid diet yesterday. Complained of heart burn around 1pm yesterday and got PRN tums, which helped.  I have reviewed the patient's medications. PRN tums 130PM yesterday.  Telemetry: Run of 4 beat v tach yesterday with no discomfort  Objective Temp:  [98.1 F (36.7 C)-99.2 F (37.3 C)] 98.2 F (36.8 C) (04/26 0735) Pulse Rate:  [76-100] 85 (04/26 1000) Resp:  [16-20] 20 (04/26 1000) BP: (117-132)/(6-72) 121/59 mmHg (04/26 1000) SpO2:  [92 %-97 %] 94 % (04/26 0735) Weight:  [166 lb 10.7 oz (75.6 kg)] 166 lb 10.7 oz (75.6 kg) (04/26 0735)   Intake/Output Summary (Last 24 hours) at 04/21/13 1027 Last data filed at 04/21/13 0600  Gross per 24 hour  Intake    480 ml  Output      0 ml  Net    480 ml    General: NAD, lying in bed, pleasant HEENT: AT/[place], sclera clear, eomi CV: RRR, no murmurs, rubs, gallops Pulm: CTAB with no wheezes or crackles, normal effort Abd: Soft, mildly tender lower abdomen, nondistended, obese, hyperactive bowel sounds Ext: No LE edema, no cyanosis Neuro: Alert, no focal deficits, normal speech  Labs and Imaging  Recent Labs Lab 04/19/13 1916 04/20/13 0635 04/21/13 0751  WBC 6.3 6.1 4.9  HGB 8.4* 8.2* 6.6*  HCT 25.5* 24.5* 19.3*  PLT 113* 142* 147*     Recent Labs Lab 04/19/13 0805 04/19/13 1916 04/20/13 0635 04/21/13 0751  NA 137  --  137 135  K 5.2*  --  4.0 4.2  CL 94*  --  95* 95*  CO2 26  --  28 28  BUN 33*  --  15 24*  CREATININE 8.05* 3.57* 4.80* 7.10*  GLUCOSE 106*  --  83 102*  CALCIUM 10.1  --  9.6 9.9   C difficile positive FOBT negative  Assessment and Plan - 72 y.o. year old female who presented with 4 days of watery diarrhea. CT findings of early acute diverticulitis and c difficile colitis are most likely etiologies of her diarrhea.   Diverticulitis with diarrhea -  one episode since here - Only mildly tender with no fever and normal WBC count; FOBT neg and c difficile positive - With c difficile positive stool, discontinued cipro and continued IV flagyl started 4/24 and renally dosed  - Clear liquid diet, will advance today if tolerates well   - monitor for pain, WBC, and tolerance of diet  - H/o colonoscopy with perforation requiring emergent partial colectomy; f/u with GI outpatient for possible future resection of malignant-appearing polyps  HTN  - low over 24 hours is likely a reflection of her fluid status with severe diarrhea  - continue to hold imdur and metoprolol XL in setting of low BP.   Anemia, normocytic  - Chronic, likely anemia of chronic renal disease. Iron panel from 08/2012 showing low iron and TIBC and high ferritin consistent with anemia of chronic disease.  - Hgb today dropped to 6.6, baseline appears to be 9-10 - receiving 2 units pRBC at transfusion today; uncertain etiology of acute drop - Monitor Hgb and f/u Fe  ESRD- t/th/sa dialysis  - nephrology consulted -appreciate reccs - dialyzed 4/24 and again 4/26 - continue sensipar for secondary hyperparathyroid and sevlamer for hyperphosphatemia  - monitor   COPD  - at baseline  - continue dulera and PRN albuterol  Hypothyroidism  - Continue home synthroid   FEN/GI: Full liquid, saline lock IV  Prophylaxis: Sub q heparin  Disposition: tele, home when clinically improved, hoped for today but not with this morning's hemoglobin drop and transfusion - F/u CT abdomen in 3-6 months to further evaluate abnormalities noted in this admission CT - F/u with GI re: malignant-appearing polyps Code Status: Full for now, discuss  [name] MD Family Practice PGY-1 Pager: [phone] 04/21/2013, 10:27 AM

## 2013-04-21 NOTE — Progress Notes (Signed)
04/21/13 1535 nsg Central monitoring sent a message for vtach,  strip was printed and showed more of an SVT (seen by 3RN's including 2000 CN) . pt had no complaints when RN went to check on her but after 30 minutes RN went to check on her again c/o chest pain more of an acid reflux as  claimed  Further says she had same complaint yesterday and was relieved with TUms. Prn med given and MD on call notified no new orders but to continue to monitor.

## 2013-04-21 NOTE — Progress Notes (Signed)
I have seen and examined this patient. I have discussed with Dr Thekkekandam.  I agree with their findings and plans as documented in their progress note.    

## 2013-04-22 ENCOUNTER — Inpatient Hospital Stay (HOSPITAL_COMMUNITY): Payer: Medicare Other

## 2013-04-22 DIAGNOSIS — A0472 Enterocolitis due to Clostridium difficile, not specified as recurrent: Secondary | ICD-10-CM | POA: Diagnosis present

## 2013-04-22 DIAGNOSIS — R7881 Bacteremia: Secondary | ICD-10-CM | POA: Diagnosis present

## 2013-04-22 DIAGNOSIS — K5732 Diverticulitis of large intestine without perforation or abscess without bleeding: Secondary | ICD-10-CM | POA: Diagnosis present

## 2013-04-22 DIAGNOSIS — I471 Supraventricular tachycardia: Secondary | ICD-10-CM

## 2013-04-22 LAB — CBC
HCT: 27.8 % — ABNORMAL LOW (ref 36.0–46.0)
MCV: 90 fL (ref 78.0–100.0)
Platelets: 179 10*3/uL (ref 150–400)
RBC: 3.09 MIL/uL — ABNORMAL LOW (ref 3.87–5.11)
WBC: 5.9 10*3/uL (ref 4.0–10.5)

## 2013-04-22 LAB — TYPE AND SCREEN
ABO/RH(D): AB POS
Antibody Screen: NEGATIVE
Unit division: 0

## 2013-04-22 LAB — BASIC METABOLIC PANEL
Calcium: 11.2 mg/dL — ABNORMAL HIGH (ref 8.4–10.5)
Chloride: 94 mEq/L — ABNORMAL LOW (ref 96–112)
Creatinine, Ser: 5.03 mg/dL — ABNORMAL HIGH (ref 0.50–1.10)
GFR calc Af Amer: 9 mL/min — ABNORMAL LOW (ref >90)

## 2013-04-22 LAB — TROPONIN I: Troponin I: <0.3 ng/mL (ref <0.30)

## 2013-04-22 MED ORDER — CEFAZOLIN SODIUM-DEXTROSE 2-3 GM-% IV SOLR: 2.0000 g | INTRAVENOUS | Status: DC

## 2013-04-22 MED ORDER — METOPROLOL SUCCINATE ER 25 MG PO TB24
25.0000 mg | ORAL_TABLET | Freq: Every day | ORAL | Status: DC
  Administered 2013-04-22 – 2013-04-23 (×2): 25 mg via ORAL
  Filled 2013-04-22 (×2): qty 1

## 2013-04-22 NOTE — Progress Notes (Signed)
FMTS Daily Intern Progress Note: pager: [phone]  Subjective:  Patient had 2 episodes of SVT one yesterday and one this morning lasting 2 minutes in the 180's. During this time, patient was asymptomatic without chest pain, shortness of breath or overall discomfort. After the episode, she started having right sided chest pain which she states is similar to her previous episodes of heart burn.  Otherwise, she had 2 more formed stools yesterday. One this morning.   I have reviewed the patient's medications.  Objective Temp:  [98 F (36.7 C)-99.5 F (37.5 C)] 98 F (36.7 C) (04/27 0929) Pulse Rate:  [81-92] 81 (04/27 0929) Resp:  [18-20] 18 (04/27 0929) BP: (107-148)/(59-71) 107/65 mmHg (04/27 0929) SpO2:  [91 %-100 %] 92 % (04/27 0929)   Intake/Output Summary (Last 24 hours) at 04/22/13 1229 Last data filed at 04/22/13 0900  Gross per 24 hour  Intake    650 ml  Output      0 ml  Net    650 ml    CBG (last 3)  No results found for this basename: GLUCAP,  in the last 72 hours  General: alert and oriented x4, no acute distress HEENT: moist mucous membranes, PERRLA CV: S1S2, RRR, no murmur Pulm: CTA b/l Abd: soft, non tender, non distended Ext: no edema Neuro: no focal deficits.   Labs and Imaging  Recent Labs Lab 04/19/13 1916 04/20/13 0635 04/21/13 0751 04/21/13 1540  WBC 6.3 6.1 4.9  --   HGB 8.4* 8.2* 6.6* 9.8*  HCT 25.5* 24.5* 19.3* 28.5*  PLT 113* 142* 147*  --      Recent Labs Lab 04/19/13 0805 04/19/13 1916 04/20/13 0635 04/21/13 0751  NA 137  --  137 135  K 5.2*  --  4.0 4.2  CL 94*  --  95* 95*  CO2 26  --  28 28  BUN 33*  --  15 24*  CREATININE 8.05* 3.57* 4.80* 7.10*  GLUCOSE 106*  --  83 102*  CALCIUM 10.1  --  9.6 9.9    Assessment and Plan - 72 y.o. year old female who presented with 4 days of watery diarrhea. CT findings of early acute diverticulitis and c difficile colitis are most likely etiologies of her diarrhea.   Diverticulitis  with diarrhea, c-diff positive - improving.   - With c difficile positive stool, discontinued cipro and continued IV flagyl started 4/24 and renally dosed  - tolerating diet - H/o colonoscopy with perforation requiring emergent partial colectomy; f/u with GI outpatient for possible future resection of malignant-appearing polyps   Episode of SVT: 2 episodes, asymptomatic.  - EKG normal - troponin mildly elevated at 0.35, likely elevated in setting of renal disease.  - restarted beta blocker this am. - monitor - chest pain likely not cardiac since located on right side.  - if recurrent, consult cardiology  HTN  - hypotension improved but still in the low 100's.  - start back metoprolol, hold imdur  Anemia, normocytic  - Chronic, likely anemia of chronic renal disease. Iron panel from 08/2012 showing low iron and TIBC and high ferritin consistent with anemia of chronic disease.  - drop in Hg of 6.6. Now 9.8 s/p transfusion.  - follow up am CBC  MSSA positive blood culture from outside facility: likely from AV fistula.  - on ancef with dialysis.   ESRD- t/th/sa dialysis  - nephrology consulted -appreciate reccs - dialyzed 4/24 and again 4/26  - continue sensipar for secondary  hyperparathyroid and sevlamer for hyperphosphatemia  - monitor  COPD  - at baseline  - continue dulera and PRN albuterol  Hypothyroidism  - Continue home synthroid  FEN/GI: Full liquid, saline lock IV  Prophylaxis: Sub q heparin  Disposition: tele, probably not home today, in setting of recurrent SVT.  - F/u CT abdomen in 3-6 months to further evaluate abnormalities noted in this admission CT  - F/u with GI re: malignant-appearing polyps  Code Status: Full for now, discuss   Marena Chancy Pager: [phone] 04/22/2013, 12:29 PM

## 2013-04-22 NOTE — Progress Notes (Signed)
I have seen and examined this patient. I have discussed with Dr Gwenlyn Saran.  I agree with their findings and plans as documented in their progress note.  1. Paroxysmal Superaventricular Tachycardia, non-sustained - Recurrent - sharp, brief right precordial chest pain  - Tele: regular Narrow Complex, acute onset and offset, HR ~150 BPM, uniform QRS - EKG (12 lead): NSR, Normal EKG - Echocardiogram Sept 2013 with diastolic LV dysfunction, normal Ejection fraction, Pulmonary hypertension, calcified aortic valves - Hemodynamically stable.  - troponin mildly elevated at 0.35 in ESRD  - Restarted home Toprol XL 25 mg daily around 1000 hrs today.  -  Basic Metabolic Panel:    Component Value Date/Time   NA 135 04/21/2013 0751   K 4.2 04/21/2013 0751   CL 95* 04/21/2013 0751   CO2 28 04/21/2013 0751   BUN 24* 04/21/2013 0751   CREATININE 7.10* 04/21/2013 0751   GLUCOSE 102* 04/21/2013 0751   CALCIUM 9.9 04/21/2013 0751   - Plan: - Recheck K+, increase Toprol dose if continues to recur.    2. C. Diff Colitis/sigmoid diverticulitis - Clinically improving. - Continue Flagyl/cipro oral for total of 10 days. - pt complaining about not wanting regular diet.  Wants to go back to full clear.  If tolerate, then advance to mechanical soft diet (pt is edentulous)  Disposition - Anticipate discharge soon from hospital - Consult PT to assess gait, balance and strength given acute deconditioning from acute illness.

## 2013-04-22 NOTE — Progress Notes (Signed)
Patient ID: [name], female   DOB: Jan 21, 1941, 72 y.o.   MRN: [id number]   [facility] Progress Note    Subjective:   Reports that her stools are now forming-diarrhea nearly resolved. Had an episode of supraventricular tachycardia this morning apparently asymptomatic at the time. Patient currently reports right sided chest pain.    Objective:   BP 107/65  Pulse 81  Temp(Src) 98 F (36.7 C) (Oral)  Resp 18  Ht 5\' 4"  (1.626 m)  Wt 74.6 kg (164 lb 7.4 oz)  BMI 28.22 kg/m2  SpO2 92%  Physical Exam: Gen: Comfortably resting in recliner watching television CVS: Pulse regular in rate and rhythm, heart sounds S1-S2 normal Resp: Clear to auscultation bilaterally, no rales/rhonchi Abd: Soft, obese, no tenderness, bowel sounds normal Ext: No lower extremity edema  Labs: BMET  Recent Labs Lab 04/19/13 0805 04/19/13 1916 04/20/13 0635 04/21/13 0751  NA 137  --  137 135  K 5.2*  --  4.0 4.2  CL 94*  --  95* 95*  CO2 26  --  28 28  GLUCOSE 106*  --  83 102*  BUN 33*  --  15 24*  CREATININE 8.05* 3.57* 4.80* 7.10*  CALCIUM 10.1  --  9.6 9.9  PHOS  --   --   --  4.9*   CBC  Recent Labs Lab 04/19/13 0805 04/19/13 1916 04/20/13 0635 04/21/13 0751 04/21/13 1540  WBC 5.0 6.3 6.1 4.9  --   NEUTROABS 3.9  --   --   --   --   HGB 8.8* 8.4* 8.2* 6.6* 9.8*  HCT 26.0* 25.5* 24.5* 19.3* 28.5*  MCV 95.9 97.3 96.5 94.6  --   PLT 122* 113* 142* 147*  --    Medications:    . calcium acetate  667 mg Oral TID WC  . cinacalcet  30 mg Oral QHS  . darbepoetin (ARANESP) injection - DIALYSIS  100 mcg Intravenous Q Thu-HD  . doxercalciferol  8 mcg Intravenous Q T,Th,Sa-HD  . heparin  5,000 Units Subcutaneous Q8H  . levothyroxine  125 mcg Oral QHS  . metoprolol succinate  25 mg Oral Daily  . metroNIDAZOLE  500 mg Oral Q8H  . mometasone-formoterol  2 puff Inhalation BID  . multivitamin  1 tablet Oral Daily  . sevelamer carbonate  2,400 mg Oral TID WC  . sodium  chloride  3 mL Intravenous Q12H  . sodium chloride  3 mL Intravenous Q12H   Assessment/ Plan:   1. 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 Recent colonoscopy (complicated by bowel perforation and need for emergent Exp Lap) revealed that she had a malignant appearing colonic polyp that would require to be resected in the near future. Continue oral metronidazole.  2. End-stage renal disease-plan for hemodialysis on Tuesday to resume her usual schedule. No acute indications noted for hemodialysis today.  3. Hypotension: Secondary to volume depletion from GI losses, improved  4. Anemia of chronic kidney disease: Resumed ESA S/P packed red cell transfusion.  5. Metabolic bone disease: Restarted phosphorus binders (PhosLo) and Hectorol. 6. MSSA bacteremia: Continue cefazolin for the next 2 weeks-suspect that the source of entry might have been buttonhole sites over arteriovenous fistula. Will need to exercise stringent sterile technique.    Zetta Bills, MD 04/22/2013, 10:15 AM

## 2013-04-22 NOTE — Progress Notes (Signed)
04/22/13  1245 nsg. Critical troponin 0.35 called by Lab at same time pt's HR up to 170"s v/s checked 158/95,82% in RA, 99.1.   placed 2Lper [place]. MD notified.02 at 2L 98%.

## 2013-04-22 NOTE — Progress Notes (Signed)
Reviewed alert from Central tele that pt HR=140-190, SVT. Assessed pt, states she has no complaints of pain or SOB just states she is tired because she did not rest well last night. Episode lasted for approx then pt reverted back to NSR(HR=80-90s). Notified Dr Gwenlyn Saran, no orders received. Pt resting comfortably at this time will continue to monitor

## 2013-04-23 LAB — CBC
HCT: 27.4 % — ABNORMAL LOW (ref 36.0–46.0)
Platelets: 230 10*3/uL (ref 150–400)
RDW: 17.7 % — ABNORMAL HIGH (ref 11.5–15.5)
WBC: 7.1 10*3/uL (ref 4.0–10.5)

## 2013-04-23 MED ORDER — METOPROLOL SUCCINATE ER 25 MG PO TB24: 25.0000 mg | ORAL_TABLET | Freq: Every day | ORAL | Status: DC

## 2013-04-23 MED ORDER — RENA-VITE PO TABS: 1.0000 | ORAL_TABLET | Freq: Every day | ORAL | Status: DC

## 2013-04-23 MED ORDER — METRONIDAZOLE 500 MG PO TABS: 500.0000 mg | ORAL_TABLET | Freq: Three times a day (TID) | ORAL | Status: DC

## 2013-04-23 MED ORDER — CEFAZOLIN SODIUM-DEXTROSE 2-3 GM-% IV SOLR: 2.0000 g | INTRAVENOUS | Status: DC

## 2013-04-23 NOTE — Progress Notes (Signed)
Patient Saturations on Room Air at Rest = 93%  Patient Saturations on Room Air while Ambulating = 91%   

## 2013-04-23 NOTE — Progress Notes (Signed)
[facility] Progress Note  Subjective:   Got a little tired while walking with PT in the hall. No SOB. No more stools which she attributes to being back on a liquid diet. Was served macaroni and beef with a scoop of green beans on top. "Couldn't eat that mess" Looks pretty good, has not heard from primary team what d/c plan is but does say could go home today Objective Filed Vitals:   04/22/13 2050 04/23/13 0456 04/23/13 0804 04/23/13 0815  BP:  149/65  126/79  Pulse: 87 81  76  Temp:  99.7 F (37.6 C)  98.3 F (36.8 C)  TempSrc:  Oral  Oral  Resp: 20 20  20   Height:      Weight:      SpO2: 98% 95% 99% 99%   Physical Exam  Tmax 99.7 General: Looks good. Talkative Heart: RRR Lungs: no wheezes or rales Abdomen: soft Extremities: tr LE edema Dialysis Access:  Left upper AVF with buttonholes (no erythema)+ bruit  Outpatient dialysis prescription: TTS-3 hours and 45 minutes, Optiflux 180. QB 450/QD 800, 3K/2.25 calcium, left upper arm arteriovenous fistula. Estimated dry weight 75 kg.  Assessment/Plan: 1. 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 Recent colonoscopy (complicated by bowel perforation and need for emergent Exp Lap) revealed that she had a malignant appearing colonic polyp that would require to be resected in the near future. Continue oral metronidazole.  2. End-stage renal disease-TTS - orders written for tomorrow. But if was discharged later today- as long as not too late, could transition back to home unit tomorrow 3. Hypotension: Secondary to volume depletion from GI losses, resolved - will get standing weights and orthstatics pre and post HD Tuesday. 4. Anemia of chronic kidney disease: Aranesp 100 S/P packed red cell transfusion /26  5. Metabolic bone disease: Restarted phosphorus binders (PhosLo) and Hectorol - hypercalcemia yesterday; use 2.25 Ca bath because likely  will require 4 K bath; d/c hectorol for now and re-evaluate. P controlled with phoslo 1 ac and 3 renvela 6. MSSA bacteremia: Continue cefazolin for the next 2 weeks-suspect that the source of entry might have been buttonhole sites over arteriovenous fistula. Will need to exercise stringent sterile technique - order surveillance cultures post antibiotic tx. 7. PSVT - recurrent Toprol XL resumed - already got today's dose; change to HS dosing Tuesday due to being on dialysis. AM dosing interferes with dialysis. 8. Hypothyroidism - on synthroid  Sheffield Slider, PA-C [facility] Beeper [phone] 04/23/2013,10:25 AM  LOS: 4 days   Patient seen and examined, agree with above note with above modifications. Issues appear to be resolved.  We are OK with discharge, would just want to know before end of day so can inform OP unit for her treatment tomorrow.  Has orders for tomorrow if stays.   Annie Sable, MD 04/23/2013      Additional Objective Labs: Basic Metabolic Panel:  Recent Labs Lab 04/20/13 0635 04/21/13 0751 04/22/13 1551  NA 137 135 134*  K 4.0 4.2 3.5  CL 95* 95* 94*  CO2 28 28 31   GLUCOSE 83 102* 110*  BUN 15 24* 12  CREATININE 4.80* 7.10* 5.03*  CALCIUM 9.6 9.9 11.2*  PHOS  --  4.9*  --    Liver Function Tests:  Recent Labs Lab 04/19/13 0805 04/21/13 0751  AST 17  --   ALT <5  --   ALKPHOS 45  --   BILITOT 0.4  --  PROT 6.9  --   ALBUMIN 3.0* 2.5*    Recent Labs Lab 04/19/13 0805  LIPASE 64*   CBC:  Recent Labs Lab 04/19/13 0805 04/19/13 1916 04/20/13 0635 04/21/13 0751 04/21/13 1540 04/22/13 1551  WBC 5.0 6.3 6.1 4.9  --  5.9  NEUTROABS 3.9  --   --   --   --   --   HGB 8.8* 8.4* 8.2* 6.6* 9.8* 9.5*  HCT 26.0* 25.5* 24.5* 19.3* 28.5* 27.8*  MCV 95.9 97.3 96.5 94.6  --  90.0  PLT 122* 113* 142* 147*  --  179   Blood Culture    Component Value Date/Time   SDES STOOL 04/20/2013 1236   SPECREQUEST NONE 04/20/2013 1236    REPTSTATUS 04/21/2013 FINAL 04/20/2013 1236    Cardiac Enzymes:  Recent Labs Lab 04/22/13 1123 04/22/13 1559 04/22/13 2149  TROPONINI 0.35* <0.30 <0.30  Studies/Results: Dg Chest Port 1 View  04/22/2013  *RADIOLOGY REPORT*  Clinical Data: Hypoxemia.  PORTABLE CHEST - 1 VIEW  Comparison: Multiple priors, most recently 04/19/2013.  Findings: Lung volumes are slightly low.  There is interstitial prominence throughout the lungs bilaterally, particularly in the right lung.  This asymmetry is similar to prior examinations. Multiple areas of pleural thickening and pleural calcification are again noted in the right hemithorax (unchanged).  No definite acute consolidative air space disease.  No pleural effusions.  Heart size is mildly enlarged (unchanged). The patient is rotated to the right on today's exam, resulting in distortion of the mediastinal contours and reduced diagnostic sensitivity and specificity for mediastinal pathology.  Atherosclerosis in the thoracic aorta.  IMPRESSION: 1.  Allowing for slight differences in patient positioning, the radiographic appearance of the chest is essentially unchanged, as above, without evidence of acute cardiopulmonary disease.   Original Report Authenticated By: Trudie Reed, M.D.    Medications:   . calcium acetate  667 mg Oral TID WC  . [START ON 04/24/2013]  ceFAZolin (ANCEF) IV  2 g Intravenous Q T,Th,Sa-HD  . cinacalcet  30 mg Oral QHS  . darbepoetin (ARANESP) injection - DIALYSIS  100 mcg Intravenous Q Thu-HD  . doxercalciferol  8 mcg Intravenous Q T,Th,Sa-HD  . heparin  5,000 Units Subcutaneous Q8H  . levothyroxine  125 mcg Oral QHS  . metoprolol succinate  25 mg Oral Daily  . metroNIDAZOLE  500 mg Oral Q8H  . mometasone-formoterol  2 puff Inhalation BID  . multivitamin  1 tablet Oral Daily  . sevelamer carbonate  2,400 mg Oral TID WC  . sodium chloride  3 mL Intravenous Q12H  . sodium chloride  3 mL Intravenous Q12H

## 2013-04-23 NOTE — Progress Notes (Signed)
FMTS Attending Note Patient seen and examined by me today, discussed with Dr Benjamin Stain and I agree with her assessment and plan in today's note.  Per Occupational therapy, there is no further need for their services.   Paula Compton, MD

## 2013-04-23 NOTE — Progress Notes (Signed)
FMTS Daily Intern Progress Note: pager: [phone]  Subjective:  Saturday night had runs of SVT and at a separate time had oxygen desaturations to 80s%. None since. Complains of mild right-sided burning chest pain that relieves with Tums.    I have reviewed the patient's medications. PRN tums and benadryl over 24 hours.  Objective Temp:  [98.3 F (36.8 C)-99.7 F (37.6 C)] 98.3 F (36.8 C) (04/28 0815) Pulse Rate:  [76-92] 76 (04/28 0815) Resp:  [20-22] 20 (04/28 0815) BP: (126-149)/(63-79) 126/79 mmHg (04/28 0815) SpO2:  [95 %-99 %] 99 % (04/28 0815) Weight:  [170 lb 13.7 oz (77.5 kg)] 170 lb 13.7 oz (77.5 kg) (04/27 2018)   Intake/Output Summary (Last 24 hours) at 04/23/13 1431 Last data filed at 04/23/13 0800  Gross per 24 hour  Intake    120 ml  Output      0 ml  Net    120 ml    General: alert and oriented x4, no acute distress HEENT: moist mucous membranes CV: S1S2, RRR, no murmur Pulm: CTA b/l; fine crackles bilateral lower lobes Abd: soft, non tender, non distended, NABS Ext: no edema or tenderness Neuro: no focal deficits.   Labs and Imaging  Recent Labs Lab 04/21/13 0751 04/21/13 1540 04/22/13 1551 04/23/13 1025  WBC 4.9  --  5.9 7.1  HGB 6.6* 9.8* 9.5* 9.4*  HCT 19.3* 28.5* 27.8* 27.4*  PLT 147*  --  179 230     Recent Labs Lab 04/20/13 0635 04/21/13 0751 04/22/13 1551  NA 137 135 134*  K 4.0 4.2 3.5  CL 95* 95* 94*  CO2 28 28 31   BUN 15 24* 12  CREATININE 4.80* 7.10* 5.03*  GLUCOSE 83 102* 110*  CALCIUM 9.6 9.9 11.2*     Recent Labs Lab 04/22/13 1123 04/22/13 1559 04/22/13 2149  TROPONINI 0.35* <0.30 <0.30   C diff positive FOBT negative   Assessment and Plan - 72 y.o. year old female who presented with 4 days of watery diarrhea. CT findings of early acute diverticulitis and c difficile colitis are most likely etiologies of her diarrhea.   Diverticulitis with diarrhea, c-diff positive - improving.   - With c difficile  positive stool, discontinued cipro and continued IV flagyl started 4/24 and renally dosed, now on PO flagyl; dose x 10-14 d, with day 1 being when no other abx on board (will need continue for 10-14 days after last dose ancef) - tolerating full liquid diet - H/o colonoscopy with perforation requiring emergent partial colectomy; f/u with GI outpatient for possible future resection of malignant-appearing polyps   Episode of SVT: 2 episodes, asymptomatic.  - EKG normal - troponin mildly elevated at 0.35, likely elevated in setting of renal disease --> resolved and neg x 2 - restarted beta blocker 4/27 - monitor - chest pain likely not cardiac since located on right side and resolves with tums - if recurrent, consult cardiology  HTN  - hypotension improved - start back metoprolol - Restart imdur if persistently elevated  Anemia, normocytic  - Chronic, likely anemia of chronic renal disease. Iron panel from 08/2012 showing low iron and TIBC and high ferritin consistent with anemia of chronic disease.  - drop in Hg of 6.6. 9.8 s/p transfusion. Now 9.5 - follow up am CBC  MSSA positive blood culture from outside facility: likely from AV fistula.  - on ancef with dialysis. Recommend 2 weeks.  ESRD- t/th/sa dialysis  - nephrology consulted -appreciate reccs - dialyzed  4/24 and again 4/26  - continue sensipar for secondary hyperparathyroid and sevlamer for hyperphosphatemia  - monitor   COPD  - at baseline  - continue dulera and PRN albuterol   Hypothyroidism  - Continue home synthroid   FEN/GI: Full liquid, saline lock IV  Prophylaxis: Sub q heparin  Disposition: Consult PT to evaluate Gait/balance in setting of deconditioning with acute illness; possibly home today if PT sees and if patient tolerating PO - F/u CT abdomen in 3-6 months to further evaluate abnormalities noted in this admission CT  - F/u with GI re: malignant-appearing polyps  Code Status: Full for now,  discuss   [name] PGY-1 Pager: [phone] 04/23/2013, 2:31 PM

## 2013-04-23 NOTE — Progress Notes (Signed)
Discussed discharge instructions and medications with pt. Pt showed no barriers to discharge. IV removed. Tele removed. Pt discharged to home with family. Assessment unchanged from morning.  

## 2013-04-23 NOTE — Progress Notes (Signed)
Physical Therapy Evaluation Patient Details Name: [name] MRN: [id number] DOB: 14-Aug-1941 Today's Date: 04/23/2013 Time: 4098-1191 PT Time Calculation (min): 33 min  PT Assessment / Plan / Recommendation Clinical Impression  72 yo female admitted with diarrhea and concern for C. Diff; Presents to PT overall moving well, and will use her RW prn at home; Spoke with [facility] Telemetry Monitoring to watch her HR closely while we ambulated, and we were not called with any concerns re: HR response to activity/amb; OK for dc home from PT perspective    PT Assessment  Patent does not need any further PT services    Follow Up Recommendations  No PT follow up    Does the patient have the potential to tolerate intense rehabilitation      Barriers to Discharge        Equipment Recommendations  None recommended by PT (Pretty well-equipped)    Recommendations for Other Services     Frequency      Precautions / Restrictions Precautions Precautions: None   Pertinent Vitals/Pain no apparent distress VSS      Mobility  Bed Mobility Details for Bed Mobility Assistance: Pt presented to PT sitting OOB in recliner Transfers Transfers: Sit to Stand;Stand to Sit Sit to Stand: 5: Supervision;6: Modified independent (Device/Increase time) Stand to Sit: 6: Modified independent (Device/Increase time) Details for Transfer Assistance: Smooth transitions; Has been independently getting up and down in room Ambulation/Gait Ambulation/Gait Assistance: 5: Supervision;6: Modified independent (Device/Increase time) Ambulation Distance (Feet): 300 Feet Assistive device: Rolling walker Ambulation/Gait Assistance Details: Initiated amb without device, however pt is much steadier with bil UE support from RW; Cues to self-monitor for activity tolerance; Overall did quite well, and states she will self-monitor for balance and activity tol at home and use her RW if necessary Gait Pattern: Step-through  pattern Gait velocity: WFL    Exercises     PT Diagnosis:    PT Problem List:   PT Treatment Interventions:     PT Goals    Visit Information  Last PT Received On: 04/23/13 Assistance Needed: +1    Subjective Data  Subjective: Agreeable to amb; Feels much better; Is very Independent Patient Stated Goal: home   Prior Functioning  Home Living Lives With: Family;Daughter Available Help at Discharge: Family;Available PRN/intermittently Type of Home: House Home Access: Stairs to enter [employer] of Steps:  (Pt is quite confident in her ability to get into her house) Home Layout: One level Bathroom Shower/Tub: Nurse, adult Accessibility: Yes Home Adaptive Equipment: Environmental consultant - rolling;Straight cane Prior Function Level of Independence: Independent with assistive device(s) Driving: Yes Vocation: Retired Musician: No difficulties    Copywriter, advertising Arousal/Alertness: Awake/alert Behavior During Therapy: WFL for tasks assessed/performed Overall Cognitive Status: Within Functional Limits for tasks assessed    Extremity/Trunk Assessment Right Upper Extremity Assessment RUE ROM/Strength/Tone: Within functional levels Left Upper Extremity Assessment LUE ROM/Strength/Tone: Within functional levels Right Lower Extremity Assessment RLE ROM/Strength/Tone: WFL for tasks assessed Left Lower Extremity Assessment LLE ROM/Strength/Tone: WFL for tasks assessed Trunk Assessment Trunk Assessment: Normal   Balance    End of Session PT - End of Session Equipment Utilized During Treatment: Gait belt Activity Tolerance: Patient tolerated treatment well Patient left: in chair;with call bell/phone within reach (Nephrology PA in room) Nurse Communication: Mobility status;Other (comment) (O2 sats during session)  GP     Van Clines [facility] [place], [place] [phone]  04/23/2013, 1:08 PM

## 2013-04-24 NOTE — Discharge Summary (Signed)
Discharge Summary 04/26/2013 9:03 AM  [name] DOB: 03-Oct-1941 MRN: [id number]  Date of Admission: 04/19/2013 Date of Discharge: 04/23/2013  PCP: Default, Provider, MD Consultants: Renal for continued hemodialysis,   Reason for Admission: 4 days profuse watery diarrhea  Discharge Diagnosis Primary 1. Diverticulitis Secondary 1. C difficile colitis 2. Episode of SVT 3. HTN 4. Anemia, normocytic 5. MSSA positive blood culture from outside facility 6. ESRD  7. COPD 8. Hypothyroidism   Hospital Course: 72 y.o. year old female who presented with 4 days of watery diarrhea. CT findings of early acute diverticulitis and c difficile colitis are most likely etiologies of her diarrhea.   Diverticulitis - Patient presented with diarrhea and was found to have c difficile infection as well as CT scan showing early acute diverticulitis. Started patient on ciprofloxacin and metronidazole IV, renally dosed. Once c difficile positive resulted, discontinued ciprofloxacin and eventually transitioned to PO flagyl.  Patient was discharged on PO flagyl x 10-14 days after last dose of ancef (prescribed for MSSA-positive blood culture from outside facility). By discharge, patient was tolerating diet and had no concerns. F/u with GI for this PRN recurrence of symptoms. Gave pt information about various PCPs where she can choose to follow-up.  C difficile colitis: Continued flagyl with diagnosis of c difficile infection. Patient was discharged on PO flagyl x 10-14 days AFTER last dose of ancef (prescribed for MSSA-positive blood culture from outside facility).   Episode of SVT: During hospitalization, pt had 2 episodes of asymptomatic SVT. EKG was WNL. Troponin was mildly elevated to 0.35 in setting of renal disease, and subsequently was negative x 2. Restarted beta blocker 4/27 and monitored. Patient had no further episodes. Had some burning chest pain relieved with tums that was likely GI related.   HTN:  On admission pt had episodes of hypotension, likely due to GI losses and reduced intake. After fluid administration and tolerating PO, pressures normalized and restarted home metoprolol. Continued holding imdur pending follow-up.  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 Iron panel from 08/2012 showed low iron and TIBC and high ferritin consistent with anemia of chronic disease. Drop in Hg to 6.6 during hospitalization, so patient got transfusion and improved to 9.8 s/p transfusion. Stable at 9.4 on discharge.  MSSA positive blood culture from outside facility: likely from AV fistula. Initiated ancef and recommend continuing it for 2 weeks with hemodialysis.   ESRD: Patient is on hemodialysis Tuesday-Thursday-Saturday. Consulted nephrology and continued hemodialysis on schedule. Continued senispar for secondary hyperparathyroidism and sevelemer for hyperphosphatemia.   COPD: At baseline. Continued dulera and PRN albuterol.  Hypothyroidism: Continued home synthroid with no issues.   Discharge day physical exam: Filed Vitals:   04/23/13 1800  BP: 132/82  Pulse: 81  Temp: 98.6 F (37 C)  Resp: 20  General: alert and oriented x4, no acute distress  HEENT: moist mucous membranes  CV: S1S2, RRR, no murmur  Pulm: CTA b/l; fine crackles bilateral lower lobes  Abd: soft, non tender, non distended, NABS  Ext: no edema or tenderness  Neuro: no focal deficits.    Procedures: Hemodialysis  Discharge Medications   Medication List    STOP taking these medications       isosorbide mononitrate 60 MG 24 hr tablet  Commonly known as:  IMDUR      TAKE these medications       calcium acetate 667 MG capsule  Commonly known as:  PHOSLO  Take 667 mg by mouth 3 (  three) times daily with meals.     ceFAZolin 2-3 GM-% Solr  Commonly known as:  ANCEF  Inject 50 mLs (2 g total) into the vein Every Tuesday,Thursday,and Saturday with dialysis. To be given at dialysis      cinacalcet 30 MG tablet  Commonly known as:  SENSIPAR  Take 30 mg by mouth at bedtime.     COMBIVENT 18-103 MCG/ACT inhaler  Generic drug:  albuterol-ipratropium  Inhale 2 puffs into the lungs 2 (two) times daily as needed. For wheezing     diphenhydrAMINE 25 mg capsule  Commonly known as:  BENADRYL  Take 25 mg by mouth every 6 (six) hours as needed. For allergies/sleep     Fluticasone-Salmeterol 250-50 MCG/DOSE Aepb  Commonly known as:  ADVAIR  Inhale 1 puff into the lungs 2 (two) times daily as needed (for shortness of breath).     levothyroxine 125 MCG tablet  Commonly known as:  SYNTHROID, LEVOTHROID  Take 125 mcg by mouth at bedtime.     metoprolol succinate 25 MG 24 hr tablet  Commonly known as:  TOPROL XL  Take 1 tablet (25 mg total) by mouth daily.     metroNIDAZOLE 500 MG tablet  Commonly known as:  FLAGYL  Take 1 tablet (500 mg total) by mouth 3 (three) times daily.     NEPHRO-VITE PO  Take 1 tablet by mouth at bedtime.     sevelamer carbonate 800 MG tablet  Commonly known as:  RENVELA  Take 2,400 mg by mouth 3 (three) times daily with meals.        Pertinent Hospital Labs Recent Labs  Lab  04/21/13 [phone]  04/21/13 1540  04/22/13 1551  04/23/13 1025   WBC  4.9  --  5.9  7.1   HGB  6.6*  9.8*  9.5*  9.4*   HCT  19.3*  28.5*  27.8*  27.4*   PLT  147*  --  179  230     Recent Labs  Lab  04/20/13 0635  04/21/13 0751  04/22/13 1551   NA  137  135  134*   K  4.0  4.2  3.5   CL  95*  95*  94*   CO2  28  28  31    BUN  15  24*  12   CREATININE  4.80*  7.10*  5.03*   GLUCOSE  83  102*  110*   CALCIUM  9.6  9.9  11.2*     Recent Labs  Lab  04/22/13 1123  04/22/13 1559  04/22/13 2149   TROPONINI  0.35*  <0.30  <0.30    C diff positive  FOBT negative   4/24 CT Abdomen/pelvis: IMPRESSION:  1. Findings, as above, concerning for early acute diverticulitis  in the sigmoid colon.  2. Normal appendix.  3. Extensive atherosclerosis, including  right coronary artery  disease.  4. 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  5. 7 mm subpleural nodule in the inferior aspect of the right  lower lobe. If the patient is at high risk for bronchogenic  carcinoma, follow-up chest CT at 3-6 months is recommended. If the  patient is at low risk for bronchogenic carcinoma, follow-up chest  CT at 6-12 months is recommended. This recommendation follows the  consensus statement: Guidelines for Management of Small Pulmonary  Nodules Detected on CT Scans: A Statement from the Fleischner  Society as published in Radiology 2005; 237:395-400.  Original  Report Authenticated By: Trudie Reed, M.D.  4/24 chest x-ray 2-view: IMPRESSION:  System and stable scarring right lung. No new opacity. Extensive  atherosclerotic change.  4/27 Chest xray portable: IMPRESSION:  1. Allowing for slight differences in patient positioning, the  radiographic appearance of the chest is essentially unchanged, as  above, without evidence of acute cardiopulmonary disease.   Discharge instructions: You were in the hospital for an infection with c-diff which colonized your colon. This will be treated with antibiotics. You will also be receiving antibiotics at the dialysis center for an infection in your blood.  Your blood pressure was a little low, so we are holding your imdur. It will be important for you to follow up with a primary care doctor or with your regular doctor to start you back on your imdur. Please follow up in a few days regarding this.    Condition at discharge: stable  Disposition: Home.   Pending Tests: None  Follow up: Follow-up Information   Follow up with primary care doctor of your choice. Schedule an appointment as soon as possible for a visit in 4 days.      Follow up with your gastroenterologist In 3 weeks. (for follow up of your colonoscopy findings)       Follow up Issues:  - f/u with GI  outpatient for possible future resection of malignant-appearing polyps on previous colonoscopy - F/u for resolution of symptoms - F/u CT abdomen in 3-6 months to further evaluate abnormalities noted in this admission CT    [name] 04/26/2013 9:03 AM PGY-1 Family Medicine Teaching Service Service pager [phone]

## 2013-04-26 NOTE — Discharge Summary (Signed)
FMTS Attending Note Patient's discharge summary reviewed by me and I agree with Dr. Lucienne Minks note.  Patient is recommended to follow up with primary care physician in the 4 days after discharge.  If she does not have a primary care provider, she may schedule follow up in the FMTS. Paula Compton, MD

## 2013-05-10 ENCOUNTER — Encounter: Payer: Self-pay | Admitting: Vascular Surgery

## 2013-05-11 ENCOUNTER — Ambulatory Visit (INDEPENDENT_AMBULATORY_CARE_PROVIDER_SITE_OTHER): Payer: Medicare Other | Admitting: Vascular Surgery

## 2013-05-11 ENCOUNTER — Encounter: Payer: Self-pay | Admitting: Vascular Surgery

## 2013-05-11 ENCOUNTER — Encounter (INDEPENDENT_AMBULATORY_CARE_PROVIDER_SITE_OTHER): Payer: Medicare Other | Admitting: *Deleted

## 2013-05-11 VITALS — BP 138/78 | HR 76 | Resp 16 | Ht 64.0 in | Wt 167.0 lb

## 2013-05-11 DIAGNOSIS — N184 Chronic kidney disease, stage 4 (severe): Secondary | ICD-10-CM

## 2013-05-11 DIAGNOSIS — I871 Compression of vein: Secondary | ICD-10-CM | POA: Insufficient documentation

## 2013-05-11 DIAGNOSIS — Z48812 Encounter for surgical aftercare following surgery on the circulatory system: Secondary | ICD-10-CM

## 2013-05-11 DIAGNOSIS — N186 End stage renal disease: Secondary | ICD-10-CM

## 2013-05-11 DIAGNOSIS — T82598A Other mechanical complication of other cardiac and vascular devices and implants, initial encounter: Secondary | ICD-10-CM

## 2013-05-11 NOTE — Progress Notes (Signed)
[facility]  Established Dialysis Access  History of Present Illness  [name] is a 72 y.o. 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  The flow rates in this fistula are dropping off down to 300-500.  The patient denies any bleeding complications or steal sx.  Past Medical History  Diagnosis Date  . Hypertension   . Hyperlipidemia   . Thyroid disease     Hypothyroidism  . Chronic kidney disease   . Autoimmune thrombocytopenia   . COPD (chronic obstructive pulmonary disease)   . Diverticulitis 2013 sept.      hospitalized  at [facility].  . Anginal pain   . Hypothyroidism   . Shortness of breath   . Pneumonia   . CHF (congestive heart failure)   . GERD (gastroesophageal reflux disease)   . Arthritis     Past Surgical History  Procedure Laterality Date  . Av fistula placement, brachiocephalic  11/04/2010    right arm  . Abdominal hysterectomy    . Colonoscopy  Dec 2013    colon cancer .. surgery  to be 2014    History   Social History  . Marital Status: Married    Spouse Name: N/A    Number of Children: N/A  . Years of Education: N/A   Occupational History  . Not on file.   Social History Main Topics  . Smoking status: Former Smoker    Types: Cigarettes  . Smokeless tobacco: Never Used  . Alcohol Use: No  . Drug Use: No  . Sexually Active: Not on file   Other Topics Concern  . Not on file   Social History Narrative  . No narrative on file    Family History  Problem Relation Age of Onset  . Heart disease Mother   . Cancer Father   . Kidney disease Brother     Current Outpatient Prescriptions on File Prior to Visit  Medication Sig Dispense Refill  . albuterol-ipratropium (COMBIVENT) 18-103 MCG/ACT inhaler Inhale 2 puffs into the lungs 2 (two) times daily as needed. For wheezing      . B Complex-C-Folic Acid  (NEPHRO-VITE PO) Take 1 tablet by mouth at bedtime.       . calcium acetate (PHOSLO) 667 MG capsule Take 667 mg by mouth 3 (three) times daily with meals.        . ceFAZolin (ANCEF) 2-3 GM-% SOLR Inject 50 mLs (2 g total) into the vein Every Tuesday,Thursday,and Saturday with dialysis. To be given at dialysis      . cinacalcet (SENSIPAR) 30 MG tablet Take 30 mg by mouth at bedtime.       . diphenhydrAMINE (BENADRYL) 25 mg capsule Take 25 mg by mouth every 6 (six) hours as needed. For allergies/sleep      . Fluticasone-Salmeterol (ADVAIR) 250-50 MCG/DOSE AEPB Inhale 1 puff into the lungs 2 (two) times daily as needed (for shortness of breath).      . levothyroxine (SYNTHROID, LEVOTHROID) 125 MCG tablet Take 125 mcg by mouth at bedtime.       . sevelamer (RENVELA) 800 MG tablet Take 2,400 mg by mouth 3 (three) times daily with meals.       . metoprolol succinate (TOPROL XL) 25 MG 24 hr tablet Take 1 tablet (25 mg total) by mouth daily.  30 tablet  2  .   metroNIDAZOLE (FLAGYL) 500 MG tablet Take 1 tablet (500 mg total) by mouth 3 (three) times daily.  75 tablet  0   No current facility-administered medications on file prior to visit.    Allergies  Allergen Reactions  . Shellfish-Derived Products Anaphylaxis    Review of Systems (Positive items checked otherwise negative)  General: [ ] Weight loss, [ ] Weight gain, [ ]  Loss of appetite, [ ] Fever  Neurologic: [ ] Dizziness, [ ] Blackouts, [ ] Headaches, [ ] Seizure  Ear/Nose/Throat: [ ] Change in eyesight, [ ] Change in hearing, [ ] Nose bleeds, [ ] Sore throat  Vascular: [ ] Pain in legs with walking, [ ] Pain in feet while lying flat, [ ] Non-healing ulcer, Stroke, [ ] "Mini stroke", [ ] Slurred speech, [ ] Temporary blindness, [ ] Blood clot in vein, [ ] Phlebitis  Pulmonary: [ ] Home oxygen, [ ] Productive cough, [ ] Bronchitis, [ ] Coughing up blood, [ ] Asthma, [ ] Wheezing  Musculoskeletal: [ ] Arthritis, [ ] Joint pain, [ ] Muscle  pain  Cardiac: [ ] Chest pain, [ ] Chest tightness/pressure, [ ] Shortness of breath when lying flat, [ ] Shortness of breath with exertion, [ ] Palpitations, [ ] Heart murmur, [ ] Arrythmia,  [ ] Atrial fibrillation  Hematologic: [ ] Bleeding problems, [ ] Clotting disorder, [ ] Anemia  Psychiatric:  [ ] Depression, [ ] Anxiety, [ ] Attention deficit disorder  Gastrointestinal:  [ ] Black stool,[ ]  Blood in stool, [ ] Peptic ulcer disease, [ ] Reflux, [ ] Hiatal hernia, [ ] Trouble swallowing, [ ] Diarrhea, [ ] Constipation  Urinary:  [x] Kidney disease, [ ] Burning with urination, [ ] Frequent urination, [ ] Difficulty urinating  Skin: [ ] Ulcers, [ ] Rashes    Physical Examination  Filed Vitals:   05/11/13 1219  BP: 138/78  Pulse: 76  Resp: 16  Height: 5' 4" (1.626 m)  Weight: 167 lb (75.751 kg)  SpO2: 100%   Body mass index is 28.65 kg/(m^2).  General: A&O x 3, WDWN  Pulmonary: Sym exp, good air movt, CTAB, no rales, rhonchi, & wheezing  Cardiac: RRR, Nl S1, S2, no Murmurs, rubs or gallops  Gastrointestinal: soft, NTND, -G/R, - HSM, - masses, - CVAT B  Musculoskeletal: M/S 5/5 throughout , Extremities without  ischemic changes , palpable pulsatile L BVT, + bruit in access  Neurologic: Pain and light touch intact in extremities , Motor exam as listed above  Non-Invasive Vascular Imaging  L arm acess duplex (Date: 05/11/13):   High grade stenoses proximally and mid-segment  Sclerotic valves evident  Medical Decision Making  [name] is a 72 y.o. female who presents with ESRD requiring hemodialysis, likely outflow stenosis in L BVT  I recommend L arm fistulogram, with possible intervention  I discussed with the patient the nature of angiographic procedures, especially the limited patencies of any endovascular intervention.  The patient is aware of that the risks of an angiographic procedure include but are not limited to: bleeding, infection, access site  complications, renal failure, embolization, rupture of vessel, dissection, possible need for emergent surgical intervention, possible need for surgical procedures to treat the patient's pathology, and stroke and death.  The patient is aware of the risks and agrees to proceed.  Given pt's shellfish allergy, the patient will get H2 blockers and steroid the day of the procedure.  The patient   has agreed to proceed with the above procedure which will be scheduled 2 JUN 14.  Marie Borowski, MD [facility] Office: [phone] Pager: [phone]  05/11/2013, 12:40 PM   

## 2013-05-15 ENCOUNTER — Encounter (HOSPITAL_COMMUNITY): Payer: Self-pay | Admitting: Pharmacist

## 2013-05-22 ENCOUNTER — Other Ambulatory Visit: Payer: Self-pay

## 2013-05-28 ENCOUNTER — Encounter (HOSPITAL_COMMUNITY)
Admission: RE | Disposition: A | Discharge status: Home / Self Care | Payer: Self-pay | Source: Ambulatory Visit | Attending: Vascular Surgery

## 2013-05-28 ENCOUNTER — Ambulatory Visit (HOSPITAL_COMMUNITY)
Admission: RE | Admit: 2013-05-28 | Discharge: 2013-05-28 | Disposition: A | Discharge status: Home / Self Care | Payer: Medicare Other | Source: Ambulatory Visit | Attending: Vascular Surgery | Admitting: Vascular Surgery

## 2013-05-28 ENCOUNTER — Telehealth: Payer: Self-pay | Admitting: Vascular Surgery

## 2013-05-28 ENCOUNTER — Other Ambulatory Visit: Payer: Self-pay | Admitting: *Deleted

## 2013-05-28 DIAGNOSIS — T82598A Other mechanical complication of other cardiac and vascular devices and implants, initial encounter: Secondary | ICD-10-CM | POA: Insufficient documentation

## 2013-05-28 DIAGNOSIS — Z8701 Personal history of pneumonia (recurrent): Secondary | ICD-10-CM | POA: Insufficient documentation

## 2013-05-28 DIAGNOSIS — K5730 Diverticulosis of large intestine without perforation or abscess without bleeding: Secondary | ICD-10-CM | POA: Insufficient documentation

## 2013-05-28 DIAGNOSIS — J449 Chronic obstructive pulmonary disease, unspecified: Secondary | ICD-10-CM | POA: Insufficient documentation

## 2013-05-28 DIAGNOSIS — E785 Hyperlipidemia, unspecified: Secondary | ICD-10-CM | POA: Insufficient documentation

## 2013-05-28 DIAGNOSIS — I509 Heart failure, unspecified: Secondary | ICD-10-CM | POA: Insufficient documentation

## 2013-05-28 DIAGNOSIS — C189 Malignant neoplasm of colon, unspecified: Secondary | ICD-10-CM | POA: Insufficient documentation

## 2013-05-28 DIAGNOSIS — N186 End stage renal disease: Secondary | ICD-10-CM | POA: Insufficient documentation

## 2013-05-28 DIAGNOSIS — Z91013 Allergy to seafood: Secondary | ICD-10-CM | POA: Insufficient documentation

## 2013-05-28 DIAGNOSIS — Z4931 Encounter for adequacy testing for hemodialysis: Secondary | ICD-10-CM

## 2013-05-28 DIAGNOSIS — E039 Hypothyroidism, unspecified: Secondary | ICD-10-CM | POA: Insufficient documentation

## 2013-05-28 DIAGNOSIS — T82898A Other specified complication of vascular prosthetic devices, implants and grafts, initial encounter: Secondary | ICD-10-CM

## 2013-05-28 DIAGNOSIS — D693 Immune thrombocytopenic purpura: Secondary | ICD-10-CM | POA: Insufficient documentation

## 2013-05-28 DIAGNOSIS — Y832 Surgical operation with anastomosis, bypass or graft as the cause of abnormal reaction of the patient, or of later complication, without mention of misadventure at the time of the procedure: Secondary | ICD-10-CM | POA: Insufficient documentation

## 2013-05-28 DIAGNOSIS — K219 Gastro-esophageal reflux disease without esophagitis: Secondary | ICD-10-CM | POA: Insufficient documentation

## 2013-05-28 DIAGNOSIS — I871 Compression of vein: Secondary | ICD-10-CM | POA: Insufficient documentation

## 2013-05-28 DIAGNOSIS — J4489 Other specified chronic obstructive pulmonary disease: Secondary | ICD-10-CM | POA: Insufficient documentation

## 2013-05-28 DIAGNOSIS — I120 Hypertensive chronic kidney disease with stage 5 chronic kidney disease or end stage renal disease: Secondary | ICD-10-CM | POA: Insufficient documentation

## 2013-05-28 DIAGNOSIS — Z87891 Personal history of nicotine dependence: Secondary | ICD-10-CM | POA: Insufficient documentation

## 2013-05-28 HISTORY — PX: SHUNTOGRAM: SHX5491

## 2013-05-28 LAB — POCT I-STAT, CHEM 8
Chloride: 100 mEq/L (ref 96–112)
HCT: 24 % — ABNORMAL LOW (ref 36.0–46.0)
Potassium: 4.5 mEq/L (ref 3.5–5.1)

## 2013-05-28 SURGERY — ASSESSMENT, SHUNT FUNCTION, WITH CONTRAST RADIOGRAPHIC STUDY
Anesthesia: LOCAL | Laterality: Left

## 2013-05-28 MED ORDER — METHYLPREDNISOLONE SODIUM SUCC 125 MG IJ SOLR
INTRAMUSCULAR | Status: AC
  Administered 2013-05-28: 125 mg via INTRAVENOUS
  Filled 2013-05-28: qty 2

## 2013-05-28 MED ORDER — DIPHENHYDRAMINE HCL 50 MG/ML IJ SOLN: 25.0000 mg | INTRAMUSCULAR | Status: AC

## 2013-05-28 MED ORDER — HEPARIN SODIUM (PORCINE) 1000 UNIT/ML IJ SOLN
INTRAMUSCULAR | Status: AC
  Filled 2013-05-28: qty 1

## 2013-05-28 MED ORDER — HEPARIN (PORCINE) IN NACL 2-0.9 UNIT/ML-% IJ SOLN
INTRAMUSCULAR | Status: AC
  Filled 2013-05-28: qty 500

## 2013-05-28 MED ORDER — METHYLPREDNISOLONE SODIUM SUCC 125 MG IJ SOLR: 125.0000 mg | INTRAMUSCULAR | Status: AC

## 2013-05-28 MED ORDER — DIPHENHYDRAMINE HCL 50 MG/ML IJ SOLN
INTRAMUSCULAR | Status: AC
  Administered 2013-05-28: 25 mg via INTRAVENOUS
  Filled 2013-05-28: qty 1

## 2013-05-28 MED ORDER — SODIUM CHLORIDE 0.9 % IJ SOLN: 3.0000 mL | INTRAMUSCULAR | Status: DC | PRN

## 2013-05-28 MED ORDER — FAMOTIDINE IN NACL 20-0.9 MG/50ML-% IV SOLN
20.0000 mg | INTRAVENOUS | Status: AC
  Administered 2013-05-28: 20 mg via INTRAVENOUS
  Filled 2013-05-28: qty 50

## 2013-05-28 MED ORDER — LIDOCAINE HCL (PF) 1 % IJ SOLN
INTRAMUSCULAR | Status: AC
  Filled 2013-05-28: qty 30

## 2013-05-28 NOTE — Telephone Encounter (Addendum)
Message copied by Fredrich Birks on Mon May 28, 2013  3:11 PM ------      Message from: Lorin Mercy K      Created: Mon May 28, 2013  2:26 PM      Regarding: schedule                   ----- Message -----         From: Melene Plan, RN         Sent: 05/28/2013   2:08 PM           To: Sharee Pimple, CMA, [facility]                        ----- Message -----         From: Fransisco Hertz, MD         Sent: 05/28/2013   1:47 PM           To: Reuel Derby, Melene Plan, RN            [name]      [id number]      August 29, 1941            PROCEDURE:      1.  left basilic vein transposition cannulation under ultrasound guidance      2.  left arm fistulogram      3.  Venoplasty of basilic vein (Angiosculpt 6 mm x 40 mm)            Follow-up: 3 months            Orders(s) for follow-up: L arm access duplex       ------  Lm for pt on home # regarding appt information, dpm

## 2013-05-28 NOTE — Op Note (Signed)
OPERATIVE NOTE   PROCEDURE: 1.  left basilic vein transposition cannulation under ultrasound guidance 2.  left arm fistulogram 3.  Venoplasty of basilic vein (Angiosculpt 6 mm x 40 mm)  PRE-OPERATIVE DIAGNOSIS: Malfunctioning left basilic vein transposition   POST-OPERATIVE DIAGNOSIS: same as above   SURGEON: Leonides Sake, MD  ANESTHESIA: local  ESTIMATED BLOOD LOSS: 5 cc  FINDING(S): 1. >90% stenosis proximal 1/3 basilic vein with tandem stenosis ~50% stenosis ~2 3 cm distal to proximal stenosis: near resolution, 6-6.5 mm lumen after venoplasty, post-stenotic dilation evident proximal to stenoses 2. Calcified valve distal to stenoses: non-flow limiting  SPECIMEN(S):  None  CONTRAST: 25 cc  INDICATIONS: [name] is a 72 y.o. female who presents with malfunctioning left basilic vein transposition.  The patient is scheduled for left arm  Fistulogram, possible intervention.  The patient is aware the risks include but are not limited to: bleeding, infection, thrombosis of the cannulated access, and possible anaphylactic reaction to the contrast.  The patient is aware of the risks of the procedure and elects to proceed forward.  DESCRIPTION: After full informed written consent was obtained, the patient was brought back to the angiography suite and placed supine upon the angiography table.  The patient was connected to monitoring equipment.  The left arm was prepped and draped in the standard fashion for a percutaneous access intervention.  Under ultrasound guidance, the left basilic vein transposition was cannulated with a micropuncture needle.  The microwire was advanced into the fistula and the needle was exchanged for the a microsheath, which was lodged 2 cm into the access.  The wire was removed and the sheath was connected to the IV extension tubing.  Hand injections were completed to image the access from the antecubitum up to the level of axilla.  The central venous structures  were also imaged by hand injections.  Based on the images, this patient will need: venoplasty of the stenosis.  A Benson wire was advanced into the axillary vein and the sheath was exchanged for a short 6-Fr sheath.    Based on the the imaging, a 6 mm x 40 mm Angiosculpt cutting angioplasty balloon was selected.  The balloon was centered around the two stenoses and calcified valve and inflated to 8 atm for 2 minutes.  On completion imaging, there remained some residual luminal irregularies.  I placed the Angiosculpt back in and centered it more on the proximal stenosis.  The balloon was centered around the stenosis and inflated to 8 atm for 2 minutes.  On completion imaging, there was near resolution of the stenosis with residual calcification at the valve.  There was no evidence of hemodynamic stenosis on inflation at the level of the valve.  Based on the completion imaging, no further intervention is necessary.  The wire and balloon were removed from the sheath.  A 4-0 Monocryl purse-string suture was sewn around the sheath.  The sheath was removed while tying down the suture.  A sterile bandage was applied to the puncture site.  COMPLICATIONS: none  CONDITION: stable  Leonides Sake, MD [facility] Office: [phone] Pager: [phone]  05/28/2013 1:09 PM

## 2013-05-28 NOTE — H&P (View-Only) (Signed)
[facility] [place]  Established Dialysis Access  History of Present Illness  [name] is a 72 y.o. 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  The flow rates in this fistula are dropping off down to 300-500.  The patient denies any bleeding complications or steal sx.  Past Medical History  Diagnosis Date  . Hypertension   . Hyperlipidemia   . Thyroid disease     Hypothyroidism  . Chronic kidney disease   . Autoimmune thrombocytopenia   . COPD (chronic obstructive pulmonary disease)   . Diverticulitis 2013 sept.      hospitalized  at [facility].  . Anginal pain   . Hypothyroidism   . Shortness of breath   . Pneumonia   . CHF (congestive heart failure)   . GERD (gastroesophageal reflux disease)   . Arthritis     Past Surgical History  Procedure Laterality Date  . Av fistula placement, brachiocephalic  11/04/2010    right arm  . Abdominal hysterectomy    . Colonoscopy  Dec 2013    colon cancer .. surgery  to be 2014    History   Social History  . Marital Status: Married    Spouse Name: N/A    Number of Children: N/A  . Years of Education: N/A   Occupational History  . Not on file.   Social History Main Topics  . Smoking status: Former Smoker    Types: Cigarettes  . Smokeless tobacco: Never Used  . Alcohol Use: No  . Drug Use: No  . Sexually Active: Not on file   Other Topics Concern  . Not on file   Social History Narrative  . No narrative on file    Family History  Problem Relation Age of Onset  . Heart disease Mother   . Cancer Father   . Kidney disease Brother     Current Outpatient Prescriptions on File Prior to Visit  Medication Sig Dispense Refill  . albuterol-ipratropium (COMBIVENT) 18-103 MCG/ACT inhaler Inhale 2 puffs into the lungs 2 (two) times daily as needed. For wheezing      . B Complex-C-Folic Acid  (NEPHRO-VITE PO) Take 1 tablet by mouth at bedtime.       . calcium acetate (PHOSLO) 667 MG capsule Take 667 mg by mouth 3 (three) times daily with meals.        Marland Kitchen ceFAZolin (ANCEF) 2-3 GM-% SOLR Inject 50 mLs (2 g total) into the vein Every Tuesday,Thursday,and Saturday with dialysis. To be given at dialysis      . cinacalcet (SENSIPAR) 30 MG tablet Take 30 mg by mouth at bedtime.       . diphenhydrAMINE (BENADRYL) 25 mg capsule Take 25 mg by mouth every 6 (six) hours as needed. For allergies/sleep      . Fluticasone-Salmeterol (ADVAIR) 250-50 MCG/DOSE AEPB Inhale 1 puff into the lungs 2 (two) times daily as needed (for shortness of breath).      Marland Kitchen levothyroxine (SYNTHROID, LEVOTHROID) 125 MCG tablet Take 125 mcg by mouth at bedtime.       . sevelamer (RENVELA) 800 MG tablet Take 2,400 mg by mouth 3 (three) times daily with meals.       . metoprolol succinate (TOPROL XL) 25 MG 24 hr tablet Take 1 tablet (25 mg total) by mouth daily.  30 tablet  2  .  metroNIDAZOLE (FLAGYL) 500 MG tablet Take 1 tablet (500 mg total) by mouth 3 (three) times daily.  75 tablet  0   No current facility-administered medications on file prior to visit.    Allergies  Allergen Reactions  . Shellfish-Derived Products Anaphylaxis    Review of Systems (Positive items checked otherwise negative)  General: [ ]  Weight loss, [ ]  Weight gain, [ ]   Loss of appetite, [ ]  Fever  Neurologic: [ ]  Dizziness, [ ]  Blackouts, [ ]  Headaches, [ ]  Seizure  Ear/Nose/Throat: [ ]  Change in eyesight, [ ]  Change in hearing, [ ]  Nose bleeds, [ ]  Sore throat  Vascular: [ ]  Pain in legs with walking, [ ]  Pain in feet while lying flat, [ ]  Non-healing ulcer, Stroke, [ ]  "Mini stroke", [ ]  Slurred speech, [ ]  Temporary blindness, [ ]  Blood clot in vein, [ ]  Phlebitis  Pulmonary: [ ]  Home oxygen, [ ]  Productive cough, [ ]  Bronchitis, [ ]  Coughing up blood, [ ]  Asthma, [ ]  Wheezing  Musculoskeletal: [ ]  Arthritis, [ ]  Joint pain, [ ]  Muscle  pain  Cardiac: [ ]  Chest pain, [ ]  Chest tightness/pressure, [ ]  Shortness of breath when lying flat, [ ]  Shortness of breath with exertion, [ ]  Palpitations, [ ]  Heart murmur, [ ]  Arrythmia,  [ ]  Atrial fibrillation  Hematologic: [ ]  Bleeding problems, [ ]  Clotting disorder, [ ]  Anemia  Psychiatric:  [ ]  Depression, [ ]  Anxiety, [ ]  Attention deficit disorder  Gastrointestinal:  [ ]  Black stool,[ ]   Blood in stool, [ ]  Peptic ulcer disease, [ ]  Reflux, [ ]  Hiatal hernia, [ ]  Trouble swallowing, [ ]  Diarrhea, [ ]  Constipation  Urinary:  [x]  Kidney disease, [ ]  Burning with urination, [ ]  Frequent urination, [ ]  Difficulty urinating  Skin: [ ]  Ulcers, [ ]  Rashes    Physical Examination  Filed Vitals:   05/11/13 1219  BP: 138/78  Pulse: 76  Resp: 16  Height: 5\' 4"  (1.626 m)  Weight: 167 lb (75.751 kg)  SpO2: 100%   Body mass index is 28.65 kg/(m^2).  General: A&O x 3, WDWN  Pulmonary: Sym exp, good air movt, CTAB, no rales, rhonchi, & wheezing  Cardiac: RRR, Nl S1, S2, no Murmurs, rubs or gallops  Gastrointestinal: soft, NTND, -G/R, - HSM, - masses, - CVAT B  Musculoskeletal: M/S 5/5 throughout , Extremities without  ischemic changes , palpable pulsatile L BVT, + bruit in access  Neurologic: Pain and light touch intact in extremities , Motor exam as listed above  Non-Invasive Vascular Imaging  L arm acess duplex (Date: 05/11/13):   High grade stenoses proximally and mid-segment  Sclerotic valves evident  Medical Decision Making  [name] is a 72 y.o. female who presents with ESRD requiring hemodialysis, likely outflow stenosis in L BVT  I recommend L arm fistulogram, with possible intervention  I discussed with the patient the nature of angiographic procedures, especially the limited patencies of any endovascular intervention.  The patient is aware of that the risks of an angiographic procedure include but are not limited to: bleeding, infection, access site  complications, renal failure, embolization, rupture of vessel, dissection, possible need for emergent surgical intervention, possible need for surgical procedures to treat the patient's pathology, and stroke and death.  The patient is aware of the risks and agrees to proceed.  Given pt's shellfish allergy, the patient will get H2 blockers and steroid the day of the procedure.  The patient  has agreed to proceed with the above procedure which will be scheduled 2 JUN 14.  Leonides Sake, MD [facility] [place] Office: [phone] Pager: [phone]  05/11/2013, 12:40 PM

## 2013-05-28 NOTE — Interval H&P Note (Signed)
[facility] [place]  History and Physical Update  The patient was interviewed and re-examined.  The patient's previous History and Physical has been reviewed and is unchanged.  There is no change in the plan of care: L arm fistulogram, with possible intervention.  Leonides Sake, MD [facility] [place] Office: [phone] Pager: [phone]  05/28/2013, 7:49 AM

## 2013-07-13 ENCOUNTER — Ambulatory Visit: Payer: Self-pay | Admitting: Vascular Surgery

## 2013-07-18 ENCOUNTER — Ambulatory Visit: Payer: Self-pay | Admitting: Oncology

## 2013-07-18 LAB — CBC CANCER CENTER
Basophil #: 0 x10 3/mm (ref 0.0–0.1)
Eosinophil #: 0.1 x10 3/mm (ref 0.0–0.7)
HCT: 34.5 % — ABNORMAL LOW (ref 35.0–47.0)
HGB: 11.3 g/dL — ABNORMAL LOW (ref 12.0–16.0)
Lymphocyte %: 28.4 %
MCH: 31.4 pg (ref 26.0–34.0)
Monocyte #: 0.4 x10 3/mm (ref 0.2–0.9)
Neutrophil %: 60.7 %
Platelet: 249 x10 3/mm (ref 150–440)
RBC: 3.6 10*6/uL — ABNORMAL LOW (ref 3.80–5.20)
RDW: 17.7 % — ABNORMAL HIGH (ref 11.5–14.5)

## 2013-07-18 LAB — IRON AND TIBC
Iron Bind.Cap.(Total): 194 ug/dL — ABNORMAL LOW (ref 250–450)
Iron Saturation: 28 %
Iron: 55 ug/dL (ref 50–170)
Unbound Iron-Bind.Cap.: 139 ug/dL

## 2013-07-18 LAB — COMPREHENSIVE METABOLIC PANEL
Alkaline Phosphatase: 85 U/L (ref 50–136)
BUN: 26 mg/dL — ABNORMAL HIGH (ref 7–18)
Calcium, Total: 8.1 mg/dL — ABNORMAL LOW (ref 8.5–10.1)
EGFR (African American): 8 — ABNORMAL LOW
EGFR (Non-African Amer.): 7 — ABNORMAL LOW
Osmolality: 278 (ref 275–301)
Potassium: 5 mmol/L (ref 3.5–5.1)
SGOT(AST): 13 U/L — ABNORMAL LOW (ref 15–37)
SGPT (ALT): 11 U/L — ABNORMAL LOW (ref 12–78)
Total Protein: 7.5 g/dL (ref 6.4–8.2)

## 2013-07-18 LAB — FERRITIN: Ferritin (ARMC): 536 ng/mL — ABNORMAL HIGH (ref 8–388)

## 2013-07-27 ENCOUNTER — Ambulatory Visit: Payer: Self-pay | Admitting: Oncology

## 2013-08-29 ENCOUNTER — Ambulatory Visit: Payer: Self-pay | Admitting: Oncology

## 2013-08-30 ENCOUNTER — Encounter: Payer: Self-pay | Admitting: Vascular Surgery

## 2013-08-30 LAB — CEA: CEA: 6.6 ng/mL — ABNORMAL HIGH (ref 0.0–4.7)

## 2013-08-31 ENCOUNTER — Ambulatory Visit: Payer: Medicare Other | Admitting: Vascular Surgery

## 2013-09-05 ENCOUNTER — Ambulatory Visit: Payer: Self-pay | Admitting: Vascular Surgery

## 2013-09-11 LAB — COMPREHENSIVE METABOLIC PANEL
Albumin: 3.6 g/dL (ref 3.4–5.0)
Anion Gap: 8 (ref 7–16)
BUN: 10 mg/dL (ref 7–18)
Calcium, Total: 9.9 mg/dL (ref 8.5–10.1)
Co2: 32 mmol/L (ref 21–32)
Osmolality: 273 (ref 275–301)
Potassium: 4.1 mmol/L (ref 3.5–5.1)
SGOT(AST): 13 U/L — ABNORMAL LOW (ref 15–37)
SGPT (ALT): 11 U/L — ABNORMAL LOW (ref 12–78)
Sodium: 137 mmol/L (ref 136–145)
Total Protein: 7.5 g/dL (ref 6.4–8.2)

## 2013-09-11 LAB — CBC
HCT: 35.6 % (ref 35.0–47.0)
HGB: 11.9 g/dL — ABNORMAL LOW (ref 12.0–16.0)
Platelet: 152 10*3/uL (ref 150–440)
RBC: 3.89 10*6/uL (ref 3.80–5.20)
RDW: 17.4 % — ABNORMAL HIGH (ref 11.5–14.5)
WBC: 5.1 10*3/uL (ref 3.6–11.0)

## 2013-09-12 ENCOUNTER — Inpatient Hospital Stay: Payer: Self-pay | Admitting: Internal Medicine

## 2013-09-13 LAB — CBC WITH DIFFERENTIAL/PLATELET
Basophil %: 0.9 %
Eosinophil #: 0.1 10*3/uL (ref 0.0–0.7)
Eosinophil %: 2.2 %
HCT: 33.1 % — ABNORMAL LOW (ref 35.0–47.0)
Lymphocyte #: 1.2 10*3/uL (ref 1.0–3.6)
MCH: 31 pg (ref 26.0–34.0)
MCHC: 33.5 g/dL (ref 32.0–36.0)
MCV: 93 fL (ref 80–100)
Monocyte #: 0.4 x10 3/mm (ref 0.2–0.9)
Monocyte %: 7.5 %
Neutrophil #: 3.9 10*3/uL (ref 1.4–6.5)
Neutrophil %: 68.1 %
Platelet: 167 10*3/uL (ref 150–440)
RBC: 3.58 10*6/uL — ABNORMAL LOW (ref 3.80–5.20)
RDW: 17.6 % — ABNORMAL HIGH (ref 11.5–14.5)

## 2013-09-13 LAB — BASIC METABOLIC PANEL
Anion Gap: 11 (ref 7–16)
BUN: 26 mg/dL — ABNORMAL HIGH (ref 7–18)
Calcium, Total: 9.2 mg/dL (ref 8.5–10.1)
Chloride: 94 mmol/L — ABNORMAL LOW (ref 98–107)
Creatinine: 7.17 mg/dL — ABNORMAL HIGH (ref 0.60–1.30)
EGFR (African American): 6 — ABNORMAL LOW
EGFR (Non-African Amer.): 5 — ABNORMAL LOW
Glucose: 78 mg/dL (ref 65–99)
Osmolality: 270 (ref 275–301)
Potassium: 4.5 mmol/L (ref 3.5–5.1)

## 2013-09-14 LAB — CBC WITH DIFFERENTIAL/PLATELET
Basophil #: 0.1 10*3/uL (ref 0.0–0.1)
HCT: 31.8 % — ABNORMAL LOW (ref 35.0–47.0)
Lymphocyte #: 0.9 10*3/uL — ABNORMAL LOW (ref 1.0–3.6)
Lymphocyte %: 18 %
MCH: 30.8 pg (ref 26.0–34.0)
MCHC: 33.4 g/dL (ref 32.0–36.0)
Monocyte #: 0.5 x10 3/mm (ref 0.2–0.9)
Neutrophil #: 3.5 10*3/uL (ref 1.4–6.5)
Platelet: 170 10*3/uL (ref 150–440)

## 2013-09-14 LAB — BASIC METABOLIC PANEL
Anion Gap: 8 (ref 7–16)
BUN: 12 mg/dL (ref 7–18)
Chloride: 96 mmol/L — ABNORMAL LOW (ref 98–107)
Glucose: 94 mg/dL (ref 65–99)
Osmolality: 266 (ref 275–301)
Potassium: 4.1 mmol/L (ref 3.5–5.1)

## 2013-09-15 LAB — CBC WITH DIFFERENTIAL/PLATELET
Eosinophil %: 3.4 %
HCT: 31.5 % — ABNORMAL LOW (ref 35.0–47.0)
Lymphocyte #: 1.2 10*3/uL (ref 1.0–3.6)
Lymphocyte %: 23.7 %
MCH: 31.3 pg (ref 26.0–34.0)
MCHC: 33.5 g/dL (ref 32.0–36.0)
Monocyte #: 0.5 x10 3/mm (ref 0.2–0.9)
Neutrophil #: 3.1 10*3/uL (ref 1.4–6.5)
Neutrophil %: 62.1 %
RBC: 3.37 10*6/uL — ABNORMAL LOW (ref 3.80–5.20)
WBC: 4.9 10*3/uL (ref 3.6–11.0)

## 2013-09-15 LAB — BASIC METABOLIC PANEL
Anion Gap: 7 (ref 7–16)
Calcium, Total: 8.8 mg/dL (ref 8.5–10.1)
Creatinine: 6.62 mg/dL — ABNORMAL HIGH (ref 0.60–1.30)
EGFR (African American): 7 — ABNORMAL LOW
EGFR (Non-African Amer.): 6 — ABNORMAL LOW
Glucose: 98 mg/dL (ref 65–99)
Sodium: 135 mmol/L — ABNORMAL LOW (ref 136–145)

## 2013-09-17 LAB — COMPREHENSIVE METABOLIC PANEL
Albumin: 3.4 g/dL (ref 3.4–5.0)
Alkaline Phosphatase: 73 U/L (ref 50–136)
Bilirubin,Total: 0.5 mg/dL (ref 0.2–1.0)
Chloride: 99 mmol/L (ref 98–107)
Creatinine: 6.73 mg/dL — ABNORMAL HIGH (ref 0.60–1.30)
EGFR (African American): 6 — ABNORMAL LOW
EGFR (Non-African Amer.): 6 — ABNORMAL LOW
Glucose: 99 mg/dL (ref 65–99)
Osmolality: 274 (ref 275–301)
Potassium: 3.6 mmol/L (ref 3.5–5.1)
SGOT(AST): 32 U/L (ref 15–37)
Sodium: 137 mmol/L (ref 136–145)

## 2013-09-17 LAB — CBC WITH DIFFERENTIAL/PLATELET
Basophil #: 0 10*3/uL (ref 0.0–0.1)
Basophil %: 1.1 %
Eosinophil #: 0.2 10*3/uL (ref 0.0–0.7)
HGB: 10.6 g/dL — ABNORMAL LOW (ref 12.0–16.0)
MCH: 31 pg (ref 26.0–34.0)
MCHC: 33.4 g/dL (ref 32.0–36.0)
Neutrophil #: 1.9 10*3/uL (ref 1.4–6.5)
Neutrophil %: 50.9 %
Platelet: 201 10*3/uL (ref 150–440)
RBC: 3.41 10*6/uL — ABNORMAL LOW (ref 3.80–5.20)
WBC: 3.7 10*3/uL (ref 3.6–11.0)

## 2013-09-17 LAB — CULTURE, BLOOD (SINGLE)

## 2013-09-18 LAB — PHOSPHORUS: Phosphorus: 3.9 mg/dL (ref 2.5–4.9)

## 2013-09-19 LAB — COMPREHENSIVE METABOLIC PANEL
Albumin: 3.6 g/dL (ref 3.4–5.0)
Anion Gap: 5 — ABNORMAL LOW (ref 7–16)
Bilirubin,Total: 0.4 mg/dL (ref 0.2–1.0)
Co2: 32 mmol/L (ref 21–32)
Creatinine: 5.15 mg/dL — ABNORMAL HIGH (ref 0.60–1.30)
EGFR (African American): 9 — ABNORMAL LOW
EGFR (Non-African Amer.): 8 — ABNORMAL LOW
SGOT(AST): 27 U/L (ref 15–37)
Sodium: 138 mmol/L (ref 136–145)
Total Protein: 7.6 g/dL (ref 6.4–8.2)

## 2013-09-19 LAB — CBC WITH DIFFERENTIAL/PLATELET
Eosinophil #: 0.3 10*3/uL (ref 0.0–0.7)
HGB: 11.8 g/dL — ABNORMAL LOW (ref 12.0–16.0)
MCH: 31.6 pg (ref 26.0–34.0)
MCHC: 33.9 g/dL (ref 32.0–36.0)
MCV: 93 fL (ref 80–100)
Neutrophil #: 2.9 10*3/uL (ref 1.4–6.5)
Neutrophil %: 54.8 %
RBC: 3.74 10*6/uL — ABNORMAL LOW (ref 3.80–5.20)
RDW: 18 % — ABNORMAL HIGH (ref 11.5–14.5)
WBC: 5.4 10*3/uL (ref 3.6–11.0)

## 2013-09-20 LAB — CBC WITH DIFFERENTIAL/PLATELET
Basophil #: 0 10*3/uL (ref 0.0–0.1)
Basophil %: 0.7 %
Eosinophil #: 0.1 10*3/uL (ref 0.0–0.7)
Eosinophil %: 1.1 %
HCT: 26.5 % — ABNORMAL LOW (ref 35.0–47.0)
HGB: 9 g/dL — ABNORMAL LOW (ref 12.0–16.0)
Lymphocyte %: 15.9 %
MCH: 31.9 pg (ref 26.0–34.0)
MCHC: 34 g/dL (ref 32.0–36.0)
Monocyte #: 0.5 x10 3/mm (ref 0.2–0.9)
Monocyte %: 7.3 %
Neutrophil #: 5 10*3/uL (ref 1.4–6.5)
Neutrophil %: 75 %
Platelet: 149 10*3/uL — ABNORMAL LOW (ref 150–440)
RBC: 2.82 10*6/uL — ABNORMAL LOW (ref 3.80–5.20)
RDW: 18.2 % — ABNORMAL HIGH (ref 11.5–14.5)
WBC: 6.7 10*3/uL (ref 3.6–11.0)

## 2013-09-20 LAB — RENAL FUNCTION PANEL
Albumin: 2.6 g/dL — ABNORMAL LOW (ref 3.4–5.0)
Anion Gap: 7 (ref 7–16)
BUN: 19 mg/dL — ABNORMAL HIGH (ref 7–18)
Calcium, Total: 8.1 mg/dL — ABNORMAL LOW (ref 8.5–10.1)
Chloride: 100 mmol/L (ref 98–107)
EGFR (African American): 6 — ABNORMAL LOW
EGFR (Non-African Amer.): 5 — ABNORMAL LOW
Glucose: 99 mg/dL (ref 65–99)
Phosphorus: 4.9 mg/dL (ref 2.5–4.9)
Potassium: 3.9 mmol/L (ref 3.5–5.1)
Sodium: 136 mmol/L (ref 136–145)

## 2013-09-21 LAB — BASIC METABOLIC PANEL
Chloride: 100 mmol/L (ref 98–107)
Creatinine: 4.78 mg/dL — ABNORMAL HIGH (ref 0.60–1.30)
EGFR (African American): 10 — ABNORMAL LOW
EGFR (Non-African Amer.): 8 — ABNORMAL LOW
Glucose: 94 mg/dL (ref 65–99)
Osmolality: 268 (ref 275–301)
Potassium: 4.1 mmol/L (ref 3.5–5.1)

## 2013-09-22 LAB — CBC WITH DIFFERENTIAL/PLATELET
Eosinophil #: 0.2 10*3/uL (ref 0.0–0.7)
HGB: 7.5 g/dL — ABNORMAL LOW (ref 12.0–16.0)
MCH: 31.9 pg (ref 26.0–34.0)
MCV: 93 fL (ref 80–100)
Monocyte %: 6.3 %
Neutrophil #: 3.3 10*3/uL (ref 1.4–6.5)
Neutrophil %: 68.5 %
RDW: 18.3 % — ABNORMAL HIGH (ref 11.5–14.5)
WBC: 4.8 10*3/uL (ref 3.6–11.0)

## 2013-09-22 LAB — RENAL FUNCTION PANEL
Albumin: 2.3 g/dL — ABNORMAL LOW (ref 3.4–5.0)
BUN: 23 mg/dL — ABNORMAL HIGH (ref 7–18)
EGFR (Non-African Amer.): 5 — ABNORMAL LOW
Osmolality: 271 (ref 275–301)
Phosphorus: 4.8 mg/dL (ref 2.5–4.9)
Sodium: 133 mmol/L — ABNORMAL LOW (ref 136–145)

## 2013-09-24 LAB — PATHOLOGY REPORT

## 2013-09-25 LAB — PHOSPHORUS: Phosphorus: 6.3 mg/dL — ABNORMAL HIGH (ref 2.5–4.9)

## 2013-09-26 ENCOUNTER — Ambulatory Visit: Payer: Self-pay | Admitting: Oncology

## 2013-09-27 LAB — CBC WITH DIFFERENTIAL/PLATELET
Basophil %: 0.7 %
Eosinophil #: 0.1 10*3/uL (ref 0.0–0.7)
Eosinophil %: 1.2 %
HCT: 23.6 % — ABNORMAL LOW (ref 35.0–47.0)
Lymphocyte #: 0.9 10*3/uL — ABNORMAL LOW (ref 1.0–3.6)
Lymphocyte %: 11.7 %
Monocyte #: 0.4 x10 3/mm (ref 0.2–0.9)
Neutrophil #: 6.2 10*3/uL (ref 1.4–6.5)
Platelet: 313 10*3/uL (ref 150–440)
RBC: 2.52 10*6/uL — ABNORMAL LOW (ref 3.80–5.20)
RDW: 18.3 % — ABNORMAL HIGH (ref 11.5–14.5)
WBC: 7.7 10*3/uL (ref 3.6–11.0)

## 2013-09-27 LAB — PHOSPHORUS: Phosphorus: 3.7 mg/dL (ref 2.5–4.9)

## 2013-10-07 ENCOUNTER — Inpatient Hospital Stay: Payer: Self-pay | Admitting: Internal Medicine

## 2013-10-07 LAB — COMPREHENSIVE METABOLIC PANEL
Anion Gap: 10 (ref 7–16)
BUN: 18 mg/dL (ref 7–18)
Bilirubin,Total: 0.6 mg/dL (ref 0.2–1.0)
Calcium, Total: 10.7 mg/dL — ABNORMAL HIGH (ref 8.5–10.1)
Chloride: 96 mmol/L — ABNORMAL LOW (ref 98–107)
Co2: 33 mmol/L — ABNORMAL HIGH (ref 21–32)
EGFR (Non-African Amer.): 7 — ABNORMAL LOW
Potassium: 3.2 mmol/L — ABNORMAL LOW (ref 3.5–5.1)
SGOT(AST): 15 U/L (ref 15–37)
SGPT (ALT): 15 U/L (ref 12–78)
Sodium: 139 mmol/L (ref 136–145)

## 2013-10-07 LAB — CBC
HGB: 10.1 g/dL — ABNORMAL LOW (ref 12.0–16.0)
MCH: 32.1 pg (ref 26.0–34.0)
MCV: 93 fL (ref 80–100)
Platelet: 358 10*3/uL (ref 150–440)
RDW: 18.7 % — ABNORMAL HIGH (ref 11.5–14.5)
WBC: 9.5 10*3/uL (ref 3.6–11.0)

## 2013-10-07 LAB — TROPONIN I: Troponin-I: 0.02 ng/mL

## 2013-10-07 LAB — CK TOTAL AND CKMB (NOT AT ARMC): CK-MB: <0.5 ng/mL — ABNORMAL LOW (ref 0.5–3.6)

## 2013-10-07 LAB — LIPASE, BLOOD: Lipase: 265 U/L (ref 73–393)

## 2013-10-08 LAB — TROPONIN I: Troponin-I: 0.04 ng/mL

## 2013-10-08 LAB — CK TOTAL AND CKMB (NOT AT ARMC)
CK, Total: 18 U/L — ABNORMAL LOW (ref 21–215)
CK-MB: <0.5 ng/mL — ABNORMAL LOW (ref 0.5–3.6)

## 2013-10-09 LAB — CBC WITH DIFFERENTIAL/PLATELET
Basophil %: 0.2 %
Eosinophil #: 0.2 10*3/uL (ref 0.0–0.7)
Eosinophil %: 3.5 %
MCV: 94 fL (ref 80–100)
Monocyte #: 0.3 x10 3/mm (ref 0.2–0.9)
Monocyte %: 5.2 %
Neutrophil #: 3.4 10*3/uL (ref 1.4–6.5)
Neutrophil %: 70 %
Platelet: 236 10*3/uL (ref 150–440)
RBC: 2.41 10*6/uL — ABNORMAL LOW (ref 3.80–5.20)

## 2013-10-09 LAB — BASIC METABOLIC PANEL
Anion Gap: 8 (ref 7–16)
BUN: 24 mg/dL — ABNORMAL HIGH (ref 7–18)
Co2: 31 mmol/L (ref 21–32)
EGFR (African American): 6 — ABNORMAL LOW
Glucose: 91 mg/dL (ref 65–99)
Potassium: 2.8 mmol/L — ABNORMAL LOW (ref 3.5–5.1)
Sodium: 140 mmol/L (ref 136–145)

## 2013-10-09 LAB — PHOSPHORUS: Phosphorus: 3.6 mg/dL (ref 2.5–4.9)

## 2013-10-11 LAB — BASIC METABOLIC PANEL
BUN: 13 mg/dL (ref 7–18)
Chloride: 103 mmol/L (ref 98–107)
Co2: 27 mmol/L (ref 21–32)
Creatinine: 5.33 mg/dL — ABNORMAL HIGH (ref 0.60–1.30)
EGFR (African American): 9 — ABNORMAL LOW
Glucose: 91 mg/dL (ref 65–99)
Osmolality: 275 (ref 275–301)
Potassium: 3.4 mmol/L — ABNORMAL LOW (ref 3.5–5.1)
Sodium: 138 mmol/L (ref 136–145)

## 2013-10-11 LAB — CBC WITH DIFFERENTIAL/PLATELET
Basophil #: 0 10*3/uL (ref 0.0–0.1)
Basophil %: 0.6 %
Eosinophil #: 0.1 10*3/uL (ref 0.0–0.7)
Eosinophil %: 2.8 %
HCT: 24.9 % — ABNORMAL LOW (ref 35.0–47.0)
HGB: 8.4 g/dL — ABNORMAL LOW (ref 12.0–16.0)
Lymphocyte #: 0.9 10*3/uL — ABNORMAL LOW (ref 1.0–3.6)
Monocyte #: 0.2 x10 3/mm (ref 0.2–0.9)
Monocyte %: 6.5 %
Neutrophil #: 2.3 10*3/uL (ref 1.4–6.5)
Neutrophil %: 64.2 %
Platelet: 190 10*3/uL (ref 150–440)

## 2013-10-11 LAB — PHOSPHORUS: Phosphorus: 1.9 mg/dL — ABNORMAL LOW (ref 2.5–4.9)

## 2013-10-13 LAB — CBC WITH DIFFERENTIAL/PLATELET
Eosinophil #: 0.1 10*3/uL (ref 0.0–0.7)
HGB: 8.6 g/dL — ABNORMAL LOW (ref 12.0–16.0)
Lymphocyte #: 1.1 10*3/uL (ref 1.0–3.6)
Lymphocyte %: 34.3 %
MCHC: 33.9 g/dL (ref 32.0–36.0)
MCV: 95 fL (ref 80–100)
Monocyte #: 0.3 x10 3/mm (ref 0.2–0.9)
Monocyte %: 7.9 %
Neutrophil #: 1.7 10*3/uL (ref 1.4–6.5)
Neutrophil %: 52.9 %
RBC: 2.68 10*6/uL — ABNORMAL LOW (ref 3.80–5.20)
RDW: 20.1 % — ABNORMAL HIGH (ref 11.5–14.5)

## 2013-10-13 LAB — BASIC METABOLIC PANEL
Anion Gap: 9 (ref 7–16)
BUN: 8 mg/dL (ref 7–18)
Calcium, Total: 8.8 mg/dL (ref 8.5–10.1)
Chloride: 102 mmol/L (ref 98–107)
Co2: 25 mmol/L (ref 21–32)
EGFR (African American): 11 — ABNORMAL LOW
Potassium: 3 mmol/L — ABNORMAL LOW (ref 3.5–5.1)
Sodium: 136 mmol/L (ref 136–145)

## 2013-10-13 LAB — PHOSPHORUS: Phosphorus: 2 mg/dL — ABNORMAL LOW (ref 2.5–4.9)

## 2013-10-14 LAB — COMPREHENSIVE METABOLIC PANEL
Albumin: 2.5 g/dL — ABNORMAL LOW (ref 3.4–5.0)
Alkaline Phosphatase: 73 U/L (ref 50–136)
Anion Gap: 4 — ABNORMAL LOW (ref 7–16)
Bilirubin,Total: 0.4 mg/dL (ref 0.2–1.0)
Calcium, Total: 8.6 mg/dL (ref 8.5–10.1)
Chloride: 104 mmol/L (ref 98–107)
Co2: 31 mmol/L (ref 21–32)
Creatinine: 2.54 mg/dL — ABNORMAL HIGH (ref 0.60–1.30)
EGFR (Non-African Amer.): 18 — ABNORMAL LOW
Potassium: 3.5 mmol/L (ref 3.5–5.1)
SGOT(AST): 21 U/L (ref 15–37)
SGPT (ALT): 13 U/L (ref 12–78)
Sodium: 139 mmol/L (ref 136–145)
Total Protein: 6 g/dL — ABNORMAL LOW (ref 6.4–8.2)

## 2013-10-14 LAB — CBC WITH DIFFERENTIAL/PLATELET
Basophil #: 0 10*3/uL (ref 0.0–0.1)
Eosinophil #: 0.1 10*3/uL (ref 0.0–0.7)
Eosinophil %: 2.2 %
Lymphocyte #: 1.1 10*3/uL (ref 1.0–3.6)
Lymphocyte %: 28.4 %
MCHC: 33.9 g/dL (ref 32.0–36.0)
Monocyte %: 8.2 %
Neutrophil #: 2.2 10*3/uL (ref 1.4–6.5)
Neutrophil %: 59.9 %
Platelet: 200 10*3/uL (ref 150–440)
RDW: 20.3 % — ABNORMAL HIGH (ref 11.5–14.5)
WBC: 3.7 10*3/uL (ref 3.6–11.0)

## 2013-10-14 LAB — PHOSPHORUS: Phosphorus: 1.6 mg/dL — ABNORMAL LOW (ref 2.5–4.9)

## 2013-10-15 LAB — COMPREHENSIVE METABOLIC PANEL
Albumin: 2.7 g/dL — ABNORMAL LOW (ref 3.4–5.0)
Alkaline Phosphatase: 78 U/L (ref 50–136)
Anion Gap: 8 (ref 7–16)
BUN: 8 mg/dL (ref 7–18)
Bilirubin,Total: 0.4 mg/dL (ref 0.2–1.0)
Calcium, Total: 9.2 mg/dL (ref 8.5–10.1)
Chloride: 106 mmol/L (ref 98–107)
Creatinine: 4.49 mg/dL — ABNORMAL HIGH (ref 0.60–1.30)
EGFR (African American): 11 — ABNORMAL LOW
EGFR (Non-African Amer.): 9 — ABNORMAL LOW
Glucose: 112 mg/dL — ABNORMAL HIGH (ref 65–99)
Osmolality: 278 (ref 275–301)
Potassium: 3.8 mmol/L (ref 3.5–5.1)
Sodium: 140 mmol/L (ref 136–145)

## 2013-10-15 LAB — CBC WITH DIFFERENTIAL/PLATELET
Eosinophil #: 0.1 10*3/uL (ref 0.0–0.7)
Eosinophil %: 1.5 %
HCT: 24.9 % — ABNORMAL LOW (ref 35.0–47.0)
HGB: 8.4 g/dL — ABNORMAL LOW (ref 12.0–16.0)
Lymphocyte %: 23.7 %
MCH: 32.7 pg (ref 26.0–34.0)
Monocyte %: 6.5 %
Neutrophil #: 3.4 10*3/uL (ref 1.4–6.5)
Neutrophil %: 68.1 %
Platelet: 225 10*3/uL (ref 150–440)
RBC: 2.57 10*6/uL — ABNORMAL LOW (ref 3.80–5.20)
RDW: 20.7 % — ABNORMAL HIGH (ref 11.5–14.5)

## 2013-10-15 LAB — PHOSPHORUS: Phosphorus: 1.3 mg/dL — ABNORMAL LOW (ref 2.5–4.9)

## 2013-10-16 LAB — RENAL FUNCTION PANEL
Albumin: 2.7 g/dL — ABNORMAL LOW (ref 3.4–5.0)
Chloride: 108 mmol/L — ABNORMAL HIGH (ref 98–107)
Co2: 25 mmol/L (ref 21–32)
EGFR (Non-African Amer.): 6 — ABNORMAL LOW
Glucose: 125 mg/dL — ABNORMAL HIGH (ref 65–99)
Phosphorus: 2 mg/dL — ABNORMAL LOW (ref 2.5–4.9)
Potassium: 4 mmol/L (ref 3.5–5.1)

## 2013-10-16 LAB — CBC WITH DIFFERENTIAL/PLATELET
Basophil %: 1 %
Eosinophil %: 0.8 %
HCT: 24.8 % — ABNORMAL LOW (ref 35.0–47.0)
Lymphocyte #: 0.8 10*3/uL — ABNORMAL LOW (ref 1.0–3.6)
MCH: 33 pg (ref 26.0–34.0)
Monocyte #: 0.3 x10 3/mm (ref 0.2–0.9)
Neutrophil %: 80 %
RDW: 20.9 % — ABNORMAL HIGH (ref 11.5–14.5)
WBC: 6.3 10*3/uL (ref 3.6–11.0)

## 2013-10-16 LAB — MAGNESIUM: Magnesium: 2.1 mg/dL

## 2013-10-17 LAB — BASIC METABOLIC PANEL
BUN: 10 mg/dL (ref 7–18)
Calcium, Total: 8.6 mg/dL (ref 8.5–10.1)
Co2: 31 mmol/L (ref 21–32)
Creatinine: 3.82 mg/dL — ABNORMAL HIGH (ref 0.60–1.30)
EGFR (African American): 13 — ABNORMAL LOW
EGFR (Non-African Amer.): 11 — ABNORMAL LOW
Glucose: 105 mg/dL — ABNORMAL HIGH (ref 65–99)
Sodium: 137 mmol/L (ref 136–145)

## 2013-10-17 LAB — CBC WITH DIFFERENTIAL/PLATELET
Basophil #: 0.1 10*3/uL (ref 0.0–0.1)
Eosinophil #: 0.1 10*3/uL (ref 0.0–0.7)
Eosinophil %: 1.2 %
HCT: 24.2 % — ABNORMAL LOW (ref 35.0–47.0)
Lymphocyte #: 1.1 10*3/uL (ref 1.0–3.6)
Lymphocyte %: 20.4 %
MCH: 32.7 pg (ref 26.0–34.0)
Monocyte #: 0.4 x10 3/mm (ref 0.2–0.9)
Neutrophil %: 71 %
Platelet: 216 10*3/uL (ref 150–440)
RBC: 2.49 10*6/uL — ABNORMAL LOW (ref 3.80–5.20)
RDW: 21.9 % — ABNORMAL HIGH (ref 11.5–14.5)
WBC: 5.5 10*3/uL (ref 3.6–11.0)

## 2013-10-18 LAB — RENAL FUNCTION PANEL
Albumin: 2.5 g/dL — ABNORMAL LOW (ref 3.4–5.0)
Calcium, Total: 8.7 mg/dL (ref 8.5–10.1)
Chloride: 101 mmol/L (ref 98–107)
Co2: 29 mmol/L (ref 21–32)
Creatinine: 6.16 mg/dL — ABNORMAL HIGH (ref 0.60–1.30)
EGFR (African American): 7 — ABNORMAL LOW
EGFR (Non-African Amer.): 6 — ABNORMAL LOW
Glucose: 106 mg/dL — ABNORMAL HIGH (ref 65–99)
Osmolality: 275 (ref 275–301)
Phosphorus: 2.3 mg/dL — ABNORMAL LOW (ref 2.5–4.9)
Potassium: 4.5 mmol/L (ref 3.5–5.1)

## 2013-10-18 LAB — CLOSTRIDIUM DIFFICILE BY PCR

## 2013-10-18 LAB — CBC WITH DIFFERENTIAL/PLATELET
Basophil #: 0.1 10*3/uL (ref 0.0–0.1)
Basophil %: 0.9 %
HCT: 23.8 % — ABNORMAL LOW (ref 35.0–47.0)
HGB: 8 g/dL — ABNORMAL LOW (ref 12.0–16.0)
Lymphocyte #: 1.1 10*3/uL (ref 1.0–3.6)
MCH: 33.2 pg (ref 26.0–34.0)
MCHC: 33.8 g/dL (ref 32.0–36.0)
MCV: 98 fL (ref 80–100)
Monocyte #: 0.4 x10 3/mm (ref 0.2–0.9)
Monocyte %: 6.2 %
Neutrophil #: 4.1 10*3/uL (ref 1.4–6.5)
Neutrophil %: 72.7 %
Platelet: 211 10*3/uL (ref 150–440)
WBC: 5.7 10*3/uL (ref 3.6–11.0)

## 2013-12-07 ENCOUNTER — Ambulatory Visit: Payer: Self-pay | Admitting: Physician Assistant

## 2013-12-10 IMAGING — CR DG CHEST 1V PORT
1 series · 1 of 1 positions shown · non-contrast
Comparison: Portable exam 2742 hours compared to 09/17/2012

CLINICAL DATA: Shortness of breath, cough, history hypertension,
COPD, end-stage renal disease on dialysis

PORTABLE CHEST - 1 VIEW

[AP]
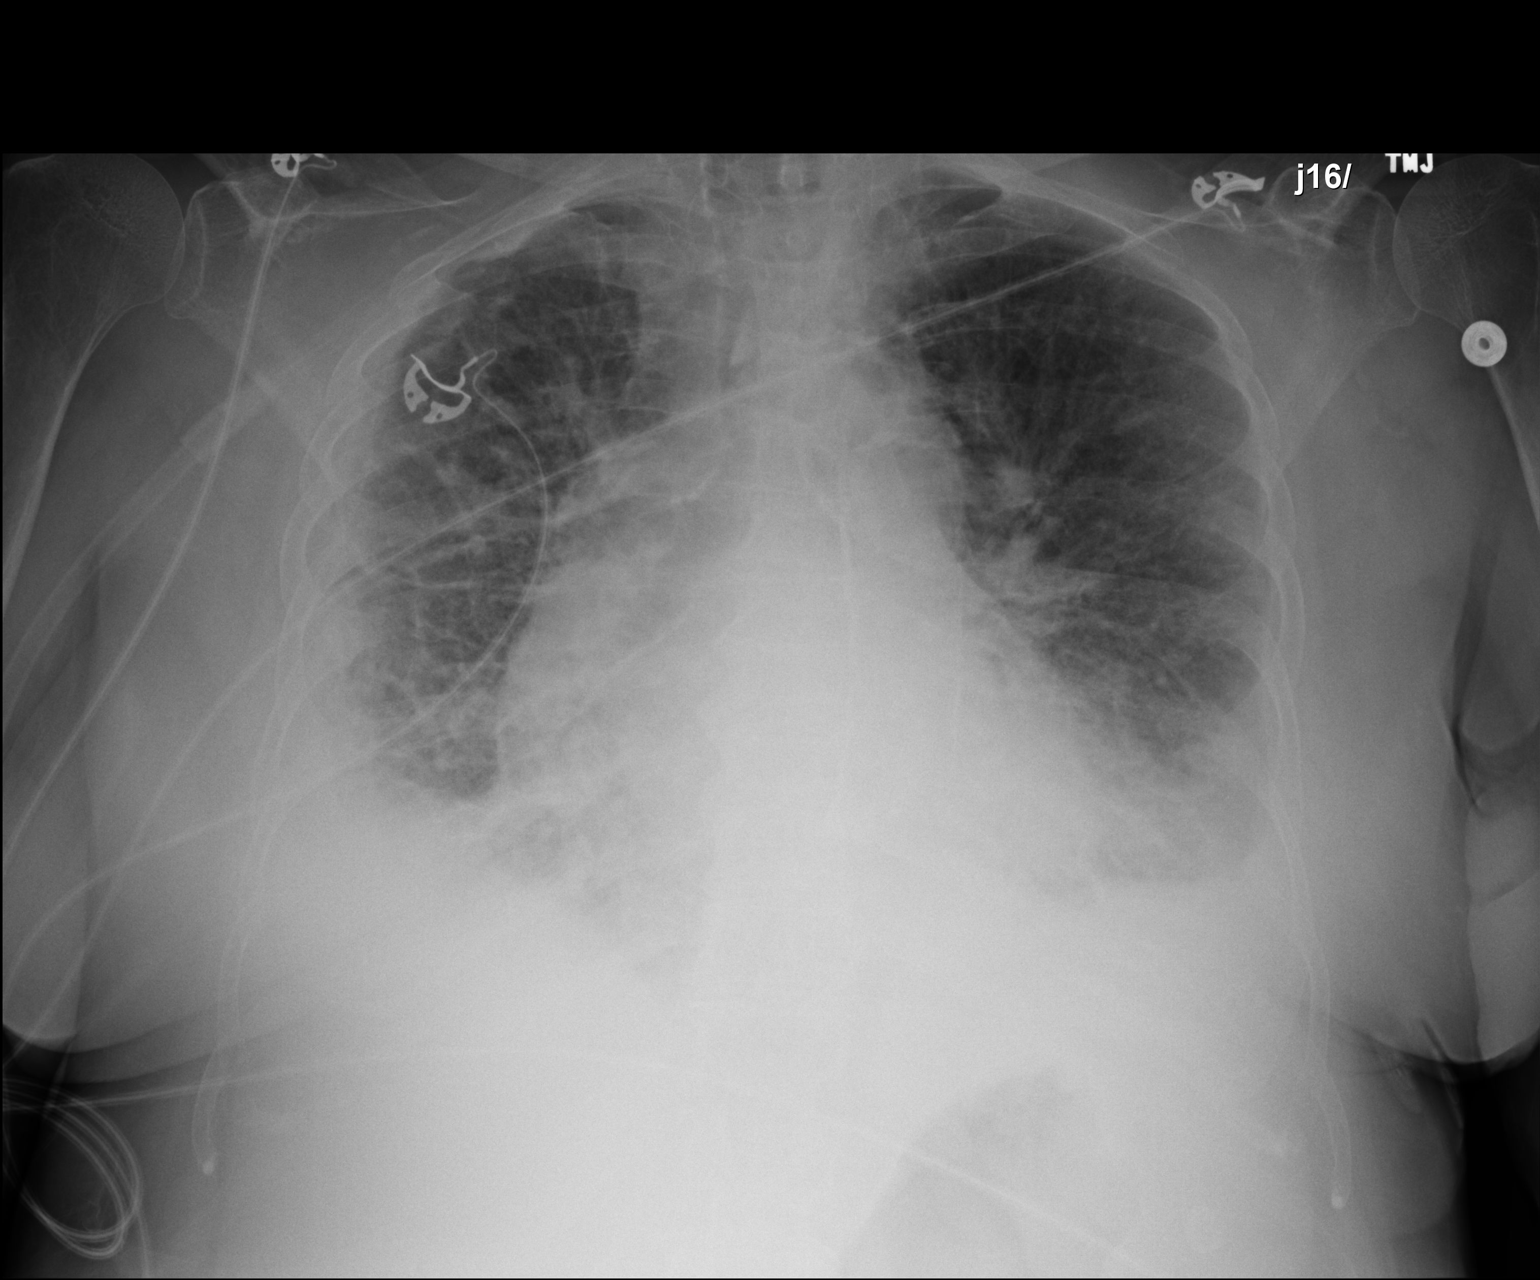

[1 of 1 positions shown; findings below may reference images not displayed]

FINDINGS: Enlargement of cardiac silhouette with pulmonary vascular
congestion.
Atherosclerotic calcification aorta.
Diffuse interstitial edema with bibasilar effusions consistent with
CHF.
Bibasilar atelectasis greater on the right.
No pneumothorax.
Bones demineralized.
IMPRESSION: CHF with bilateral pleural effusions and bibasilar atelectasis.
When compared to previous exam, right pleural effusion has
increased in size.

## 2013-12-18 IMAGING — CR DG CHEST 1V PORT
1 series · 1 of 1 positions shown · non-contrast
Comparison: none

REASON FOR EXAM: abd pain with new atrial fib
COMMENTS:

[x chest ap]
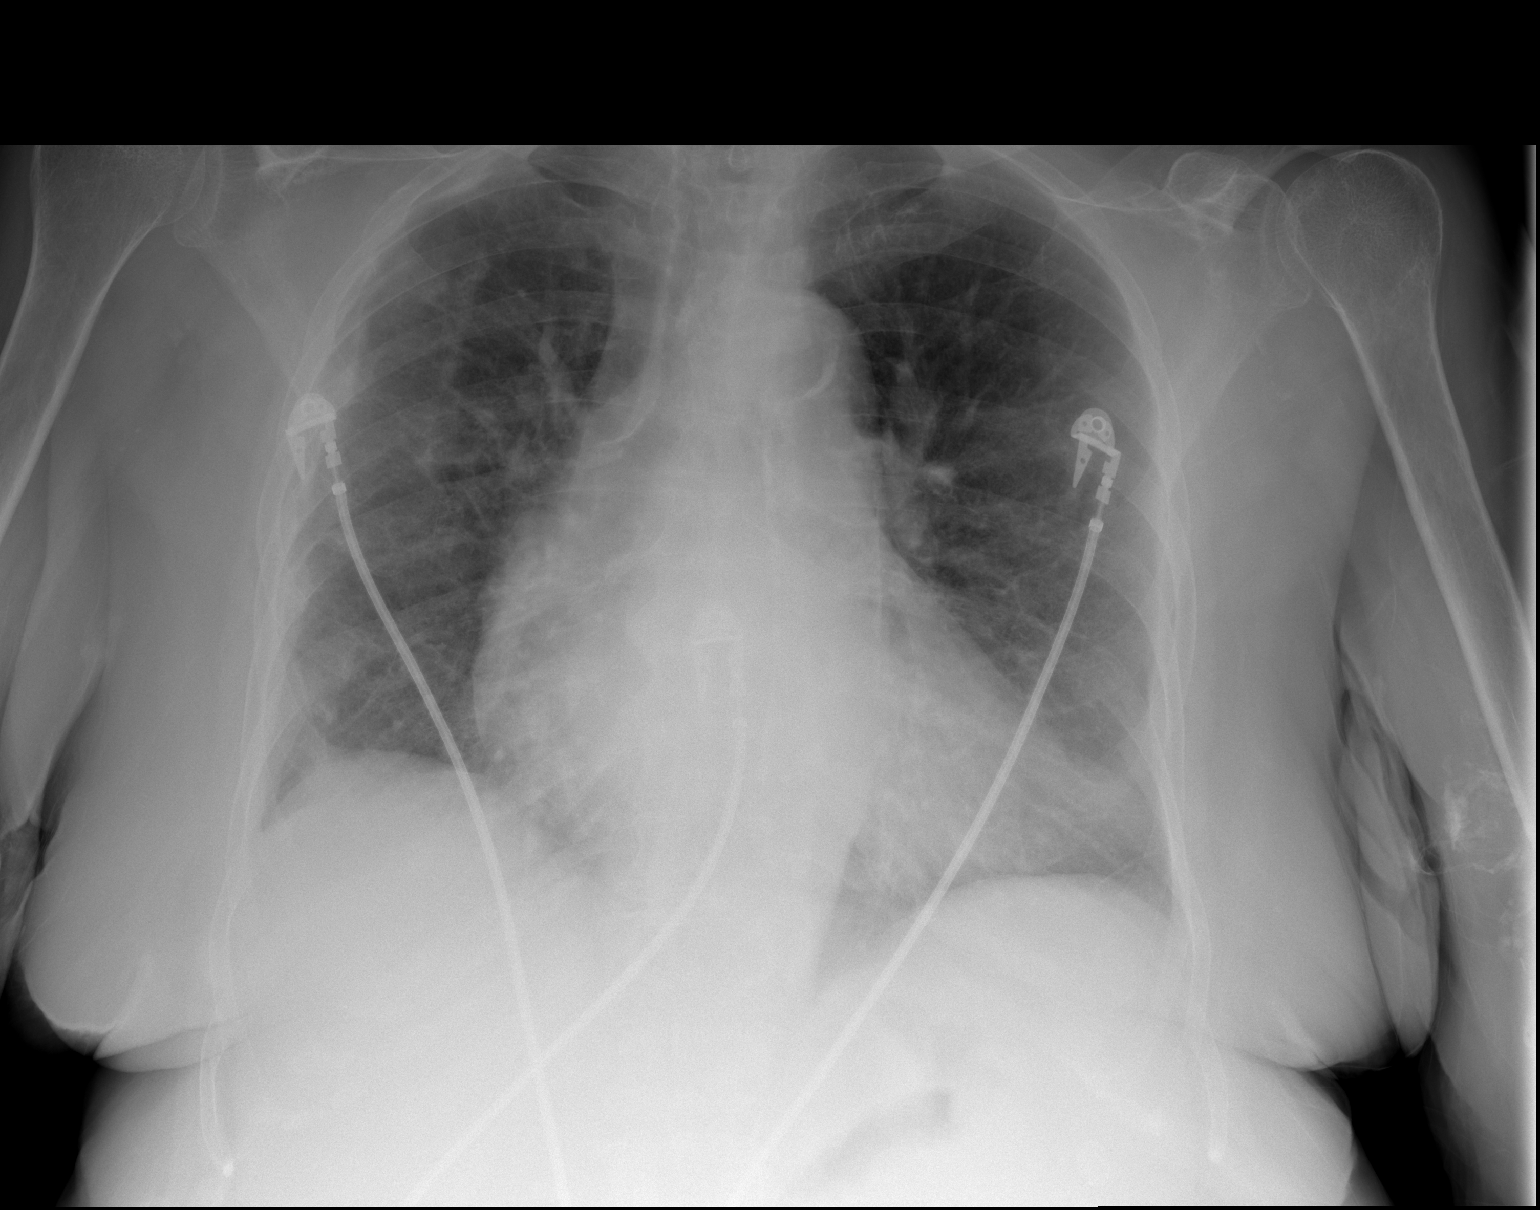

[1 of 1 positions shown; findings below may reference images not displayed]

PROCEDURE:     DXR - DXR PORTABLE CHEST SINGLE VIEW  - September 26, 2012  [DATE]

RESULT:     Comparison is made to the study March 07, 2009.

The lungs are adequately inflated. The interstitial markings are mildly
increased. The cardiac silhouette is enlarged and the central pulmonary
vascularity is prominent. There is no pleural effusion. There is mild
tortuosity of the descending thoracic aorta.
IMPRESSION: Findings suggest low-grade CHF. There is no alveolar edema
or pleural effusion.

[REDACTED]

## 2014-02-27 ENCOUNTER — Ambulatory Visit: Payer: Self-pay | Admitting: Oncology

## 2014-02-27 LAB — CBC CANCER CENTER
Basophil #: 0 x10 3/mm (ref 0.0–0.1)
Basophil %: 0.8 %
EOS PCT: 2.4 %
Eosinophil #: 0.1 x10 3/mm (ref 0.0–0.7)
HCT: 29.5 % — AB (ref 35.0–47.0)
HGB: 9.2 g/dL — AB (ref 12.0–16.0)
Lymphocyte #: 1 x10 3/mm (ref 1.0–3.6)
Lymphocyte %: 22.4 %
MCH: 30.1 pg (ref 26.0–34.0)
MCHC: 31.2 g/dL — ABNORMAL LOW (ref 32.0–36.0)
MCV: 97 fL (ref 80–100)
Monocyte #: 0.4 x10 3/mm (ref 0.2–0.9)
Monocyte %: 9 %
Neutrophil #: 2.8 x10 3/mm (ref 1.4–6.5)
Neutrophil %: 65.4 %
Platelet: 258 x10 3/mm (ref 150–440)
RBC: 3.06 10*6/uL — ABNORMAL LOW (ref 3.80–5.20)
RDW: 19.8 % — ABNORMAL HIGH (ref 11.5–14.5)
WBC: 4.3 x10 3/mm (ref 3.6–11.0)

## 2014-02-27 LAB — BASIC METABOLIC PANEL
ANION GAP: 10 (ref 7–16)
BUN: 33 mg/dL — ABNORMAL HIGH (ref 7–18)
CREATININE: 6.55 mg/dL — AB (ref 0.60–1.30)
Calcium, Total: 9.6 mg/dL (ref 8.5–10.1)
Chloride: 95 mmol/L — ABNORMAL LOW (ref 98–107)
Co2: 33 mmol/L — ABNORMAL HIGH (ref 21–32)
EGFR (African American): 7 — ABNORMAL LOW
EGFR (Non-African Amer.): 6 — ABNORMAL LOW
GLUCOSE: 114 mg/dL — AB (ref 65–99)
OSMOLALITY: 284 (ref 275–301)
Potassium: 4.6 mmol/L (ref 3.5–5.1)
Sodium: 138 mmol/L (ref 136–145)

## 2014-02-28 LAB — CEA: CEA: 3.5 ng/mL (ref 0.0–4.7)

## 2014-03-27 ENCOUNTER — Ambulatory Visit: Payer: Self-pay | Admitting: Oncology

## 2014-04-06 ENCOUNTER — Inpatient Hospital Stay: Payer: Self-pay | Admitting: Internal Medicine

## 2014-04-06 LAB — COMPREHENSIVE METABOLIC PANEL
AST: 30 U/L (ref 15–37)
Albumin: 3.3 g/dL — ABNORMAL LOW (ref 3.4–5.0)
Alkaline Phosphatase: 54 U/L
Anion Gap: 5 — ABNORMAL LOW (ref 7–16)
BILIRUBIN TOTAL: 0.3 mg/dL (ref 0.2–1.0)
BUN: 8 mg/dL (ref 7–18)
CALCIUM: 8.9 mg/dL (ref 8.5–10.1)
Chloride: 97 mmol/L — ABNORMAL LOW (ref 98–107)
Co2: 34 mmol/L — ABNORMAL HIGH (ref 21–32)
Creatinine: 3.12 mg/dL — ABNORMAL HIGH (ref 0.60–1.30)
EGFR (African American): 16 — ABNORMAL LOW
EGFR (Non-African Amer.): 14 — ABNORMAL LOW
GLUCOSE: 76 mg/dL (ref 65–99)
Osmolality: 269 (ref 275–301)
POTASSIUM: 3.5 mmol/L (ref 3.5–5.1)
SGPT (ALT): 16 U/L (ref 12–78)
SODIUM: 136 mmol/L (ref 136–145)
Total Protein: 7.4 g/dL (ref 6.4–8.2)

## 2014-04-06 LAB — CBC
HCT: 33.6 % — ABNORMAL LOW (ref 35.0–47.0)
HGB: 10.8 g/dL — ABNORMAL LOW (ref 12.0–16.0)
MCH: 30.8 pg (ref 26.0–34.0)
MCHC: 32.2 g/dL (ref 32.0–36.0)
MCV: 96 fL (ref 80–100)
Platelet: 181 10*3/uL (ref 150–440)
RBC: 3.51 10*6/uL — ABNORMAL LOW (ref 3.80–5.20)
RDW: 20.3 % — ABNORMAL HIGH (ref 11.5–14.5)
WBC: 3.9 10*3/uL (ref 3.6–11.0)

## 2014-04-07 LAB — CBC WITH DIFFERENTIAL/PLATELET
Basophil #: 0 10*3/uL (ref 0.0–0.1)
Basophil %: 0.3 %
Eosinophil #: 0 10*3/uL (ref 0.0–0.7)
Eosinophil %: 0 %
HCT: 27.9 % — ABNORMAL LOW (ref 35.0–47.0)
HGB: 8.8 g/dL — ABNORMAL LOW (ref 12.0–16.0)
LYMPHS PCT: 13.6 %
Lymphocyte #: 0.3 10*3/uL — ABNORMAL LOW (ref 1.0–3.6)
MCH: 30.2 pg (ref 26.0–34.0)
MCHC: 31.5 g/dL — AB (ref 32.0–36.0)
MCV: 96 fL (ref 80–100)
MONOS PCT: 6.2 %
Monocyte #: 0.2 x10 3/mm (ref 0.2–0.9)
NEUTROS ABS: 1.9 10*3/uL (ref 1.4–6.5)
Neutrophil %: 79.9 %
Platelet: 150 10*3/uL (ref 150–440)
RBC: 2.91 10*6/uL — ABNORMAL LOW (ref 3.80–5.20)
RDW: 20 % — ABNORMAL HIGH (ref 11.5–14.5)
WBC: 2.4 10*3/uL — AB (ref 3.6–11.0)

## 2014-04-07 LAB — BASIC METABOLIC PANEL
Anion Gap: 6 — ABNORMAL LOW (ref 7–16)
BUN: 23 mg/dL — AB (ref 7–18)
CALCIUM: 8.6 mg/dL (ref 8.5–10.1)
CHLORIDE: 95 mmol/L — AB (ref 98–107)
CO2: 33 mmol/L — AB (ref 21–32)
Creatinine: 4.87 mg/dL — ABNORMAL HIGH (ref 0.60–1.30)
GFR CALC AF AMER: 9 — AB
GFR CALC NON AF AMER: 8 — AB
Glucose: 120 mg/dL — ABNORMAL HIGH (ref 65–99)
Osmolality: 273 (ref 275–301)
POTASSIUM: 4 mmol/L (ref 3.5–5.1)
Sodium: 134 mmol/L — ABNORMAL LOW (ref 136–145)

## 2014-04-07 LAB — CLOSTRIDIUM DIFFICILE(ARMC)

## 2014-04-08 LAB — CBC WITH DIFFERENTIAL/PLATELET
Basophil #: 0 10*3/uL (ref 0.0–0.1)
Basophil %: 0.3 %
EOS ABS: 0 10*3/uL (ref 0.0–0.7)
EOS PCT: 0 %
HCT: 30.3 % — AB (ref 35.0–47.0)
HGB: 9.7 g/dL — ABNORMAL LOW (ref 12.0–16.0)
LYMPHS ABS: 0.7 10*3/uL — AB (ref 1.0–3.6)
LYMPHS PCT: 16.5 %
MCH: 30.9 pg (ref 26.0–34.0)
MCHC: 32.2 g/dL (ref 32.0–36.0)
MCV: 96 fL (ref 80–100)
Monocyte #: 0.3 x10 3/mm (ref 0.2–0.9)
Monocyte %: 7.7 %
NEUTROS PCT: 75.5 %
Neutrophil #: 3.1 10*3/uL (ref 1.4–6.5)
PLATELETS: 186 10*3/uL (ref 150–440)
RBC: 3.15 10*6/uL — AB (ref 3.80–5.20)
RDW: 19.8 % — ABNORMAL HIGH (ref 11.5–14.5)
WBC: 4.1 10*3/uL (ref 3.6–11.0)

## 2014-04-09 LAB — WBCS, STOOL

## 2014-04-09 LAB — PHOSPHORUS: PHOSPHORUS: 6.5 mg/dL — AB (ref 2.5–4.9)

## 2014-04-11 LAB — STOOL CULTURE

## 2014-05-24 ENCOUNTER — Ambulatory Visit: Payer: Self-pay | Admitting: Vascular Surgery

## 2014-06-20 ENCOUNTER — Other Ambulatory Visit: Payer: Self-pay

## 2014-06-20 LAB — HEMOGLOBIN: HGB: 8.5 g/dL — ABNORMAL LOW (ref 12.0–16.0)

## 2014-07-27 ENCOUNTER — Observation Stay: Payer: Self-pay

## 2014-07-27 LAB — COMPREHENSIVE METABOLIC PANEL
ALBUMIN: 3 g/dL — AB (ref 3.4–5.0)
ALK PHOS: 61 U/L
ALT: 13 U/L — AB
Anion Gap: 12 (ref 7–16)
BILIRUBIN TOTAL: 0.8 mg/dL (ref 0.2–1.0)
BUN: 34 mg/dL — AB (ref 7–18)
CHLORIDE: 97 mmol/L — AB (ref 98–107)
CREATININE: 7.69 mg/dL — AB (ref 0.60–1.30)
Calcium, Total: 9.6 mg/dL (ref 8.5–10.1)
Co2: 25 mmol/L (ref 21–32)
EGFR (African American): 5 — ABNORMAL LOW
EGFR (Non-African Amer.): 5 — ABNORMAL LOW
GLUCOSE: 86 mg/dL (ref 65–99)
OSMOLALITY: 275 (ref 275–301)
POTASSIUM: 4.5 mmol/L (ref 3.5–5.1)
SGOT(AST): 16 U/L (ref 15–37)
Sodium: 134 mmol/L — ABNORMAL LOW (ref 136–145)
Total Protein: 7.4 g/dL (ref 6.4–8.2)

## 2014-07-27 LAB — CBC WITH DIFFERENTIAL/PLATELET
BASOS PCT: 0.9 %
Basophil #: 0.1 10*3/uL (ref 0.0–0.1)
Eosinophil #: 0 10*3/uL (ref 0.0–0.7)
Eosinophil %: 0.1 %
HCT: 30.8 % — ABNORMAL LOW (ref 35.0–47.0)
HGB: 10.2 g/dL — ABNORMAL LOW (ref 12.0–16.0)
LYMPHS ABS: 1.4 10*3/uL (ref 1.0–3.6)
LYMPHS PCT: 9.9 %
MCH: 33.8 pg (ref 26.0–34.0)
MCHC: 33.2 g/dL (ref 32.0–36.0)
MCV: 102 fL — ABNORMAL HIGH (ref 80–100)
Monocyte #: 0.8 x10 3/mm (ref 0.2–0.9)
Monocyte %: 5.5 %
Neutrophil #: 11.6 10*3/uL — ABNORMAL HIGH (ref 1.4–6.5)
Neutrophil %: 83.6 %
Platelet: 261 10*3/uL (ref 150–440)
RBC: 3.03 10*6/uL — ABNORMAL LOW (ref 3.80–5.20)
RDW: 18.2 % — ABNORMAL HIGH (ref 11.5–14.5)
WBC: 13.9 10*3/uL — ABNORMAL HIGH (ref 3.6–11.0)

## 2014-07-27 LAB — CLOSTRIDIUM DIFFICILE(ARMC)

## 2014-07-27 LAB — LIPASE, BLOOD: LIPASE: 138 U/L (ref 73–393)

## 2014-07-28 LAB — CBC WITH DIFFERENTIAL/PLATELET
Basophil #: 0 10*3/uL (ref 0.0–0.1)
Basophil %: 0.1 %
Eosinophil #: 0 10*3/uL (ref 0.0–0.7)
Eosinophil %: 0 %
HCT: 27.2 % — AB (ref 35.0–47.0)
HGB: 9.2 g/dL — ABNORMAL LOW (ref 12.0–16.0)
LYMPHS ABS: 0.7 10*3/uL — AB (ref 1.0–3.6)
Lymphocyte %: 7.7 %
MCH: 34.3 pg — ABNORMAL HIGH (ref 26.0–34.0)
MCHC: 33.8 g/dL (ref 32.0–36.0)
MCV: 102 fL — AB (ref 80–100)
MONO ABS: 0.2 x10 3/mm (ref 0.2–0.9)
MONOS PCT: 2.4 %
NEUTROS PCT: 89.8 %
Neutrophil #: 8.2 10*3/uL — ABNORMAL HIGH (ref 1.4–6.5)
Platelet: 239 10*3/uL (ref 150–440)
RBC: 2.68 10*6/uL — AB (ref 3.80–5.20)
RDW: 17.8 % — ABNORMAL HIGH (ref 11.5–14.5)
WBC: 9.1 10*3/uL (ref 3.6–11.0)

## 2014-07-28 LAB — BASIC METABOLIC PANEL
Anion Gap: 15 (ref 7–16)
BUN: 52 mg/dL — AB (ref 7–18)
Calcium, Total: 9.7 mg/dL (ref 8.5–10.1)
Chloride: 96 mmol/L — ABNORMAL LOW (ref 98–107)
Co2: 23 mmol/L (ref 21–32)
Creatinine: 9.51 mg/dL — ABNORMAL HIGH (ref 0.60–1.30)
EGFR (African American): 4 — ABNORMAL LOW
GFR CALC NON AF AMER: 4 — AB
Glucose: 124 mg/dL — ABNORMAL HIGH (ref 65–99)
OSMOLALITY: 284 (ref 275–301)
Potassium: 5.2 mmol/L — ABNORMAL HIGH (ref 3.5–5.1)
SODIUM: 134 mmol/L — AB (ref 136–145)

## 2014-11-13 ENCOUNTER — Ambulatory Visit: Payer: Self-pay | Admitting: Nephrology

## 2014-12-05 ENCOUNTER — Encounter (HOSPITAL_COMMUNITY): Payer: Self-pay | Admitting: Vascular Surgery

## 2014-12-23 LAB — BASIC METABOLIC PANEL
ANION GAP: 8 (ref 7–16)
BUN: 16 mg/dL (ref 7–18)
Calcium, Total: 8.8 mg/dL (ref 8.5–10.1)
Chloride: 99 mmol/L (ref 98–107)
Co2: 31 mmol/L (ref 21–32)
Creatinine: 5.63 mg/dL — ABNORMAL HIGH (ref 0.60–1.30)
GFR CALC AF AMER: 10 — AB
GFR CALC NON AF AMER: 8 — AB
Glucose: 93 mg/dL (ref 65–99)
Osmolality: 277 (ref 275–301)
POTASSIUM: 3.9 mmol/L (ref 3.5–5.1)
Sodium: 138 mmol/L (ref 136–145)

## 2014-12-23 LAB — CBC
HCT: 34.3 % — ABNORMAL LOW (ref 35.0–47.0)
HGB: 10.9 g/dL — AB (ref 12.0–16.0)
MCH: 31.7 pg (ref 26.0–34.0)
MCHC: 31.7 g/dL — ABNORMAL LOW (ref 32.0–36.0)
MCV: 100 fL (ref 80–100)
PLATELETS: 133 10*3/uL — AB (ref 150–440)
RBC: 3.43 10*6/uL — ABNORMAL LOW (ref 3.80–5.20)
RDW: 19.1 % — AB (ref 11.5–14.5)
WBC: 3.1 10*3/uL — ABNORMAL LOW (ref 3.6–11.0)

## 2014-12-23 LAB — PRO B NATRIURETIC PEPTIDE: B-TYPE NATIURETIC PEPTID: 120191 pg/mL — AB (ref 0–125)

## 2014-12-23 LAB — TROPONIN I: Troponin-I: 0.02 ng/mL

## 2014-12-24 ENCOUNTER — Inpatient Hospital Stay: Payer: Self-pay | Admitting: Internal Medicine

## 2014-12-24 LAB — CLOSTRIDIUM DIFFICILE(ARMC)

## 2014-12-25 LAB — BASIC METABOLIC PANEL
Anion Gap: 8 (ref 7–16)
BUN: 25 mg/dL — ABNORMAL HIGH (ref 7–18)
CALCIUM: 8.2 mg/dL — AB (ref 8.5–10.1)
CREATININE: 7.3 mg/dL — AB (ref 0.60–1.30)
Chloride: 100 mmol/L (ref 98–107)
Co2: 30 mmol/L (ref 21–32)
EGFR (African American): 7 — ABNORMAL LOW
EGFR (Non-African Amer.): 6 — ABNORMAL LOW
Glucose: 81 mg/dL (ref 65–99)
Osmolality: 279 (ref 275–301)
Potassium: 4.3 mmol/L (ref 3.5–5.1)
SODIUM: 138 mmol/L (ref 136–145)

## 2014-12-25 LAB — CBC WITH DIFFERENTIAL/PLATELET
Basophil #: 0.1 10*3/uL (ref 0.0–0.1)
Basophil %: 2.9 %
EOS PCT: 3.6 %
Eosinophil #: 0.1 10*3/uL (ref 0.0–0.7)
HCT: 32.6 % — AB (ref 35.0–47.0)
HGB: 10.3 g/dL — AB (ref 12.0–16.0)
LYMPHS PCT: 21 %
Lymphocyte #: 0.6 10*3/uL — ABNORMAL LOW (ref 1.0–3.6)
MCH: 31.6 pg (ref 26.0–34.0)
MCHC: 31.7 g/dL — AB (ref 32.0–36.0)
MCV: 100 fL (ref 80–100)
MONO ABS: 0.2 x10 3/mm (ref 0.2–0.9)
Monocyte %: 5.9 %
NEUTROS ABS: 1.8 10*3/uL (ref 1.4–6.5)
Neutrophil %: 66.6 %
Platelet: 111 10*3/uL — ABNORMAL LOW (ref 150–440)
RBC: 3.27 10*6/uL — ABNORMAL LOW (ref 3.80–5.20)
RDW: 18.6 % — ABNORMAL HIGH (ref 11.5–14.5)
WBC: 2.6 10*3/uL — ABNORMAL LOW (ref 3.6–11.0)

## 2014-12-25 LAB — PHOSPHORUS: Phosphorus: 5.6 mg/dL — ABNORMAL HIGH (ref 2.5–4.9)

## 2014-12-26 LAB — BASIC METABOLIC PANEL
Anion Gap: 6 — ABNORMAL LOW (ref 7–16)
BUN: 16 mg/dL (ref 7–18)
CREATININE: 5.08 mg/dL — AB (ref 0.60–1.30)
Calcium, Total: 8.6 mg/dL (ref 8.5–10.1)
Chloride: 98 mmol/L (ref 98–107)
Co2: 33 mmol/L — ABNORMAL HIGH (ref 21–32)
EGFR (African American): 11 — ABNORMAL LOW
EGFR (Non-African Amer.): 9 — ABNORMAL LOW
Glucose: 88 mg/dL (ref 65–99)
OSMOLALITY: 274 (ref 275–301)
POTASSIUM: 4.1 mmol/L (ref 3.5–5.1)
SODIUM: 137 mmol/L (ref 136–145)

## 2014-12-26 LAB — CBC WITH DIFFERENTIAL/PLATELET
BASOS PCT: 3.8 %
Basophil #: 0.1 10*3/uL (ref 0.0–0.1)
Eosinophil #: 0.1 10*3/uL (ref 0.0–0.7)
Eosinophil %: 2.5 %
HCT: 35.6 % (ref 35.0–47.0)
HGB: 11.2 g/dL — AB (ref 12.0–16.0)
LYMPHS PCT: 21.5 %
Lymphocyte #: 0.6 10*3/uL — ABNORMAL LOW (ref 1.0–3.6)
MCH: 31.4 pg (ref 26.0–34.0)
MCHC: 31.4 g/dL — ABNORMAL LOW (ref 32.0–36.0)
MCV: 100 fL (ref 80–100)
Monocyte #: 0.1 x10 3/mm — ABNORMAL LOW (ref 0.2–0.9)
Monocyte %: 4.6 %
Neutrophil #: 2 10*3/uL (ref 1.4–6.5)
Neutrophil %: 67.6 %
Platelet: 120 10*3/uL — ABNORMAL LOW (ref 150–440)
RBC: 3.56 10*6/uL — ABNORMAL LOW (ref 3.80–5.20)
RDW: 18.7 % — AB (ref 11.5–14.5)
WBC: 2.9 10*3/uL — ABNORMAL LOW (ref 3.6–11.0)

## 2014-12-26 LAB — PHOSPHORUS: Phosphorus: 4.3 mg/dL (ref 2.5–4.9)

## 2014-12-27 ENCOUNTER — Inpatient Hospital Stay: Payer: Self-pay | Admitting: Internal Medicine

## 2014-12-27 LAB — COMPREHENSIVE METABOLIC PANEL
ALBUMIN: 2.6 g/dL — AB (ref 3.4–5.0)
ALK PHOS: 46 U/L
AST: 10 U/L — AB (ref 15–37)
Anion Gap: 14 (ref 7–16)
BUN: 1 mg/dL — ABNORMAL LOW (ref 7–18)
Bilirubin,Total: 0.6 mg/dL (ref 0.2–1.0)
Calcium, Total: 8.7 mg/dL (ref 8.5–10.1)
Chloride: 100 mmol/L (ref 98–107)
Co2: 29 mmol/L (ref 21–32)
Creatinine: 5.23 mg/dL — ABNORMAL HIGH (ref 0.60–1.30)
EGFR (African American): 10 — ABNORMAL LOW
GFR CALC NON AF AMER: 9 — AB
GLUCOSE: 98 mg/dL (ref 65–99)
OSMOLALITY: 281 (ref 275–301)
Potassium: 4.2 mmol/L (ref 3.5–5.1)
SGPT (ALT): 11 U/L — ABNORMAL LOW
SODIUM: 143 mmol/L (ref 136–145)
Total Protein: 5.7 g/dL — ABNORMAL LOW (ref 6.4–8.2)

## 2014-12-27 LAB — CBC WITH DIFFERENTIAL/PLATELET
BASOS ABS: 0.1 10*3/uL (ref 0.0–0.1)
Basophil %: 1.8 %
EOS ABS: 0 10*3/uL (ref 0.0–0.7)
EOS PCT: 0.3 %
HCT: 21.7 % — ABNORMAL LOW (ref 35.0–47.0)
HGB: 7 g/dL — ABNORMAL LOW (ref 12.0–16.0)
LYMPHS PCT: 22.3 %
Lymphocyte #: 1.1 10*3/uL (ref 1.0–3.6)
MCH: 32 pg (ref 26.0–34.0)
MCHC: 32.1 g/dL (ref 32.0–36.0)
MCV: 100 fL (ref 80–100)
MONOS PCT: 8.7 %
Monocyte #: 0.4 x10 3/mm (ref 0.2–0.9)
NEUTROS ABS: 3.2 10*3/uL (ref 1.4–6.5)
Neutrophil %: 66.9 %
PLATELETS: 150 10*3/uL (ref 150–440)
RBC: 2.18 10*6/uL — AB (ref 3.80–5.20)
RDW: 18.2 % — ABNORMAL HIGH (ref 11.5–14.5)
WBC: 4.8 10*3/uL (ref 3.6–11.0)

## 2014-12-27 LAB — LIPASE, BLOOD: Lipase: 197 U/L (ref 73–393)

## 2014-12-27 LAB — TROPONIN I: Troponin-I: 0.03 ng/mL

## 2014-12-28 LAB — CBC WITH DIFFERENTIAL/PLATELET
BASOS PCT: 1.4 %
Basophil #: 0.1 10*3/uL (ref 0.0–0.1)
EOS PCT: 0.7 %
Eosinophil #: 0 10*3/uL (ref 0.0–0.7)
HCT: 24.5 % — ABNORMAL LOW (ref 35.0–47.0)
HGB: 8.2 g/dL — AB (ref 12.0–16.0)
LYMPHS ABS: 0.6 10*3/uL — AB (ref 1.0–3.6)
Lymphocyte %: 14.8 %
MCH: 31.1 pg (ref 26.0–34.0)
MCHC: 33.6 g/dL (ref 32.0–36.0)
MCV: 93 fL (ref 80–100)
MONO ABS: 0.2 x10 3/mm (ref 0.2–0.9)
Monocyte %: 4.4 %
Neutrophil #: 3.4 10*3/uL (ref 1.4–6.5)
Neutrophil %: 78.7 %
Platelet: 114 10*3/uL — ABNORMAL LOW (ref 150–440)
RBC: 2.64 10*6/uL — AB (ref 3.80–5.20)
RDW: 18.6 % — ABNORMAL HIGH (ref 11.5–14.5)
WBC: 4.3 10*3/uL (ref 3.6–11.0)

## 2014-12-28 LAB — BASIC METABOLIC PANEL
Anion Gap: 9 (ref 7–16)
BUN: 134 mg/dL — ABNORMAL HIGH (ref 7–18)
CALCIUM: 8.5 mg/dL (ref 8.5–10.1)
CO2: 32 mmol/L (ref 21–32)
Chloride: 101 mmol/L (ref 98–107)
Creatinine: 5.84 mg/dL — ABNORMAL HIGH (ref 0.60–1.30)
EGFR (African American): 9 — ABNORMAL LOW
EGFR (Non-African Amer.): 8 — ABNORMAL LOW
GLUCOSE: 84 mg/dL (ref 65–99)
OSMOLALITY: 326 (ref 275–301)
Potassium: 4.1 mmol/L (ref 3.5–5.1)
SODIUM: 142 mmol/L (ref 136–145)

## 2014-12-28 LAB — HEMOGLOBIN: HGB: 7.8 g/dL — AB (ref 12.0–16.0)

## 2014-12-29 LAB — CBC WITH DIFFERENTIAL/PLATELET
BASOS PCT: 0.9 %
Basophil #: 0 10*3/uL (ref 0.0–0.1)
Eosinophil #: 0.1 10*3/uL (ref 0.0–0.7)
Eosinophil %: 1.5 %
HCT: 22.3 % — AB (ref 35.0–47.0)
HGB: 7.4 g/dL — AB (ref 12.0–16.0)
LYMPHS PCT: 14 %
Lymphocyte #: 0.5 10*3/uL — ABNORMAL LOW (ref 1.0–3.6)
MCH: 31.1 pg (ref 26.0–34.0)
MCHC: 33.2 g/dL (ref 32.0–36.0)
MCV: 94 fL (ref 80–100)
MONOS PCT: 5.8 %
Monocyte #: 0.2 x10 3/mm (ref 0.2–0.9)
NEUTROS ABS: 3.1 10*3/uL (ref 1.4–6.5)
Neutrophil %: 77.8 %
Platelet: 103 10*3/uL — ABNORMAL LOW (ref 150–440)
RBC: 2.39 10*6/uL — ABNORMAL LOW (ref 3.80–5.20)
RDW: 18.4 % — ABNORMAL HIGH (ref 11.5–14.5)
WBC: 3.9 10*3/uL (ref 3.6–11.0)

## 2014-12-29 LAB — BASIC METABOLIC PANEL
Anion Gap: 12 (ref 7–16)
BUN: 136 mg/dL — ABNORMAL HIGH (ref 7–18)
CHLORIDE: 100 mmol/L (ref 98–107)
CREATININE: 7.92 mg/dL — AB (ref 0.60–1.30)
Calcium, Total: 9.5 mg/dL (ref 8.5–10.1)
Co2: 29 mmol/L (ref 21–32)
EGFR (African American): 6 — ABNORMAL LOW
EGFR (Non-African Amer.): 5 — ABNORMAL LOW
Glucose: 100 mg/dL — ABNORMAL HIGH (ref 65–99)
Osmolality: 325 (ref 275–301)
Potassium: 4.3 mmol/L (ref 3.5–5.1)
Sodium: 141 mmol/L (ref 136–145)

## 2014-12-29 LAB — HEMOGLOBIN: HGB: 7.8 g/dL — AB (ref 12.0–16.0)

## 2014-12-30 LAB — CBC WITH DIFFERENTIAL/PLATELET
Basophil #: 0 10*3/uL (ref 0.0–0.1)
Basophil %: 0.8 %
EOS ABS: 0.1 10*3/uL (ref 0.0–0.7)
Eosinophil %: 3.1 %
HCT: 22.7 % — ABNORMAL LOW (ref 35.0–47.0)
HGB: 7.4 g/dL — ABNORMAL LOW (ref 12.0–16.0)
Lymphocyte #: 0.4 10*3/uL — ABNORMAL LOW (ref 1.0–3.6)
Lymphocyte %: 14.1 %
MCH: 31.1 pg (ref 26.0–34.0)
MCHC: 32.5 g/dL (ref 32.0–36.0)
MCV: 96 fL (ref 80–100)
Monocyte #: 0.2 x10 3/mm (ref 0.2–0.9)
Monocyte %: 7.7 %
NEUTROS ABS: 2.1 10*3/uL (ref 1.4–6.5)
Neutrophil %: 74.3 %
Platelet: 103 10*3/uL — ABNORMAL LOW (ref 150–440)
RBC: 2.38 10*6/uL — AB (ref 3.80–5.20)
RDW: 18.2 % — ABNORMAL HIGH (ref 11.5–14.5)
WBC: 2.8 10*3/uL — AB (ref 3.6–11.0)

## 2014-12-31 LAB — CBC WITH DIFFERENTIAL/PLATELET
BASOS ABS: 0 10*3/uL (ref 0.0–0.1)
Basophil %: 1 %
EOS PCT: 3.7 %
Eosinophil #: 0.1 10*3/uL (ref 0.0–0.7)
HCT: 23.2 % — ABNORMAL LOW (ref 35.0–47.0)
HGB: 7.5 g/dL — ABNORMAL LOW (ref 12.0–16.0)
LYMPHS PCT: 13.4 %
Lymphocyte #: 0.5 10*3/uL — ABNORMAL LOW (ref 1.0–3.6)
MCH: 31.2 pg (ref 26.0–34.0)
MCHC: 32.3 g/dL (ref 32.0–36.0)
MCV: 96 fL (ref 80–100)
MONOS PCT: 4.4 %
Monocyte #: 0.2 x10 3/mm (ref 0.2–0.9)
Neutrophil #: 2.9 10*3/uL (ref 1.4–6.5)
Neutrophil %: 77.5 %
PLATELETS: 117 10*3/uL — AB (ref 150–440)
RBC: 2.4 10*6/uL — ABNORMAL LOW (ref 3.80–5.20)
RDW: 17.7 % — AB (ref 11.5–14.5)
WBC: 3.7 10*3/uL (ref 3.6–11.0)

## 2014-12-31 LAB — RENAL FUNCTION PANEL
ANION GAP: 13 (ref 7–16)
Albumin: 2.9 g/dL — ABNORMAL LOW (ref 3.4–5.0)
BUN: 133 mg/dL — ABNORMAL HIGH (ref 7–18)
Calcium, Total: 8.9 mg/dL (ref 8.5–10.1)
Chloride: 99 mmol/L (ref 98–107)
Co2: 25 mmol/L (ref 21–32)
Creatinine: 10.56 mg/dL — ABNORMAL HIGH (ref 0.60–1.30)
EGFR (African American): 5 — ABNORMAL LOW
EGFR (Non-African Amer.): 4 — ABNORMAL LOW
GLUCOSE: 108 mg/dL — AB (ref 65–99)
OSMOLALITY: 317 (ref 275–301)
Phosphorus: 4.8 mg/dL (ref 2.5–4.9)
Potassium: 4.5 mmol/L (ref 3.5–5.1)
SODIUM: 137 mmol/L (ref 136–145)

## 2015-01-09 ENCOUNTER — Encounter (HOSPITAL_COMMUNITY): Payer: Self-pay | Admitting: Vascular Surgery

## 2015-02-14 ENCOUNTER — Ambulatory Visit: Payer: Self-pay | Admitting: Gastroenterology

## 2015-02-22 ENCOUNTER — Emergency Department: Payer: Self-pay | Admitting: Internal Medicine

## 2015-04-15 NOTE — Consult Note (Signed)
Events of last 24 hrs noted. Pt seen. I was at [facility] in [facility] and so not available to see her yest. Other than feeling sore, looks stable postop. No flatus yet. Pt stayed in recovery long period of time post colonoscopy on Monday. Given pain meds.X-ray at the time was unrevealing. Pain had subsided and abd became less distended as time went on. Abdomen remained soft with no rebound or guarding throughout post colonoscopy. Was told to return to ER later that day for CT if pain persisted. Instead, patient went to ER after dialysis yest. It is surprising that site of perforation in mid transverse colon. Had greast difficulty navigating sigmoid colon due to multiple tics and probable adhesions. Had to switch to pedriatric scope to complete the procedure. Once sigmoid area navigated, rest of colon was easy to navigate. Medium size polyp removed from ileocecal valve and more biopsies done with more tattooing done at previous malignant polyp site near splenic flexure. Path from Monday still pending. Will ask Dr. Leanora Cover if perforation site near tatooed area. Will follow. Thanks.  Electronic Signatures: Verdie Shire (MD)  (Signed on 11-Dec-13 08:06)  Authored  Last Updated: 11-Dec-13 08:06 by Verdie Shire (MD)

## 2015-04-15 NOTE — Op Note (Signed)
PATIENT NAME:  [name], [name] MR#:  [id number] DATE OF BIRTH:  Aug 16, 1941  DATE OF PROCEDURE:  12/05/2012  OPERATION PERFORMED:  1. Exploratory laparotomy.  2. Enterolysis.  3. Repair of colon perforation.   SURGEON: Consuela Mimes, MD   ANESTHESIA: General.   PREOPERATIVE DIAGNOSIS: Perforation.   POSTOPERATIVE DIAGNOSIS: Perforation, proximal to mid transverse.   PROCEDURE IN DETAIL: The patient was placed supine on the operating room table and prepped and draped in the usual sterile fashion. A limited midline incision was made around the umbilicus and this was carried down through the linea alba and the peritoneum was entered carefully. Adhesions from the omentum to the anterior abdominal wall and the pelvic structures were taken down with electrocautery and then as much of the colonic serosa as could be visualized was visualized. First, the sigmoid colon was looked at and there was no evidence of soilage or perforation there. There were a few serosal petechiae. There was not inflammation in teh pelvis and there was no eveidence of a colovesical fistula. Then the area of the splenic flexure was looked at and there was no area of soilage or perforation there. The tattooing was actually in the mid transverse colon and extensive dissection within the pericolonic fat was undertaken to expose the entire serosal surface that had been tattooed and there was no evidence of perforation there. The area of colon wall was a little thick but there was no mass present. The ileocecal valve area was inspected and there was some inflammation in the area of that polypectomy site but there was no perforation and no soilage there. There was a little bit of colonic wall thickening near the area of the presumed polypectomy site but this again was viable and it was not thought to cause any additional problems. I did not believe that I had found the colonic perforation site since there was no soilage anywhere. I  irrigated the main cavity with a large amount of warm normal saline and then draped the omentum over top of it and then was irrigating the omentum when I saw a tiny opening in the pericolonic fat in the proximal to mid transverse colon (10 to 15 cm proximal to the area of the tattooing). I dissected the pericolonic fat off of this area and found a perforation that was about 8 mm in diameter. The walls of the colon there appeared viable and, therefore, I closed it in a transverse orientation with two layers of interrupted 3-0 silk seromuscular Lembert sutures. I then folded some of the proximal omentum upside down and over top of the area of closure and secured that in place with multiple horizontal mattress sutures of 3-0 silk. I then re-irrigated the area and closed the linea alba with a running #1 PDS suture and irrigated the subcutaneous tissue and closed the skin with skin stapling device and applied a sterile dressing. The patient tolerated the procedure well. There were no complications.   ____________________________ Consuela Mimes, MD wfm:drc D: 12/05/2012 19:36:29 ET T: 12/06/2012 08:56:13 ET JOB#: [id number]  cc: Consuela Mimes, MD, <Dictator> Consuela Mimes MD ELECTRONICALLY SIGNED 12/06/2012 14:28

## 2015-04-15 NOTE — Consult Note (Signed)
Brief Consult Note: Diagnosis: Hypoxia.   Patient was seen by consultant.   Consult note dictated.   Orders entered.   Comments: 74 yo female with hypoxemia and dyspnea.  pt is a HD pt and has some s/sof fluid overload but over all has significant atelectasis after her surgical procedure. pt has a A-a O2 ratio >400 ( showing a vent-perfusion missmatch.) will get a CT Iv contrast ( pt HD ) to evaluate the possibility of PE.  Electronic Signatures: James Ivanoff, Roselie Awkward (MD)  (Signed 12-Dec-13 15:12)  Authored: Brief Consult Note   Last Updated: 12-Dec-13 15:12 by Inavale Sink (MD)

## 2015-04-15 NOTE — H&P (Signed)
PATIENT NAME:  [name], [name] MR#:  [id number] DATE OF BIRTH:  1941/01/15  DATE OF ADMISSION:  12/05/2012  HISTORY OF PRESENT ILLNESS: Ms. 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 She subsequently underwent colonoscopy and was found to have a polyp at the splenic flexure which returned stage 0 cancer. Therefore, she had a second colonoscopy yesterday in order to obtain additional biopsies of that area and when that was performed she had the splenic flexure tattooed. There was no significant residual disease in the splenic flexure area, but Dr. Candace Cruise had difficulty negotiating a tortuous sigmoid colon due to its multiple diverticula and tortuosity and had switched to the pediatric colonoscope. In doing so he also found a polyp at the ileocecal valve, which had not been seen during the previous colonoscopy and a polypectomy of that area was obtained as well. The patient had some abdominal pain immediately following the colonoscopy yesterday but this subsided. Then this morning when she arose she had a recurrence of her abdominal pain, but she proceeded to her regular hemodialysis session and when that was completed she sought medical attention in the Emergency Department. Here in the Emergency Department she was found to have pneumoperitoneum.   PAST MEDICAL HISTORY:  1. History of acute sigmoid diverticulitis.  2. 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  3. Endstage renal disease on hemodialysis.  4. Anemia of endstage renal disease.  5. Hypertension.  6. Congestive heart failure.  7. Chronic obstructive pulmonary disease.  8. Hypothyroidism.  9. Status post hysterectomy.  10. History of right pneumothorax.  11. Cataracts.   ALLERGIES: Shellfish.   MEDICATIONS: Discharge medications as of 09/28/2012:  1. Metoprolol 25 mg daily.  2. Atarax 25 mg every eight hours p.r.n.  3. Benadryl 25 mg t.i.d. p.r.n.   4. PhosLo Gelcaps 667 mg t.i.d.  5. Advair Diskus 100/50, 1 puff b.i.d.  6. Isosorbide mononitrate 60 mg daily.  7. Sensipar 30 daily.  8. Nephro-Vite one daily.  9. Synthroid 125 mcg daily.  10. Combivent 2 puffs inhaled q. 6 h.  11. Amlodipine 10 mg daily.   SOCIAL HISTORY: The patient lives with her husband and quit smoking 20 years ago. She denies using alcohol or illicit drugs.   FAMILY HISTORY: Noncontributory.   REVIEW OF SYSTEMS: Negative for 10 systems except as mentioned in the history of present illness above. The patient's last meal was this morning at breakfast prior to going to hemodialysis. She did not have any nausea or vomiting. She has not experienced any rectal bleeding either.   PHYSICAL EXAMINATION:  GENERAL: Elderly female lying on the Emergency Department stretcher in no apparent distress.   VITAL SIGNS: Height 5 feet 4 inches, weight 168 pounds, BMI 28.9. Temperature 97.8, pulse 81, respirations 18, blood pressure 116/72 and oxygen saturation 98% on room air at rest.   HEENT: Pupils equally round and reactive to light. Extraocular movements intact. Sclerae anicteric. Oropharynx clear. Mucous membranes moist. Hearing intact to voice.   NECK: No tracheal deviation or jugular venous distention.   HEART: Regular rate and rhythm with no murmurs or rubs.   LUNGS: Clear to auscultation with normal respiratory effort bilaterally.   ABDOMEN: Obese, distended, slightly protuberant, slightly tympanitic and tender in all four quadrants.   EXTREMITIES: Mild edema with normal capillary refill bilaterally.   NEUROLOGIC: Cranial nerves II through XII, motor and sensation grossly intact.   PSYCHIATRIC: Alert  and oriented times four. Appropriate affect.   LABORATORY, DIAGNOSTIC, AND RADIOLOGICAL DATA: Electrolytes essentially normal with the exception of creatinine of 4.1. Calcium corrected, normal. TSH 4.8. White blood cell count 6.7, hemoglobin 11.4, hematocrit 34%,  platelet count 198,000. Portable chest x-ray reveals pneumoperitoneum as does CT scan of the abdomen and pelvis, which I reviewed but for which official report has not been dictated or transcribed.   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 The patient has multiple medical comorbidities, but requires emergent exploratory laparotomy. She understands that she may need a colostomy and accepts the risks and is interested in proceeding. I will treat her with IV antibiotics and hopefully because of her colonoscopy prep, intraperitoneal soilage will be minimal.     ____________________________ Consuela Mimes, MD wfm:bjt D: 12/05/2012 14:59:22 ET T: 12/05/2012 15:20:04 ET JOB#: [id number]  cc: Consuela Mimes, MD, <Dictator> Consuela Mimes MD ELECTRONICALLY SIGNED 12/06/2012 7:58

## 2015-04-15 NOTE — Consult Note (Signed)
PATIENT NAME:  MARIEA, MCMARTIN MR#:  [id number] DATE OF BIRTH:  03/15/1941  DATE OF CONSULTATION:  09/26/2012  REFERRING PHYSICIAN:   CONSULTING PHYSICIAN:  Catricia Scheerer A. Jovan Colligan, MD  CHIEF COMPLAINT: Abdominal pain, nausea, vomiting and diverticulitis with possible colovesicular fistula on CT scan.   HISTORY OF PRESENT ILLNESS: Ms. Garguilo is a pleasant 74 year old female with history of end-stage renal disease on hemodialysis who presents from hemodialysis center after having worsening abdominal pain, nausea and vomiting. She is a poor historian. She is able to keep food down prior to this. She is not currently endorsing any fevers, chills, night sweats, shortness of breath, cough, current nausea, vomiting, diarrhea, constipation, dysuria, hematuria.   PAST MEDICAL HISTORY:  1. History of end-stage renal disease on hemodialysis.  2. Hypertension.  3. Congestive heart failure.  4. Hypothyroidism.  5. Chronic obstructive pulmonary disease.  6. History of left upper extremity fistula.  7. Hysterectomy.  8. History of right lung collapse status post repair. 9. Cataracts.   ALLERGIES: Shellfish.   HOME MEDICATIONS:  1. Advair Diskus.  2. Norvasc.  3. Atarax.  4. Benadryl. 5. Isosorbide mononitrate.  6. Metoprolol.  7. PhosLo.  8. Sensipar.  9. Synthroid.   SOCIAL HISTORY: Lives with her husband. Quit smoking 20 years ago. Denies alcohol or drugs.   FAMILY HISTORY: Father died of cancer. Mother died of coronary artery disease.   REVIEW OF SYSTEMS: Total 12 point review of systems was obtained and pertinent positives and negatives are as above.   PHYSICAL EXAM:  VITAL SIGNS: Temperature 98.5, pulse 78, blood pressure 143/85, respirations 18, 100% on room air.   GENERAL: No acute distress, alert and oriented x3.   HEAD: Normocephalic, atraumatic.   EYES: No jaundice. No scleral icterus. No conjunctivitis.   FACE: No facial trauma. Normal external mouth. Normal external ears.    NECK: No obvious visible masses. No skin lesions.   CHEST: Lungs clear to auscultation, moving air well.   HEART: Regular rate, rhythm. No murmurs, rubs, or gallops.   ABDOMEN: Soft, nontender, nondistended, is somewhat tender particularly in the suprapubic region.   EXTREMITIES: Moves all extremities well. Strength 5/5.   LABORATORY, DIAGNOSTIC AND RADIOLOGICAL DATA: Labs are significant for electrolyte abnormalities due to renal disease, otherwise white cell count 10.4, hemoglobin and hematocrit within normal limits. INR 1.1.   I personally evaluated and reviewed the CT. There is sigmoid diverticulitis and air within the bladder concerning for a colovesicular fistula.   ASSESSMENT AND PLAN: Ms. Overley is a pleasant 74 year old female with what sounds like colovesicular fistula and acute diverticulitis. Would recommend treating diverticulitis for now. Would make n.p.o. and give IV antibiotics until pain free and after that transition to p.o. Treat for two weeks total. Get colonoscopy in six weeks and will re-evaluate. If exam changes will perform sigmoid colectomy prior. No need for emergent colovesicular treatment or fistula treatment.   ____________________________ Glena Norfolk. Darus Hershman, MD cal:cms D: 09/26/2012 18:01:08 ET T: 09/27/2012 08:29:26 ET JOB#: [id number]  cc: Harrell Gave A. Torii Royse, MD, <Dictator> Floyde Parkins MD ELECTRONICALLY SIGNED 09/29/2012 18:27

## 2015-04-15 NOTE — Consult Note (Signed)
PATIENT NAME:  [name], [name] MR#:  [id number] DATE OF BIRTH:  01/12/1941  DATE OF CONSULTATION:  12/07/2012  REFERRING PHYSICIAN:  Molly Maduro, MD  CONSULTING PHYSICIAN:  Winslow Sink, MD  REASON FOR HOSPITALIZATION: Perforation after colonoscopy, perforation of the splenic flexure of the colon.   REASON FOR CONSULTATION: Dyspnea and hypoxemia.    HISTORY OF PRESENT ILLNESS: [name] is a very nice 74 year old female who has history of sigmoid diverticulitis with a colovesical fistula, end-stage renal disease, end-stage anemia of chronic disease, hypertension, congestive heart failure, COPD, and hypothyroidism who was admitted on 12/05/2012 due to a procedure done, colonoscopy, due to needing additional biopsies. Apparently in October 2013 she had a sigmoid diverticulitis and a colovesical fistula for what she underwent a colonoscopy. She had a polyp that was biopsied which showed no cancer. She had a second colonoscopy to obtain more biopsies on 12/04/2012. There was apparently no significant disease of the splenic flexure but there was a complication trying to get through a tortuous sigmoid colon which had multiple diverticula. Finally switching colonoscopy to a pediatric colonoscopy, the patient was able to complete the procedure and discharged. Abdominal pain was significant after the procedure subsided but during the time of her hemodialysis she started having more abdominal pain for which the patient went to the ER. She had a CT scan that showed a significant pneumoperitoneum. The patient was admitted for Dr. Jolee Ewing who took her to the OR due to colonic perforation and she underwent an exploratory laparotomy, enterolysis, and repair of colon perforation. At this moment she did not have any significant complications. On the 11th the patient had no nausea. She has had no flatus or bowel movements. She remained n.p.o. She was seen by Nephrology for continuation of her care.  She had a fever of 100.2. On 12/12 the patient had a significant desaturation of oxygen down to 68% at 2 liters nasal cannula for what the patient was put on high flow nasal cannula at 75%. At this moment her oxygen saturations were 96. She had ABG ordered showing significant arterial alveolar oxygen gradient of more than 400 which is a significant mismatch. Since the patient is postoperative, she is at risk of pulmonary embolism but also she had significant atelectasis that could be contributing to the mismatch. For that reason, CT scan with IV contrast was ordered. I'm aware that the patient has significant kidney failure but she is on dialysis so contrast would not be a contraindication at this moment. A V/Q scan would not give me the information I need since she could have atelectasis. The patient at this moment is okay. She went to dialysis. Her blood pressure is 115/76. She is comfortable, she is tachycardic, but overall hemodynamically stable. The patient has no other issues at this moment. Her pain is well controlled.   REVIEW OF SYSTEMS: 12 system review of systems is done. The patient denies any significant fever, although she had a temperature of 100.2. She denies any significant fluctuations of her weight. She has occasional fluid retention but at this moment does not seem to be severe. She denies any visual abnormalities at this moment. No changes in her eyesight. No visual disturbances. RESPIRATORY: Positive shortness of breath. Positive mild cough. ENT: Denies any tinnitus or difficulty swallowing. GI: Positive abdominal pain due to postsurgical pain. No flatus. No bowel movement. GU: The patient does not make much urine. She is on hemodialysis. MUSCULOSKELETAL: The patient denies any significant joint pains, effusions,  or gout. PSYCHIATRIC: Negative for significant depression or anxiety at this moment. NEUROLOGIC: Negative tingling, significant weakness, ataxia, or major headaches. CARDIOVASCULAR:  No chest pain. No orthopnea. HEMATOLOGIC/LYMPHATIC: The patient has anemia of chronic disease. Denies any significant history of bleeding or blood clots in the past. SKIN: Without any significant new lesions or masses. LYMPHATIC: Negative for lymphadenopathy. GYN: Negative for vaginal bleeding or breast problems. The patient denies any other medical problems.   PAST MEDICAL HISTORY:  1. End-stage renal disease.  2. Hypertension.  3. Congestive heart failure, diastolic dysfunction.  4. Hypothyroidism.  5. Chronic obstructive pulmonary disease.  6. Colonic polyps.  7. History of sigmoid diverticulitis.  8. Colovesical fistula.  9. Anemia of chronic disease.  10. Hypertension.  11. Hypothyroidism.   PAST SURGICAL HISTORY:  1. Left upper extremity AV fistula.  2. Kidney biopsy.  3. Breast biopsy.  4. Hysterectomy.  5. History of pneumothorax with chest tube of the right lung.  6. Cataract surgery.   ALLERGIES: The patient is allergic to shellfish.   SOCIAL HISTORY: The patient lives with her husband. She is a former smoker and smoked for over 20 years 1 pack a day but she quit actually about 20 years ago. She denies any alcohol or drug abuse. She is very independent, fully ambulatory.   FAMILY HISTORY: No history of coronary artery disease. No history of cancer.   CURRENT MEDICATIONS: At this moment the patient is on:  1. Ceftriaxone 1 gram every 24 hours. 2. Clindamycin 600 mg every eight hours. 3. Famotidine 20 mg every 48 hours.  4. Heparin 500 units q.12. 5. Albuterol and Atrovent nebs. 6. Tylenol p.r.n.  7. Morphine p.r.n.  8. Phenergan and Ambien p.r.n.   HOME MEDICATIONS:  1. Synthroid 125 mcg once daily.  2. Sensipar 30 mg 1 tablet at bedtime. 3. PhosLo 667 mg 3 times a day.  4. Nephro-Vite. 5. Metoprolol succinate 25 mg once a day. 6. Isosorbide mononitrate 60 mg once a day. 7. Combivent and Advair Diskus.    PHYSICAL EXAMINATION:   VITAL SIGNS: Blood pressure  115/76, pulse 106 up to 118, respiratory rate around 18, temperature of 97.8, oxygen saturation 96% on 75% high flow mask.   GENERAL: The patient is actually alert and oriented x3, not in significant acute distress but she has mild respiratory distress especially when she tries to talk she gets a little bit agitated.   HEENT: Her pupils are equal and reactive. Extraocular movements are intact. Mucosa are moist.   NECK: Supple. No JVD. No thyromegaly. No adenopathy. No carotid bruits. Normal range of motion. There are no oral lesions. No oral exudates.   CARDIOVASCULAR: Regular rate and rhythm, slightly tachycardic. Positive systolic ejection murmur 2/6. No rubs. No displacement of PMI. No thrills.   LUNGS: Decreased expansion with decreased respiratory sounds in both bases. She has crackles in both bases. There are no tubular sounds. There is no rhonchi at this moment. There is no dullness to percussion. The patient uses accessory muscles especially when she talks.   ABDOMEN: Soft. It is nondistended. There is a midline incision from surgery. Bowel sounds are negative at this moment. No rebound tenderness. No guarding. No tympanic sounds of percussion.   EXTREMITIES: No cyanosis. No clubbing. +1 pitting edema. Capillary refill less than 3.   NEUROLOGIC: Cranial nerves II through XII are intact. Sensation is intact in all four extremities. No focal findings.   PSYCHIATRIC: Negative for significant distress or agitation. The patient is  alert and oriented x3.   LYMPHATICS: Negative for lymphadenopathy in neck or supraclavicular areas.   MUSCULOSKELETAL: No significant joint effusions or erythema on top of joints.   SKIN: Without any significant rashes or petechiae.   LABORATORY, DIAGNOSTIC, AND RADIOLOGICAL DATA: Creatinine 5.95, glucose 105, BUN 24, calcium 8.3, phosphorus 6.6. Troponins are negative. TSH 4.82. White blood cells 7.5, hemoglobin 10, hematocrit 31, platelets 152. ABG 7.46, pCO2  42, pO2 74% on 75% high flow nasal cannula, HCO3 29.   EKG normal sinus rhythm with tachycardia.   Previous chest x-ray showed nodular lesions with cavitations and today on chest x-ray I still see the nodular lesions. I do not appreciate the cavitations and they were not described by the radiologist. She has hypoinflation with bibasilar atelectasis and right pleural effusion.   ASSESSMENT AND PLAN: This is a 74 year old female with history of COPD, CHF, end-stage renal disease, anemia of chronic disease, hypertension, and hypothyroidism who is status post second colonoscopy due to colonic polyps and a second colonoscopy that, unfortunately, created some complications with perforation of the colon. After perforation of the colon, the patient developed significant abdominal pain with perforation for what she was admitted for an emergent repair of colon perforation and exploratory laparotomy with enterolysis.  1. Colon perforation. The patient is status post exploratory lap with repair of the perforation and enterolysis. The patient is stable from the point of view of the surgery.   2. Hypoxemia with dyspnea. I was consulted to take care of this problem since the patient has significant hypoxemia this morning with an oxygen saturation of 68%. The patient was put on a nonrebreather and then on high flow oxygen mask. Her oxygen saturations are now in the upper 90's. She is receiving 75% oxygen through the high flow nasal cannula ABGs show a significant alveolar arterial mismatch with arterial alveolar oxygen gradient of more than 400. Only two possibilities of this diagnosis are severe atelectasis or pulmonary embolism. The patient has atelectasis on her x-ray, which is the most likely diagnosis. Due to her risk factors and problems, I cannot rule out the possibility of PE unless we get a CT scan. The patient has end-stage renal disease for what I think we can proceed with IV contrast without a problem. She does  have also a shellfish allergy but not a true contrast allergy for what we are going to proceed with the test. She could be premedicated. For now I am going to work on correcting her atelectasis. We are going to order IS, out of bed ambulation,  respiratory treatments. For the most part she needs to start to get up and move around so ambulation will be a key. The other option of the patient's diagnosis could be fluid overload, although her x-ray does not show any significant fluid overload. She does have history of congestive heart failure, diastolic dysfunction, and end-stage renal disease with fluid overload. She just had dialysis. She actually felt better after the dialysis but she still has significant high oxygen needs. We are going to continue to work towards this diagnosis. We are going to cooperate with Surgery trying to get her oxygen levels better.  3. COPD. Continue nebs.  4. CHF. At this moment the patient has diastolic dysfunction. The only way to help pulmonary edema if present is hemodialysis for what the patient has already received treatment for that. She is also taking a beta-blocker which has been made IV so she can have it daily.  5. Hypothyroidism.  The patient has elevated TSH. She takes Synthroid 125 mcg daily. At this moment she is not getting that medication because she is not taking any pills. We are going to make adjustments and she can have Synthroid IV solution at 50% of that p.o. diet which will be around 62 so patient could tolerate 75 mcg.  6. Anemia of chronic disease worsened by acute blood loss anemia. At this moment is stable. No need for transfusion.  7. Other medical problems are stable. Continue medication management.   TIME SPENT: I spent about 50 minutes with this consult today.   ____________________________ [place] Sink, MD rsg:drc D: 12/07/2012 15:50:49 ET T: 12/08/2012 06:27:10 ET JOB#: [id number]  cc: [place] Sink, MD,  <Dictator> Lazer Woll[place] America Brown MD ELECTRONICALLY SIGNED 12/10/2012 14:14

## 2015-04-15 NOTE — Consult Note (Signed)
Pt seen and examined. See Dawn Harrison's notes. Pt with prob diverticulitis and colovesical fistula. Feels much better. No UGI sxs though CT suggests inflammation in stomach/duodenum. Recommend daily H2 blocker or PPI. Finish Abx. Colonoscopy in 1-2 month after diverticulitis treated. Consider BE to evaluate fistula if surgery wishes. Started clear liquid diet.  Electronic Signatures: Verdie Shire (MD)  (Signed on 02-Oct-13 15:08)  Authored  Last Updated: 02-Oct-13 15:08 by Verdie Shire (MD)

## 2015-04-15 NOTE — Consult Note (Signed)
Looks better today. Less tachypneic. Now having BM's. Diet being advanced. Will follow along. Thanks.  Electronic Signatures: Verdie Shire (MD)  (Signed on 15-Dec-13 10:37)  Authored  Last Updated: 15-Dec-13 10:37 by Verdie Shire (MD)

## 2015-04-15 NOTE — Consult Note (Signed)
PATIENT NAME:  [name], THUNE MR#:  [id number] DATE OF BIRTH:  1941/07/17  DATE OF CONSULTATION:  09/27/2012  REFERRING PHYSICIAN:   CONSULTING PHYSICIAN:  Payton Emerald, NP  ADDENDUM   PHYSICAL EXAMINATION:  VITAL SIGNS: Temperature 98.5 with a pulse of 78, respirations 18, blood pressure 143/85 with oxygen sat of 100% on room air.   GENERAL: Well developed elderly Caucasian female who is 74 years of age, resting comfortably in bed.   HEENT: Normocephalic, atraumatic. Pupils equal, reactive to light. Conjunctivae clear. Sclerae anicteric.   NECK: Supple. Trachea midline. No lymphadenopathy, thyromegaly.   PULMONARY: Symmetric rise and fall of chest. Clear to auscultation throughout.   CARDIOVASCULAR: Regular rhythm, S1, S2. No murmurs, no gallops.   ABDOMEN: Soft, nondistended. Bowel sounds in four quadrants. No bruits. No masses.   RECTAL: Deferred.   MUSCULOSKELETAL: Movement of all four extremities. No contractures. No clubbing.   EXTREMITIES: No edema.   PSYCH: Alert and oriented x4. Memory grossly intact. Appropriate affect and mood.       NEUROLOGICAL: No gross neurological deficits.   ____________________________ Payton Emerald, NP dsh:cms D: 10/17/2012 16:46:16 ET T: 10/17/2012 17:05:04 ET JOB#: [id number]  cc: Payton Emerald, NP, <Dictator> Payton Emerald MD ELECTRONICALLY SIGNED 10/19/2012 14:58

## 2015-04-15 NOTE — Consult Note (Signed)
Pt not in room. Getting HD now. I had talked to patient yest afternoon after I got the path report. Pt had a benign appearing polyp in cecum. Suspect specimen was contaminated from other biopsy specimen. Nevertheless, may be prudent to look at cecal area one more time at the time of surgery. Hopefully, breathing will improve with dialysis. Will check back tomorrow. Thanks.  Electronic Signatures: Verdie Shire (MD)  (Signed on 12-Dec-13 11:53)  Authored  Last Updated: 12-Dec-13 11:53 by Verdie Shire (MD)

## 2015-04-15 NOTE — H&P (Signed)
PATIENT NAME:  [name], STANDRE MR#:  [id number] DATE OF BIRTH:  [birth year]  DATE OF ADMISSION:  09/26/2012  PRIMARY CARE PHYSICIAN: [facility]  CHIEF COMPLAINT: Abdominal pain, nausea and vomiting.   HISTORY OF PRESENTING ILLNESS: The patient is a 74 year old Caucasian female patient with history of end-stage renal disease on hemodialysis, hypertension, hypothyroidism, chronic obstructive pulmonary disease, presents to the Emergency Room sent in from the dialysis center after the patient had worsening abdominal pain of one day, nausea and vomiting. The patient is unable to keep any food down. The patient had a CT scan of the abdomen in the ER which showed sigmoid diverticulitis with possible colovesical fistula. She is afebrile with normal white count and is being admitted to the Hospitalist Service for further management along with a Surgical consult. The patient missed her dialysis earlier today and will be getting dialysis later today.   PAST MEDICAL HISTORY:  1. End stage renal disease, on hemodialysis.  2. Hypertension.  3. Congestive heart failure.  4. Hypothyroidism.  5. Chronic obstructive pulmonary disease.   PAST SURGICAL HISTORY:  1. Left upper extremity AV fistula. 2. Kidney biopsy. 3. Breast biopsy.  4. Hysterectomy.  5. Right lung collapse, status post repair.  6. Cataracts.   ALLERGIES: Shellfish.     MEDICATIONS:  1. Advair Diskus 100/50, 1 puff b.i.d.  2. Amlodipine 10 mg oral once a day.  3. Atarax 25 mg orally every 8 hours as needed.  4. Combivent 90/18, 2 puffs inhaled every 6 hours.  5. Benadryl 25 mg, 1 capsule oral t.i.d. as needed.  6. Isosorbide mononitrate 60 mg oral once a day.  7. Metoprolol succinate extended release 25 mg oral once a day.  8. PhosLo 667 mg oral t.i.d.  9. Sensipar 30 mg oral once a day.  10. Synthroid 125 mcg oral once a day.   SOCIAL HISTORY: The patient lives with her husband. She quit smoking 20 years back. No alcohol. No  illicit drugs. She ambulates on her own.   CODE STATUS: FULL CODE.   FAMILY HISTORY: Father died of cancer. Mother had coronary artery disease.   REVIEW OF SYSTEMS: CONSTITUTIONAL: Complains of fatigue. No weight loss, weight gain. EYES: No inflammation, glaucoma, discharge or blurred vision. ENT: No ear pain, hearing loss, epistaxis. RESPIRATORY: No shortness of breath, cough, or wheezing. CARDIOVASCULAR: No chest pain, orthopnea. Does have some mild edema in lower extremities. GI: Complains of nausea and two episodes of vomiting and low abdominal pain. GENITOURINARY: Does not make any urine. ENDOCRINE: Has hypothyroidism, no polyuria. HEMATOLOGIC: Has anemia. No easy bruising, bleeding. SKIN: No rash, ulcers. MUSCULOSKELETAL: Has chronic low back pain and arthritis. NEUROLOGIC: No focal numbness or weakness. PSYCHIATRIC: No depression, anxiety.   PHYSICAL EXAMINATION:  VITAL SIGNS: Temperature 98.5, pulse of 78, respirations 18, blood pressure 143/85, saturating 100% on room air.   GENERAL: Elderly Caucasian female patient lying in bed, comfortable, conversational, cooperative with exam.   PSYCHIATRIC: Alert, oriented x3. Mood and affect appropriate. Judgment intact.   HEENT: Atraumatic, normocephalic. Oral mucosa moist and pink. External ears and nose normal. No pallor. No icterus. Pupils are bilaterally equal and reactive to light.   NECK: Supple. No thyromegaly. No palpable lymph nodes. Trachea midline. No carotid bruit, JVD.   CARDIOVASCULAR: S1, S2, regular rate and rhythm without any murmurs.   RESPIRATORY: Normal work of breathing. Clear to auscultation on both sides.   GASTROINTESTINAL: Soft abdomen, tenderness in suprapubic area. No rigidity, guarding. Bowel sounds present. No  hepatosplenomegaly.   SKIN: Warm and dry. No petechiae, rash, ulcers.   EXTREMITIES: No edema.   NEUROLOGICAL: Motor strength of 5 out of 5 in upper and lower extremities. Sensation to fine touch intact  all over.   LABORATORY, DIAGNOSTIC AND RADIOLOGICAL DATA: Laboratory studies show glucose 111, BUN 46, creatinine 9.72, sodium 136, potassium 3.1. Troponin less than 0.02. WBC 10.4, hemoglobin 11.6, platelets 255. CT scan of the abdomen showed sigmoid diverticulitis with colovesical fistula.   ASSESSMENT AND PLAN:  1. Sigmoid diverticulitis with colovesical fistula: We will start the patient on Cipro and metronidazole. Get blood cultures. Consult Surgery for further input with the case. The patient will be n.p.o.  2. End-stage renal disease, on hemodialysis: Consult Nephrology. The patient will need dialysis later today as she missed it earlier today.  3. Anemia: Stable.  4. Hypertension: Stable.  5. Deep venous thrombosis prophylaxis with Heparin.   CODE STATUS: FULL CODE.    TIME SPENT: Time spent today on this case was 45 minutes with more than 50% time spent in coordination of care.   ____________________________ Leia Alf. Dawan Farney, MD srs:cbb D: 09/26/2012 14:54:19 ET T: 09/26/2012 15:08:18 ET JOB#: [id number]  cc: Alveta Heimlich R. Kacey Dysert, MD, <Dictator> [place] MD ELECTRONICALLY SIGNED 09/28/2012 11:21

## 2015-04-15 NOTE — Consult Note (Signed)
Pt stable. Tolerated full liquid diet. BM's today. Just given solids for lunch. Hgb going down. To receive blood transfusions. Breathing ok. I will check back on Wed. Thanks  Electronic Signatures: Verdie Shire (MD)  (Signed on 16-Dec-13 13:19)  Authored  Last Updated: 16-Dec-13 13:19 by Verdie Shire (MD)

## 2015-04-15 NOTE — Op Note (Signed)
PATIENT NAME:  [name], [name] MR#:  [id number] DATE OF BIRTH:  28-Oct-1941  DATE OF PROCEDURE:  12/05/2012  PREOPERATIVE DIAGNOSIS: Colonic perforation.   POSTOPERATIVE DIAGNOSIS: Colonic perforation.   SURGEON: Consuela Mimes, MD  ANESTHESIA: General.   OPERATION PERFORMED: Left subclavian vein central venous catheter placement.   PROCEDURE IN DETAIL: The patient was placed in the Trendelenburg position on the Operating Room table and a triple lumen central venous catheter was placed utilizing the double Seldinger technique and was sutured in place and withdrew blood easily and was flushed with heparinized saline easily. It was dressed sterilely. There were no complications. Chest x-ray will be performed postoperatively in the recovery room.   ____________________________ Consuela Mimes, MD wfm:cms D: 12/05/2012 19:42:09 ET T: 12/06/2012 09:16:51 ET JOB#: [id number]  cc: Consuela Mimes, MD, <Dictator>  Consuela Mimes MD ELECTRONICALLY SIGNED 12/06/2012 14:29

## 2015-04-15 NOTE — Consult Note (Signed)
Chief Complaint:   Subjective/Chief Complaint Feels weak. Still SOB though improving. No flatus yet. To have HD later today.   VITAL SIGNS/ANCILLARY NOTES: **Vital Signs.:   14-Dec-13 05:30   Vital Signs Type Routine   Temperature Temperature (F) 99   Celsius 37.2   Temperature Source Oral   Pulse Pulse 92   Respirations Respirations 17   Systolic BP Systolic BP 97   Diastolic BP (mmHg) Diastolic BP (mmHg) 63   Mean BP 74   Pulse Ox % Pulse Ox % 94   Pulse Ox Activity Level  At rest   Oxygen Delivery High Flow Nasal Cannula   Brief Assessment:   Cardiac Regular    Respiratory postive use of accessory muscles    Gastrointestinal decreased bowel sounds with some tenderness   Lab Results: Routine Chem:  14-Dec-13 04:13    Result Comment LABS - This specimen was collected through an   - indwelling catheter or arterial line.  - A minimum of 95mls of blood was wasted prior    - to collecting the sample.  Interpret  - results with caution.  Result(s) reported on 09 Dec 2012 at 04:41AM.   Glucose, Serum 83   BUN  34   Creatinine (comp)  6.84   Sodium, Serum  134   Potassium, Serum 4.9   Chloride, Serum  95   CO2, Serum 28   Calcium (Total), Serum 8.8   Anion Gap 11 (Result(s) reported on 09 Dec 2012 at 04:41AM.)   Osmolality (calc) 275  Routine Hem:  14-Dec-13 04:13    WBC (CBC) 5.5   RBC (CBC)  2.42   Hemoglobin (CBC)  8.2   Hematocrit (CBC)  24.0   Platelet Count (CBC)  146   MCV 99   MCH 34.0   MCHC 34.3   RDW  16.4   Neutrophil % 78.7   Lymphocyte % 11.8   Monocyte % 7.6   Eosinophil % 1.7   Basophil % 0.2   Neutrophil # 4.3   Lymphocyte #  0.6   Monocyte # 0.4   Eosinophil # 0.1   Basophil # 0.0   Assessment/Plan:  Assessment/Plan:   Assessment Post surgery. SOB    Plan For HD today. Moniter breathing. Awaiting bowel activities.   Electronic Signatures: Verdie Shire (MD)  (Signed 15-Dec-13 10:02)  Authored: Chief Complaint, VITAL SIGNS/ANCILLARY  NOTES, Brief Assessment, Lab Results, Assessment/Plan   Last Updated: 15-Dec-13 10:02 by Verdie Shire (MD)

## 2015-04-15 NOTE — Consult Note (Signed)
PATIENT NAME:  [name], SACHS MR#:  [id number] DATE OF BIRTH:  18-Oct-1941  DATE OF CONSULTATION:  09/27/2012  REFERRING PHYSICIAN:  Srikar R. Sudini, MD CONSULTING PHYSICIAN:  Verdie Shire, MD/Brinley Treanor Ruthe Mannan, NP PRIMARY CARE PHYSICIAN: She does not have one.  REASON FOR CONSULTATION: Melena, anemia, and colovesical fistula.   HISTORY OF PRESENT ILLNESS: [name] is a 74 year old Caucasian female who presented to [place] [facility] on 09/26/2012 for the concern of abdominal pain with associated nausea and vomiting. She was just hospitalized at [facility] last week for congestive heart failure. She did receive 2 units of packed red blood cells during this hospitalization. She was found to have a hemoglobin 6.0. She was discharged this past Wednesday. On Thursday she received hemodialysis, Saturday received her hemodialysis, and she does have a significant past medical history of end-stage renal disease, hypertension, congestive heart failure, hypothyroidism and chronic obstructive pulmonary disease. Sunday she started vomiting. She states that every time she ate or drank anything that "it would just come right back up." On Monday, she had acute onset of suprapubic abdominal pain described as a constant sharp pain. Since Sunday, she has been experiencing diarrhea as well. Normally her bowels move on average once a day. Now bowel habit pattern is 5 times a day. Consistency of stool is loose watery. She had evidence of bright red blood yesterday evening. She has history of colonoscopy done numerous years ago. She states that three polyps were resected at that time. This was performed by a gastroenterologist in [place], [place]. No history of EGD in the past. No nausea. No vomiting. The patient denies any melena.   PAST MEDICAL HISTORY:  1. End-stage renal disease.  2. Hypertension.  3. Congestive heart failure.  4. Hypothyroidism.  5. Chronic obstructive  pulmonary disease.  6. Personal history of colonic polyps.  7. Diverticulosis.   PAST SURGICAL HISTORY:  1. Left upper extremity AV fistula. 2. Renal biopsy. 3. Breast biopsy. 4. Hysterectomy.  5. Right lung collapse, status post repair.  6. Cataract surgery.   ALLERGIES: Shellfish.   HOME MEDICATIONS:  1. Advair Diskus 100/50, 1 puff b.i.d.  2. Amlodipine 10 mg once a day.  3. Atarax 25 mg every 8 hours as needed.  4. Combivent 90/18, 2 puffs every 6 hours.  5. Benadryl 25 mg, 1 capsule t.i.d. as needed.  6. Isosorbide mononitrate 60 mg once a day. 7. Metoprolol succinate  extended release 25 mg once a day. 8. PhosLo 667 mg orally t.i.d.  9. Sensipar 30 mg once a day, 10. Synthroid 125 mcg once a day.   FAMILY HISTORY: No family history of colon cancer or colonic polyps. Father with history of bone cancer.   SOCIAL HISTORY: She resides with her husband. Remote tobacco use, quit 20 years ago. No alcohol.   REVIEW OF SYSTEMS: CONSTITUTIONAL: Complaint of fatigue. No weight loss. No weight gain. EYES: No inflammation, glaucoma, history of cataracts. No blurred vision. ENT: No ear pain, no hearing loss, epistaxis. RESPIRATORY: Denies any dyspnea, coughing or wheezing. CARDIOVASCULAR: No chest pain, no orthopnea, though recently admitted for congestive heart failure. See history of present illness. Mild edema to lower extremities. GASTROINTESTINAL: See history of present illness. GU: The patient does not void, receiving hemodialysis. ENDOCRINE: Significant history of hypothyroidism. No polyuria. HEMATOLOGIC: History of anemia, on recent hospitalization prior to this one was found to have a hemoglobin of 6.0.  Denies significant easy bruising or bleeding. SKIN: No rashes,  lesions, or ulcers. MUSCULOSKELETAL: Chronic back pain, arthritic discomfort. NEUROLOGICAL: No weakness. No history of transient ischemic attack or CVA. PSYCH: No depression or anxiety.   LABORATORY, DIAGNOSTIC AND  RADIOLOGICAL DATA: Chemistry panel on 10/01 revealed glucose of 111, BUN 46, creatinine was 9.72, potassium was 3.1, CO2 was 20, EGFR was 4.0. Comparison to today's date: Glucose is within normal range at 84. BUN has improved to 21. Creatinine has improved to 5.24, chloride is 96, CO2 is 33 and EGFR is at 9.0. Phosphate was low on admission at 1.8, which has been corrected to 4.7. Hepatic panel: Albumin is 2.9, AST is 9, otherwise within normal limits. Albumin declined to 2.5 today, AST is 13, and ALT is 11.0.  CK total on admission was 14, with a CK-MB of less than 0.5, and troponin less than 0.02. CBC: WBC count was 10.4 on admission, hemoglobin 11.6 with hematocrit of 32.7. Hemoglobin declined to 10.3 today with hematocrit of 30.4. PT is 15.0 with an INR of 1.1 and a PTT of 40.7. Blood culture x2: No growth in 18 to 24 hours. Methicillin-resistant Staphylococcus aureus screen is negative. T3 uptake is high at 40% but just mildly elevated. Hepatitis B surface antigen is negative. Hemoccult fecal is positive.   IMPRESSION:  1. Anemia.  2. Admitted for acute abdominal pain with CT scan findings of acute diverticulitis and colovesical fistula.  3. History of end-stage renal disease with hemodialysis.  4. Personal history of colonic polyps.   PLAN: The patient's presentation was discussed with Dr. Verdie Shire. Recommendation is for the patient to continue with antibiotic therapy. The patient should be on PPI therapy based on CT scan findings. The patient needs to complete antibiotic therapy. Recommendation is for colonoscopy in one month after completion pending surgical evaluation. If a fistula is of concern, would recommend proceeding with an air contrast barium enema prior to colonoscopy being performed. We will continue to monitor during hospitalization.   These services provided by Payton Emerald, MS, APRN, [facility], FNP,  under collaborative agreement with Verdie Shire, MD.    ____________________________ Payton Emerald, NP dsh:cbb D: 09/27/2012 14:49:32 ET T: 09/27/2012 15:18:31 ET JOB#: [id number]  cc: Payton Emerald, NP, <Dictator> Payton Emerald MD ELECTRONICALLY SIGNED 09/29/2012 13:19

## 2015-04-15 NOTE — Consult Note (Signed)
Brief Consult Note: Diagnosis: Anemia.  Admitted for acute abdominal pain with CT scan finding of acute diverticulitis and colovesical fistula.  History of ESRD with hemodialysis.  Personal history of colonic polyps.   Comments: Patient's presentation discussed with Dr. Verdie Shire.  Recommendation is for patient to continue with antibiotic therapy.  Patient should be on PPI therapy based on CT scan findings.  Patient needs to complete antibiotic therapy and recommendation is for colonoscopy one month after the completion.  Pending surgical evaluation and recommendations.  If fistula is of concern would recommend ACBE prior to colonoscopy being done.  Will continue to monitor during hospitalization.  Electronic Signatures: Payton Emerald (NP)  (Signed 02-Oct-13 14:49)  Authored: Brief Consult Note   Last Updated: 02-Oct-13 14:49 by Payton Emerald (NP)

## 2015-04-15 NOTE — Discharge Summary (Signed)
PATIENT NAME:  [name], [name] MR#:  [id number] DATE OF BIRTH:  02-29-1941  DATE OF ADMISSION:  09/26/2012 DATE OF DISCHARGE:  09/28/2012  PRIMARY CARE PHYSICIAN: [facility].   DISCHARGE DIAGNOSES:  1. Acute sigmoid diverticulitis.  2. Colovesical fistula.  3. End stage renal disease on hemodialysis.  4. Anemia of end-stage renal disease.   CONSULTS:  1. Dr. Candace Cruise, gastroenterology.  2. Dr. Holley Raring, nephrology. 3. Dr. Rexene Edison, surgery.   PROCEDURES: None.   IMAGING STUDIES: CT scan of the abdomen which showed sigmoid diverticulitis and possible colovesical fistula with air in the bladder.   ADMITTING HISTORY AND PHYSICAL: Please see detailed history and physical dictated on 09/26/2012. In brief, a 74 year old Caucasian female patient with history of end-stage renal disease on hemodialysis, hypertension, hypothyroidism and chronic obstructive pulmonary disease who presented to the Emergency Room complaining of abdominal pain. The patient was sent from dialysis center. The patient's CT scan showed acute sigmoid diverticulitis with possible colovesical fistula and was admitted for further work-up and treatment.   HOSPITAL COURSE:  1. Acute sigmoid diverticulitis. The patient was on ciprofloxacin and Flagyl during the hospital stay and will be discharged home on oral ciprofloxacin and Flagyl for nine more days. The patient is afebrile. White count in the normal range, tolerating oral food.  2. Colovesical fistula. The patient was seen by surgery, Dr. Rexene Edison, who suggested outpatient follow-up and gastroenterology follow up with colonoscopy if needed. The CT scan was not clear, but there could be a possible colovesical fistula. Presently, the treatment will be for acute diverticulitis and gastroenterology follow-up in 2 to 4 weeks. The patient was seen by Dr. Candace Cruise of gastroenterology in the hospital.  3. Anemia of end-stage renal disease. The patient has had chronic anemia between 8 to 9 which is  stable. The patient did have Hemoccult positive stool which was thought to be likely from the diverticulitis and an outpatient colonoscopy will be done in 2 to 4 weeks by Dr. Candace Cruise. 4. The patient's hypertension and hypothyroidism were well controlled during the hospital stay. She did get dialysis for end-stage renal disease.   CONDITION ON DISCHARGE: On the day of discharge, the patient's blood pressure is 130/75, saturating 94% on room air, 98% on 2 liters oxygen, afebrile and abdominal examination is benign and is being discharged home in fair condition.   DISCHARGE MEDICATIONS:  1. Ciprofloxacin 500 mg oral once a day for nine days.  2. Flagyl 500 mg oral 3 times a day for nine days.  3. Metoprolol succinate 25 mg oral once a day.  4. Atarax 25 mg oral every eight hours as needed.  5. Diphenhydramine 25 mg oral 3 times a day as needed.  6. PhosLo Gelcaps 667 mg oral 3 times a day.  7. Advair Diskus 100/50, one puff inhaled twice a day.  8. Isosorbide mononitrate 60 mg oral once a day.  9. Sensipar 30, one tablet oral once a day.  10. Nephro-Vite one tablet oral once a day.  11. Synthroid 125 mcg oral once a day.  12. Combivent 2 puffs inhaled every six hours.  13. Amlodipine 10 mg oral once a day.   DISCHARGE INSTRUCTIONS:  1. The patient will be discharged home on a renal diet with activity as tolerated.  2. She will follow up with Dr. Candace Cruise of gastroenterology for a colonoscopy.  3. Continue dialysis as before.          TIME SPENT TODAY ON THIS DISCHARGE DICTATION ALONG WITH COORDINATING  CARE AND COUNSELING OF THE PATIENT: 35 minutes.    ____________________________ Leia Alf Bellami Farrelly, MD srs:ap D: 09/28/2012 10:57:43 ET T: 09/28/2012 13:28:03 ET JOB#: [id number]  cc: Alveta Heimlich R. Quanisha Drewry, MD, <Dictator> [place] MD ELECTRONICALLY SIGNED 09/29/2012 7:46

## 2015-04-15 NOTE — Consult Note (Signed)
History of Present Illness:   Reason for Consult newly diagnosed colon cancer    Date of Diagnosis 06-Dec-2012    Pathology Report Polyp biopsy-splenic flexure    HPI   74 year old female with history of end-stage renal disease on HD since 2002, hypertension, hypothyroidism, emphysema, AOCD, hx of bleeding from LUE AVF, secondary hyperparathyroidism, colonic perforation s/p repair 12/05/12, colon cancer on colonic biopsy ESRD on HD TTHSsigmoid diverticulosis/colovesical fistula/colonic perforation/colon cancer: had colonic perforation s/p colonoscopy, underwent surgical repair 12/05/12, colon cancer on biopsy noted, pt to go for additional surgery.   PFSH:   Family History noncontributory    Social History negative alcohol, negative tobacco, Quit smoking 20 years ago   Review of Systems:   General weakness  pain    Performance Status (ECOG) 3    Lungs SOB    GI pain    Musculoskeletal no complaints    Extremities no complaints    Skin no complaints    Endocrine no complaints    Psych no complaints   NURSING NOTES: ED Vital Sign Flow Sheet:   10-Dec-13 15:16    Temp Temperature: 99.1    Respirations Respirations: 20    SBP SBP: 114    DBP DBP: 58    Pulse Ox % Pulse Ox %: 95    Pulse Ox Source Source: Oxygen; 2L [place]    Pain Scale (0-10) Pain Scale (0-10): Scale:3    Telemetry pattern Cardiac Rhythm: Normal sinus rhythm    Cardiac Monitor On: Yes   Physical Exam:   General Frail ill-looking elderly female    HEENT: normal    Lungs: rales    Cardiac: regular rate, rhythm    Breast: not examined    Abdomen: tender  distended    Skin: intact    Extremities: No edema, rash or cyanosis    Neuro: AAOx3    Psych: normal appearance    Pollen: SOB  Shellfish: Hives  Laboratory Results:  Hepatic:  12-Dec-13 15:58    Bilirubin, Total 0.7   Alkaline Phosphatase 72   SGPT (ALT)  8   SGOT (AST)  11   Total Protein, Serum 6.6   Albumin,  Serum  2.9  Routine Chem:  12-Dec-13 15:58    Glucose, Serum 95   BUN 17   Creatinine (comp)  4.91   Sodium, Serum 137   Potassium, Serum 4.3   CO2, Serum 28   Calcium (Total), Serum 8.7   Anion Gap 10   Osmolality (calc) 275   eGFR (African American)  10   eGFR (Non-African American)  8 (eGFR values <76m/min/1.73 m2 may be an indication of chronic kidney disease (CKD). Calculated eGFR is useful in patients with stable renal function. The eGFR calculation will not be reliable in acutely ill patients when serum creatinine is changing rapidly. It is not useful in  patients on dialysis. The eGFR calculation may not be applicable to patients at the low and high extremes of body sizes, pregnant women, and vegetarians.)  Routine Coag:  12-Dec-13 15:58    Prothrombin  16.0   INR 1.2 (INR reference interval applies to patients on anticoagulant therapy. A single INR therapeutic range for coumarins is not optimal for all indications; however, the suggested range for most indications is 2.0 - 3.0. Exceptions to the INR Reference Range may include: Prosthetic heart valves, acute myocardial infarction, prevention of myocardial infarction, and combinations of aspirin and anticoagulant. The need for a higher or lower target INR must be  assessed individually. Reference: The Pharmacology and Management of the Vitamin K  antagonists: the seventh ACCP Conference on Antithrombotic and Thrombolytic Therapy. UJWJX.9147 Sept:126 (3suppl): [id number]. A HCT value >55% may artifactually increase the PT.  In one study,  the increase was an average of 25%. Reference:  "Effect on Routine and Special Coagulation Testing Values of Citrate Anticoagulant Adjustment in Patients with High HCT Values." American Journal of Clinical Pathology 2006;126:400-405.)  Routine Hem:  12-Dec-13 15:58    WBC (CBC) 6.9   RBC (CBC)  2.82   Hemoglobin (CBC)  9.3   Hematocrit (CBC)  27.9   Platelet Count (CBC)  145    MCV 99   MCH 33.0   MCHC 33.3   RDW  16.3   Neutrophil % 82.9   Lymphocyte % 9.2   Monocyte % 5.2   Eosinophil % 1.6   Basophil % 1.1   Neutrophil # 5.7   Lymphocyte #  0.6   Monocyte # 0.4   Eosinophil # 0.1   Basophil # 0.1 (Result(s) reported on 07 Dec 2012 at 04:21PM.)   Assessment and Plan:  Impression:   Elderly female with ESRD on hemodialysis with now colonic perforation.diagnosed colon cancer-staging pending  Plan:   Will await further colon surgery and more definitive pathology and staging exams prior to consideration of therapy.await surgery and see patient postoperatively 2-3 weeks post surgery for staging and consideration of further management.was discussed with patient and her daughter.appreciate being involoved in the care of this lady.    Treatment Plan Will await surgery and staging. Will see as out patient mid January once she has recovered from planned surgery.   Electronic Signatures: Georges Mouse (MD)  (Signed 14-Dec-13 11:20)  Authored: HISTORY OF PRESENT ILLNESS, PFSH, ROS, NURSING NOTES, PE, ALLERGIES, HOME MEDICATIONS, LABS, ASSESSMENT AND PLAN, TREATMENT PLAN   Last Updated: 14-Dec-13 11:20 by Georges Mouse (MD)

## 2015-04-18 NOTE — H&P (Signed)
PATIENT NAME:  [name], [name] MR#:  [id number] DATE OF BIRTH:  [birth year]  DATE OF ADMISSION:  10/07/2013  REFERRING PHYSICIAN: Dr. Jasmine December.  PRIMARY CARE PHYSICIAN: Lisette Grinder, MD   CHIEF COMPLAINT: Nausea, vomiting.  HISTORY OF PRESENT ILLNESS: The patient is a 74 year old African American female with past medical history of COPD, end-stage renal disease on hemodialysis Tuesday, Thursday, Saturday, congestive heart failure, paroxysmal atrial fibrillation as well as colon cancer, status post right and transverse hemicolectomy with primary anastomosis, performed 09/18/2013, who is presenting with nausea and vomiting. She notes symptoms for two day duration that worsened the day of admission to the point where she could not tolerate p.o. She mentions multiple bouts of nonbloody, nonbilious emesis. Denies any diarrhea, constipation.  Her last bowel movement was one day prior to admission, which was well formed. She noticed no blood or mucus. She denies any abdominal pain, fevers, chills, or sick contacts. Upon presentation, she was hypotensive in the Emergency Department, requiring IV fluid hydration. Abdominal CT performed revealing sigmoid diverticulosis with surrounding wall thickening and inflammation, consistent for acute diverticulitis. She has been given Zosyn in the Emergency Department. Currently, she is complaining only of dry mouth.   REVIEW OF SYSTEMS:  CONSTITUTIONAL: Denies any fevers, chills, fatigue, weakness or pain.  EYES: Denies blurred vision, double vision, eye pain.  ENT: Denies ear pain, hearing loss, discharge or dysphagia.  RESPIRATORY: Denies cough, wheeze, hemoptysis.  CARDIOVASCULAR: Denies chest pain, palpitations, orthopnea, or edema.  GASTROINTESTINAL: Positive for nausea, vomiting, as above. Denies any diarrhea, abdominal pain, hematemesis, or melena.  GENITOURINARY: Denies dysuria, hematuria.  ENDOCRINE: Denies nocturia or thyroid problems.  HEMATOLOGIC AND  LYMPHATIC: Denies easy bruising or bleeding.  SKIN: Denies any rashes or lesions.  MUSCULOSKELETAL: Denies any pain in the neck, back, shoulders, knees, hips. Denies any arthritis or swelling.  NEUROLOGIC: Denies any paralysis or paresthesias.  PSYCHIATRIC: Denies any anxiety or depressive symptoms.  Otherwise, full review of systems performed by me is negative.   PAST MEDICAL HISTORY: End-stage renal disease on hemodialysis via Port-A-Cath Tuesday, Thursday, Saturday, hypertension, congestive heart failure, last ejection fraction of 50% to 55% performed in 2013, hypothyroidism, COPD, and non-O2 dependent, diverticulosis, colon cancer diagnosed in 2013, now right and transverse hemicolectomy performed on 09/18/2013.   SOCIAL HISTORY: Resides at home. Remote history of tobacco abuse. No alcohol or drug usage.   FAMILY HISTORY: Negative for colon cancer. Her father had a history of bone cancer of unknown type.   ALLERGIES: SHELLFISH AND POLLEN.   HOME MEDICATIONS: Advair 100/50 mcg 1 puff b.i.d., Benadryl 25 mg as needed, Combivent 1/20 grams inhalation 1 puff 4 times daily as needed for shortness of breath, Imdur 60 mg p.o. daily, Rena-Vite, vitamin B complex with C and folic acid 1 tab p.o. daily, Sensipar 30 mg p.o. at bedtime, Synthroid 125 mcg p.o. daily.   PHYSICAL EXAMINATION:  VITAL SIGNS: On arrival, temperature 98.1, heart rate 91, respirations 20, blood pressure 79/48, saturating 100% on room air. Weight 60.5 kg, BMI 22.9.  GENERAL: Well-nourished, well-developed, African American female who is currently in no acute distress.  HEAD: Normocephalic, atraumatic.  EYES: Pupils equal, reactive to light. Extraocular muscles intact. No scleral icterus.  MOUTH: Dry mucosal membranes. Dentition intact. No abscesses noted.  EAR, NOSE, THROAT: Throat clear, without exudates. No external lesions.  NECK: Supple. No thyromegaly. No nodules. No JVD.  PULMONARY: Clear to auscultation bilaterally,  without wheezes, rubs or rhonchi. No use of accessory muscles. Good respiratory  effort.  CHEST: Nontender to palpation.  CARDIOVASCULAR: S1, S2, irregular rate, irregular rhythm. No murmurs, rubs, or gallops. No peripheral edema. Pedal pulses 2+ bilaterally.  GASTROINTESTINAL: Soft, nontender, nondistended. No masses. No hepatosplenomegaly. Positive bowel sounds. Midline incision, staples intact. Midline incision with minimal surrounding erythema, no discharge.  MUSCULOSKELETAL: No swelling, clubbing or edema. Range of motion of all extremities is full.  NEUROLOGIC: Cranial nerves II through XII intact. No gross focal neurological deficits. Sensation intact. Reflexes intact.  SKIN: No ulcerations, lesions, rashes, cyanosis. Skin is warm and dry. Turgor is poor.  PSYCHIATRIC: Mood and affect are within normal limits. Awake, alert, oriented x3. Insight and judgment are intact.   LABORATORY DATA: EKG: Atrial fibrillation, heart rate of 105, what appears to be incomplete left bundle branch, as well as nonspecific T abnormalities in V4, 5, and 6, which appeared new from previous EKGs. CT of the abdomen performed, revealing sigmoid diverticulosis with surrounding wall thickening and inflammation, consistent with acute diverticulitis. Sodium 132, potassium 3.2, chloride 96, bicarbonate 33, BUN 18, creatinine 5.33, glucose 130, calcium 10.7, lipase 267. WBC 9.5, hemoglobin 10.1, platelets of 358.   ASSESSMENT AND PLAN: A 74 year old female with history of chronic obstructive pulmonary disease, end-stage renal disease, colon cancer status post right and transverse hemicolectomy with primary anastomosis performed 09/18/2013, presenting with nausea, vomiting, oral intolerance. CT abdomen was performed, revealing sigmoid diverticulitis.   1.  Intractable nausea, vomiting, oral intolerance, likely partial small bowel obstruction. IV fluid hydration given hypotensive state on arrival, Zofran for symptom control. If  symptoms persist, may need nasogastric tube placement. 2.  Sigmoid diverticulitis. Her clinical story and exam are not entirely consistent with diverticulitis. Regardless, would continue antibiotics until completely ruled out, as there is evidence on CAT scan.  3.  EKG changes with no symptoms of chest pain. Will trend cardiac enzymes, including CK-MB, given end-stage renal disease.   4.  End-stage renal disease on hemodialysis. Consult nephrology, continue dialysis.  5.  Congestive heart failure, which is stable at this time. We will hold Imdur,  given current blood pressure. 6.  Hypothyroidism, on Synthroid. 7.  Chronic obstructive pulmonary disease. Advair p.r.n., albuterol, p.r.n. Combivent. 8.  Paroxysmal atrial fibrillation, rate controlled.  This is an old finding, as noted on EKG of 1 year ago.   CODE STATUS: The patient is full code.   TIME SPENT: 45 minutes   ____________________________ Aaron Mose. Rajni Holsworth, MD dkh:cg D: 10/07/2013 22:58:00 ET T: 10/08/2013 00:24:20 ET JOB#: [id number]  cc: Aaron Mose. Jerrelle Michelsen, MD, <Dictator> Kelsea Mousel Woodfin Ganja MD ELECTRONICALLY SIGNED 10/09/2013 0:21

## 2015-04-18 NOTE — Consult Note (Signed)
Brief Consult Note: Diagnosis: nausea, vomiting, diarrhea.   Patient was seen by consultant.   Recommend further assessment or treatment.   Comments: Patietn seen and examined, full consult to follow.  patietn presenting with nausea and vomiting 10/12.  Paitent with recent h/o right extended hemicolectomy 09/19/13. Chart reviewed.  Patietn reports diarrhea since her surgery, but n/v began last sunday.  no abdominal pain, though some discomfort in the right abdomen.  Patient sith h/o ESRDz, HD for over 10 years.  The n/v likely multifactirial ( esrd, recent surgery, possible stress gastritis), but the diarrhea is both physiologic related to the surgery as well as the current c. diff finding. Faster  transit time is likely decreasing contact time of abx needed for luminal treatemtn.  The proximal colon and some small amount of terminal ileum was removed.  This would result in a shorter absorptive pathway and fastr transit time through the colon, both leading to diarrhea, Will start cholestyramine in a few days, continue the combination treatemtn of  po vanc and flagyl, abx treatment course may need to be extended.  Will follow with you.  Electronic Signatures: Loistine Simas (MD)  (Signed 19-Oct-14 13:50)  Authored: Brief Consult Note   Last Updated: 19-Oct-14 13:50 by Loistine Simas (MD)

## 2015-04-18 NOTE — Consult Note (Signed)
Chief Complaint:  Subjective/Chief Complaint seen for diarrhea and n/v.  continues to improve.  no n/v, tolerating po, still with diarrhea.   VITAL SIGNS/ANCILLARY NOTES: **Vital Signs.:   22-Oct-14 12:02  Vital Signs Type Routine  Temperature Temperature (F) 98.3  Celsius 36.8  Temperature Source oral  Pulse Pulse 97  Respirations Respirations 18  Systolic BP Systolic BP 161  Diastolic BP (mmHg) Diastolic BP (mmHg) 66  Mean BP 90  Pulse Ox % Pulse Ox % 100  Pulse Ox Activity Level  At rest  Oxygen Delivery Room Air/ 21 %  *Intake and Output.:   22-Oct-14 07:41  Stool  1 large green stool   Brief Assessment:  Cardiac Regular   Respiratory clear BS   Gastrointestinal details normal Soft  Nontender  Nondistended  Bowel sounds normal   Lab Results: General Ref:  21-Oct-14 11:08   HBsAg ========== TEST NAME ==========  ========= RESULTS =========  = REFERENCE RANGE =  HEPATITIS B SURFACE AG  HBsAg Screen HBsAg Screen                    [   Negative             ]          Negative               [facility]            No: [id number]           [address], [place], [place] [address]           Lindon Romp, MD         [phone]   Result(s) reported on 17 Oct 2013 at 06:17AM.  Routine Chem:  22-Oct-14 05:12   Glucose, Serum  105  BUN 10  Creatinine (comp)  3.82  Sodium, Serum 137  Potassium, Serum 4.2  Chloride, Serum 101  CO2, Serum 31  Calcium (Total), Serum 8.6  Anion Gap  5  Osmolality (calc) 273  eGFR (African American)  13  eGFR (Non-African American)  11 (eGFR values <39m/min/1.73 m2 may be an indication of chronic kidney disease (CKD). Calculated eGFR is useful in patients with stable renal function. The eGFR calculation will not be reliable in acutely ill patients when serum creatinine is changing rapidly. It is not useful in  patients on dialysis. The eGFR calculation may not be applicable to patients at the low and high  extremes of body sizes, pregnant women, and vegetarians.)  Routine Hem:  22-Oct-14 05:12   WBC (CBC) 5.5  RBC (CBC)  2.49  Hemoglobin (CBC)  8.1  Hematocrit (CBC)  24.2  Platelet Count (CBC) 216  MCV 97  MCH 32.7  MCHC 33.7  RDW  21.9  Neutrophil % 71.0  Lymphocyte % 20.4  Monocyte % 6.4  Eosinophil % 1.2  Basophil % 1.0  Neutrophil # 3.9  Lymphocyte # 1.1  Monocyte # 0.4  Eosinophil # 0.1  Basophil # 0.1 (Result(s) reported on 17 Oct 2013 at 05:55AM.)   Assessment/Plan:  Assessment/Plan:  Assessment 1) diarrhea-c. diff positive, on po vancomycin and flagyl 2) diverticulitis-on po cipro-pain improved 3) s/p recent extended right hemicolectomy 4) diarrhea-multifactorial-#1, #3, #2 5) n/v improved, tolerating po.   Plan 1) finish 7 day course of cipro.  continue po flagyl/vanc for 10 day course then taper each over another week.   I will not be available until monday, Dr RRayann Hemancovering until the weekend.  Discussed with patient.   Electronic Signatures: Loistine Simas (MD)  (Signed 22-Oct-14 15:26)  Authored: Chief Complaint, VITAL SIGNS/ANCILLARY NOTES, Brief Assessment, Lab Results, Assessment/Plan   Last Updated: 22-Oct-14 15:26 by Loistine Simas (MD)

## 2015-04-18 NOTE — Op Note (Signed)
PATIENT NAME:  [name], [name] MR#:  [id number] DATE OF BIRTH:  1941/08/24  DATE OF PROCEDURE:  07/13/2013  PREOPERATIVE DIAGNOSES: 1.  End-stage renal disease requiring hemodialysis.  2.  Complication dialysis device with large hematoma formation left brachiocephalic fistula.   POSTOPERATIVE DIAGNOSES: 1.  End-stage renal disease requiring hemodialysis.  2.  Complication dialysis device with large hematoma formation left brachiocephalic fistula.   PROCEDURES PERFORMED: 1.  Contrast injection, right jugular vein.  2.  Placement of a cuffed tunneled dialysis catheter right jugular approach with ultrasound and  fluoroscopic guidance.   SURGEON: Hortencia Pilar, MD.   SEDATION: Versed 3 mg plus fentanyl 100 mcg administered IV. Continuous ECG, pulse oximetry and cardiopulmonary monitoring was performed throughout the entire procedure by the interventional radiology nurse. Total sedation time is 45 minutes.   ACCESS: Right IJ.   FLUOROSCOPY TIME: 1.5 minutes.   CONTRAST USED: Isovue 5 mL.   INDICATIONS: [name] is a 74 year old woman who presented to the office urgently yesterday from dialysis. Physical examination as well as noninvasive studies demonstrated a very large hematoma of the left brachiocephalic fistula, which rendered this fistula unable to be accessed. She is, therefore, undergoing placement of a catheter while the hematoma heals and resolves. Risks and benefits as well as alternative therapies were reviewed with the patient. All questions are answered. The patient agrees to proceed.   PROCEDURE: The patient is taken to special procedures and placed in the supine position. After adequate sedation is achieved, the right neck and chest wall are prepped and draped in sterile fashion. Ultrasound is placed in a sterile sleeve. Ultrasound is utilized secondary to lack of appropriate landmarks and to avoid vascular injury. Under direct ultrasound visualization, the jugular vein is  identified. It is echolucent, homogeneous, indicating patency. It is also compressible indicating patency. This is at the base of the neck. Image is recorded for the permanent record. Under direct ultrasound visualization, Seldinger needle is inserted. Good blood return is noted and the J-wire is advanced; however, it will not advance past the level of the clavicle by fluoroscopy. Ultimately, the wire was lost and reaccessing of the vein was required. This time with a micropuncture needle, microwire, followed by micro sheath. 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 This is approximately 75% to 80%.   Using a floppy microwire, this lesion was negotiated and the micro sheath was advanced beyond this level. J-wire was then subsequently advanced down under fluoroscopic guidance into the inferior vena cava. Using the dilators from the 19 cm tip to cuff Cannon catheter to dilate this lesion, the stricture of the jugular vein was dilated to a degree that allowed for passage of the peel-away sheath. The catheter was then woven over the wire and advanced through the peel-away sheath and the peel-away sheath and wire removed under fluoroscopic guidance. The tips were then adjusted for proper positioning, exit site selected and small incision made. Tunneling device is passed subcutaneously and then the catheter was pulled subcutaneously and final positioning was performed under fluoroscopic guidance. Catheter was then transected, hub connected. Both lumens aspirate and flush easily. Under fluoroscopy, there is a smooth contour to the catheter. Both lumens were packed with 5000 units of heparin. The catheter was secured to the skin of the chest wall with 0 silk, and the neck counterincision is closed with 4-0 Monocryl and Dermabond. Sterile dressing was applied. The patient tolerated  the procedure well and there were no  immediate complications. Sponge and needle counts were correct and she is taken to the recovery area in excellent condition.   ____________________________ Katha Cabal, MD ggs:dp D: 07/13/2013 09:03:00 ET T: 07/13/2013 10:07:20 ET JOB#: [id number]  cc: Katha Cabal, MD, <Dictator>  Katha Cabal MD ELECTRONICALLY SIGNED 07/14/2013 11:18

## 2015-04-18 NOTE — Consult Note (Signed)
Details:   - GI follow up.  Feels much better today.   N/v nearly resolved.   Still several episods of loose stool yesterday.  Going home today.   Exam:   GEN: a and o x 4, nad Chest: CTA, no w/c CV: reg, no m/rg Abd: mild diffuse tenderness.   A/P:   1.) Cont PO vanc +/- flagyl for 14 days total.   No further recommendations currently.   Electronic Signatures: Arther Dames (MD)  (Signed 24-Oct-14 14:07)  Authored: Details   Last Updated: 24-Oct-14 14:07 by Arther Dames (MD)

## 2015-04-18 NOTE — H&P (Signed)
PATIENT NAME:  Meredith, Reynolds MR#:  [id number] DATE OF BIRTH:  [birth year]  DATE OF ADMISSION:  09/12/2013  REFERRING PHYSICIAN: Elta Guadeloupe R. Jacqualine Code, MD  PRIMARY CARE PHYSICIAN: John B. Sarina Ser, MD  PRIMARY GASTROENTEROLOGY: Lupita Dawn. Candace Cruise, MD  PRIMARY ONCOLOGY: Janak K. Choksi, MD  CHIEF COMPLAINT: Abdominal pain.   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 The patient reports her symptoms have been going on for the last few days, reports around 3 to 4 days. Reports abdominal pain is mid abdominal, it is both cramping and sharp quality, intermittent, accompanied by some chills, but no fever. Complains of nausea, but denies any episodes of vomiting. Denies any hematemesis, any melena, any bright red blood per rectum. Reports the last bowel movement was last Sunday, before 3 days. She reports known history of diverticulosis in the past. The patient had CT abdomen and pelvis with p.o. contrast which did show evidence of right colon dilatation with evidence of partial colon obstruction. As well, there was mild evidence of inflammatory changes around diverticulosis site which is suspicious for diverticulitis, acute versus chronic. The patient did not have any leukocytosis, did not have any fever or chills. Abdomen was nontender at the time of my exam, and she received some pain medications. Hospitalist service was requested to admit the patient for further management and workup of her symptoms. The patient denies any chest pain, any shortness of breath, any diarrhea, any constipation.   PAST MEDICAL HISTORY:  1. End-stage renal disease, on hemodialysis Tuesday, Thursday, Saturday.  2. Hypertension.   3. Congestive heart failure.  4. Hypothyroidism.  5. COPD.  6. Colonic polyps. 7. Diverticulosis. 8. Colon cancer diagnosed in 2013.   PAST SURGICAL HISTORY:  1. Left upper extremity AV fistula. 2. Renal biopsy.  3. Breast biopsy.  4. Hysterectomy.  5. Right lung collapse, status post repair.  6. Cataract surgery.   ALLERGIES: SHELLFISH CAUSING URTICARIA AND HIVES.   SOCIAL HISTORY: The patient resides at home, has history of remote tobacco abuse, quit 20 years ago. No alcohol. No illicit drug use.   FAMILY HISTORY: No family history of colon cancer or colonic polyps. Father with history of bone cancer.   HOME MEDICATIONS:  1. Renvela 800 mg 3 tablets 3 times a day with meals.  2. Advair Diskus 100/50 one puff b.i.d.  3. Combivent as needed.  4. Synthroid 125 mcg oral daily.  5. Sensipar 30 mg oral daily at bedtime.  6. Isosorbide mononitrate 60 mg extended release oral daily in the morning.   REVIEW OF SYSTEMS:  CONSTITUTIONAL: Denies fever. Complains of chills, fatigue, weakness. Denies weight gain, weight loss.  EYES: Denies blurry vision, double vision, inflammation, glaucoma.  ENT: Denies tinnitus, ear pain, hearing loss, epistaxis or discharge.  RESPIRATORY: Denies cough, wheezing, hemoptysis, dyspnea or painful respiration.  CARDIOVASCULAR: Denies chest pain, edema, arrhythmia, palpitations, syncope.  GASTROINTESTINAL: Complains of nausea. Denies any vomiting, diarrhea, hematemesis, melena, rectal bleed, jaundice. Complains of abdominal pain.  GENITOURINARY: Denies dysuria, hematuria, renal colic. The patient is anuric due to her end-stage renal disease.  ENDOCRINE: Denies polyuria, polydipsia, heat or cold intolerance.  HEMATOLOGY: Denies anemia, easy bruising, bleeding diathesis.  INTEGUMENTARY: Denies acne, rash or skin lesions.  MUSCULOSKELETAL: Denies any gout, arthritis or cramps.  NEUROLOGIC: Denies CVA, TIA, headache, migraine, seizure, memory loss.   PSYCHIATRIC: Denies anxiety, insomnia, substance abuse, alcohol abuse.   PHYSICAL EXAMINATION:  VITAL SIGNS: Temperature 98.3, pulse 84, respiratory rate 20, blood pressure 142/73, saturating 100% on room air.  GENERAL: Elderly female who looks comfortable, in no apparent distress.  HEENT: Head atraumatic, normocephalic. Pupils equally reactive to light. Pink conjunctivae. Anicteric sclerae. Dry oral mucosa.  NECK: Supple. No thyromegaly. No JVD.  CHEST: Good air entry bilaterally. No wheezes, rales or rhonchi in the left lung, has rales in the right lung.  CARDIOVASCULAR: S1, S2 heard. No rubs, murmurs or gallops.  ABDOMEN: Soft, nontender, nondistended. Bowel sounds present.  EXTREMITIES: No edema. No clubbing. No cyanosis. Dorsalis pedis pulses +2 bilaterally. Has left AV fistula with bruit.  CHEST: The patient has right chest Perma-Cath.  PSYCHIATRIC: Appropriate affect. Awake, alert x3. Intact judgment and insight.  NEUROLOGIC: Cranial nerves grossly intact. Motor 5 out of 5. No focal deficits.  MUSCULOSKELETAL: No joint effusion or erythema could be appreciated.   PERTINENT LABORATORY DATA: Glucose 109, BUN 10, creatinine 4.12, sodium 137, potassium 4.1, chloride 97, CO2 32. ALT 11, AST 13, alkaline phosphatase 79. White blood cells 5.1, hemoglobin 11.9, hematocrit 35.9, platelets 152. Lactic acid 1.2. CT abdomen and pelvis: Gas and fluid distended right hemicolon with a relative transition in the mid transverse colon, where there may be an apple-core lesion resulting in partial obstruction, recommend colonoscopy, and right hemicolon wall thickening and stranding, pericolonic inflammatory changes consistent with nonspecific colitis and sigmoid colonic diverticulosis with a small amount of pericolonic inflammatory changes similar to prior finding, might be sequela of acute or chronic diverticulitis.   ASSESSMENT AND PLAN:  1. Abdominal pain. This is due to colonic obstruction from colon  cancer. The patient will be kept n.p.o. Will consult surgical service and GI. She will be kept n.p.o. As well, it might be less likely due to diverticulitis, so meanwhile she will be covered with IV Cipro and Flagyl until she is seen by consult, and I will follow their recommendation regarding discontinuing antibiotic or not. She does not appear to be in total obstruction. She has no significant nausea or vomiting, so will hold on starting NG tube.  2. End-stage renal disease. Will consult nephrology to continue her hemodialysis Tuesday, Thursday and Saturday.  3. Colon cancer. Will consult oncology service.  4. Hypertension. Blood pressure acceptable off medications.  5. Congestive heart failure. The patient does not appear to be in exacerbation.  6. Hypothyroidism. Continue with Synthroid.  7. Chronic obstructive pulmonary disease. No wheezing. Will continue with Advair and p.r.n. albuterol.  8. Deep vein thrombosis prophylaxis. Subcutaneous heparin.   CODE STATUS: The patient reports she is DNR.   TOTAL TIME SPENT ON ADMISSION AND PATIENT CARE: 55 minutes.   ____________________________ Albertine Patricia, MD dse:OSi D: 09/12/2013 05:04:29 ET T: 09/12/2013 05:51:50 ET JOB#: [id number]  cc: Albertine Patricia, MD, <Dictator> Leotta Weingarten Graciela Husbands MD ELECTRONICALLY SIGNED 09/22/2013 5:55

## 2015-04-18 NOTE — Consult Note (Signed)
History of Present Illness:  Reason for Consult Patient has a history of carcinoma of splenic flexor Anemia, transfusion dependent Chronic renal failure   Patient was admitted with partial  bowel obstruction.   HPI   Patient is here for further evaluation and treatment consideration regarding carcinoma of the splenic flexure. She oevrall reports feeling well. She continue with dialysis on Tuesday, Thursday, and Saturday. She does report mild discomfort in the right lower quadrant, she has been using tylenol and reports it is effective. She also reports history of diverticulitis with very similar pain. She has had foods recently that somewhat "irritate" her. She denies any fever, chills, nausea, vomiting, diarrhea, cough, shortness of breath, or chest pain. has in the past refused any further surgical treatment.  However today willing to undergo bowel and resection.seen by surgeon  [place]:  Comments No family history of colorectal cancer, breast cancer, or ovarian cancer.   Comments Smoked 20 years ago.  Does not drink.   Additional Past Medical and Surgical History as mention above   Review of Systems:  General weakness  fatigue   Performance Status (ECOG) 1   HEENT no complaints   Lungs no complaints   Cardiac no complaints   GU On dialysis   Extremities no complaints   Skin no complaints   Neuro no complaints   Endocrine no complaints   Psych no complaints   Review of Systems   GI: Persistent nausea vomiting , constipation  NURSING NOTES: ED Vital Sign Flow Sheet:   17-Sep-14 04:50   Pulse Pulse: 84   Respirations Respirations: 20   SBP SBP: 142   DBP DBP: 73   Pain Scale (0-10) Pain Scale (0-10): Scale:0  NURSING NOTES: **Vital Signs.:   18-Sep-14 13:11   Vital Signs Type: Routine   Temperature Temperature (F): 98   Celsius: 36.6   Temperature Source: oral   Pulse Pulse: 78   Respirations Respirations: 19   Systolic BP Systolic BP: 299    Diastolic BP (mmHg) Diastolic BP (mmHg): 64   Mean BP: 76   Pulse Ox % Pulse Ox %: 94   Pulse Ox Activity Level: At rest   Oxygen Delivery: Room Air/ 21 %   Physical Exam:  General Patient is alert oriented not in any acute distress   HEENT: normal   Lungs: clear   Cardiac: regular rate, rhythm   Skin: intact   Musculoskeletal: swollen/deformed joints   Extremities: No edema, rash or cyanosis   Neuro: AAOx3  cranial nerves intact   Physical Exam Abdomen is distended.  Bowel sounds are increased.  Tenderness.  No palpable mass     colon cancer-diagnosed 2013:    diverticulosis:    history of colonic polyps:    copd:    hypothyroidism:    hypertension:    seasonal allergies:    Pneumothorax spontaneous:    CHF:    renal failure:    jaundice as a child:    emhysema:    Dialysis T,Th,Sat:    cataract surgery:    Hysterectomy - Total:    kidney biopsy:    Right breast biopsy:    Right Lung Surgery:    bilat dialysis shunts:    Pollen: SOB  Shellfish: Hives    Rena-Vite Vitamin B Complex with C and Folic Acid oral tablet: 1 tab(s) orally once a day, Status: Active, Quantity: 0, Refills: None   Tylenol 325 mg oral tablet: 2 tab(s) orally every 4 hours, As Needed -  for Headache, Status: Active, Quantity: 0, Refills: None   Benadryl 25 mg oral capsule: 25 milligram(s) orally , As Needed, Status: Active, Quantity: 0, Refills: None   Advair Diskus 100 mcg-50 mcg inhalation powder: 1 puff(s) inhaled 2 times a day, Status: Active, Quantity: 0, Refills: None   Combivent CFC free 100 mcg-20 mcg/inh inhalation aerosol: 1 puff(s) inhaled 4 times a day, As Needed, Status: Active, Quantity: 0, Refills: None   Synthroid 125 mcg (0.125 mg) tablet: 1 tab(s) orally once a day , Status: Active, Quantity: 30, Refills: None   Sensipar 30 mg tablet: 1 tab(s) orally once a day (at bedtime) , Status: Active, Quantity: 30, Refills: None   isosorbide  mononitrate 60 mg tablet, extended release: 1 tab(s) orally once a day (in the morning) , Status: Active, Quantity: 30, Refills: None  Laboratory Results: Routine Chem:  18-Sep-14 05:50   Phosphorus, Serum  7.2 (Result(s) reported on 13 Sep 2013 at 11:33AM.)  Glucose, Serum 78  BUN  26  Creatinine (comp)  7.17  Sodium, Serum  133  Potassium, Serum 4.5  Chloride, Serum  94  CO2, Serum 28  Calcium (Total), Serum 9.2  Anion Gap 11  Osmolality (calc) 270  eGFR (African American)  6  eGFR (Non-African American)  5 (eGFR values <75m/min/1.73 m2 may be an indication of chronic kidney disease (CKD). Calculated eGFR is useful in patients with stable renal function. The eGFR calculation will not be reliable in acutely ill patients when serum creatinine is changing rapidly. It is not useful in  patients on dialysis. 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  is admitted with  partial bowel obstruction this point in time patient is willing to undergo surgeryfailure on chronic dialysisdiscussed situation with patient's  surgeon. scan has been reviewed independently Plan:   .......  CC Referral:  cc: DEbony Cargo Dr. JMauricia Area CRamiro HarvestPA.  Primary care doctor Dr.  JLisette Grinder  Electronic Signatures: CJobe Gibbon(MD)  (Signed 18-Sep-14 18:04)  Authored: HISTORY OF PRESENT ILLNESS,  PFSH, ROS, NURSING NOTES, PE, PAST MEDICAL HISTORY, ALLERGIES, HOME MEDICATIONS, LABS, ASSESSMENT AND PLAN, CC Referring Physician   Last Updated: 18-Sep-14 18:04 by CJobe Gibbon(MD)

## 2015-04-18 NOTE — Discharge Summary (Signed)
PATIENT NAME:  [name], [name] MR#:  [id number] DATE OF BIRTH:  05-08-1941  DATE OF ADMISSION:  09/12/2013 DATE OF DISCHARGE:  09/27/2013  HISTORY OF PRESENT ILLNESS: [name] is a 74 year old black lady with a history of colon cancer that was diagnosed in 11/2012. She refused surgical intervention at that time. At the time of the present admission the patient presented with abdominal pain, nausea, vomiting and diarrhea. CT scan showed partial large bowel obstruction.   PAST MEDICAL HISTORY: Notable for end-stage renal disease with hemodialysis on Tuesday, Thursday, Saturday. The patient also had a history of hypertension, congestive heart failure, hypothyroidism, COPD, diverticulosis and colonic polyps.   PREVIOUS SURGICAL HISTORY: Included an AV fistula in the left upper extremity. The patient had also had a previous hysterectomy, breast biopsy, and renal biopsy. She had a previous right lung collapse was status post repair. She had had previous cataract surgery.   ALLERGIES: SHELLFISH.   MEDICATIONS AT THE TIME OF ADMISSION: Included: Renvela 800 mg 3 tablets 3 times daily with meals. Advair Diskus 100/50, 1 puff b.i.d. Combivent 2 puffs q.4 h. p.r.n. Synthroid 125 mcg daily, Sensipar 30 mg at bedtime and isosorbide 60 mg daily.   ADMISSION PHYSICAL EXAMINATION:  VITAL SIGNS: Revealed temperature 98.3, pulse 84, respiratory rate 20, and blood pressure 142/73.  IN GENERAL: This is an elderly, black lady who was uncomfortable. She was in no acute distress, however.   Exam was most notable for the abdomen that was soft, but there was diffuse tenderness. Bowel sounds were present. The patient was noted to have an AV fistula in the left upper extremity. She also had a PermaCath.   LABORATORY DATA: Admission CBC showed a hemoglobin of 11.9 with a hematocrit of 35.6. White count was 5100. Platelet count was 152,000. Admission comprehensive metabolic panel showed a blood sugar of 109, creatinine of  4.12, and an estimated GFR of 12. Lipase was 232. Blood cultures drawn on admission eventually showed no growth. CT scan of the abdomen showed findings consistent with mid transverse colon lesion causing obstruction of the right colon. There was also evidence of possible mild chronic diverticulitis in the sigmoid colon. There was severe atrophy of the kidneys. There was evidence of chronic interstitial lung disease with atelectasis.   HOSPITAL COURSE: The patient was admitted to the regular medical floor where she was treated with IV antibiotics for suspected diverticulitis. She was treated for essentially a week. She was seen in consultation by surgery and eventually underwent resection of the transverse and right colon on 09/20/2011. The patient's convalescence after that was rather slow but she was eventually converted to p.o. antibiotics and had her diet progress. At the time of discharge, she was tolerating her diet well. She was also ambulating without difficulty. During the patient's hospitalization, she was followed by nephrology and also had routine dialysis on Tuesday, Thursday, Saturday.   DISCHARGE DIAGNOSES: 1. Large bowel obstruction secondary to carcinoma of the colon.  2. Diverticulitis.  3. Chronic renal failure.  4. Hypertension with a history of congestive heart failure.   DISCHARGE MEDICATIONS: 1. Synthroid 125 mcg daily.  2. Sensipar 30 mg at bedtime.  3. Imdur 60 mg daily.  4. Advair 100/50 inhaler 1 puff b.i.d.  5. Combivent 1 puff 4 times a day.  6. Rena-Vite 1 tablet daily.  7. Benadryl 25 mg as needed.  8. Norco 325/5, 1 tablet every four hours as needed.   DISCHARGE DISPOSITION: The patient was discharged on a renal  failure diet with activity as tolerated.   FOLLOWUP: She is to be followed at home by home health. She is to see the surgeons in follow-up in 1 to 2 weeks.   ____________________________ Hewitt Blade Sarina Ser, MD jbw:sg D: 10/11/2013 07:55:35  ET T: 10/11/2013 09:16:39 ET JOB#: [id number]  cc: Hewitt Blade. Sarina Ser, MD, <Dictator>  Lottie Mussel III MD ELECTRONICALLY SIGNED 10/11/2013 17:18

## 2015-04-18 NOTE — Discharge Summary (Signed)
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   OTHER DIAGNOSES: 1.  Colonoscopic colon perforation, proximal to mid transverse colon, 5 to 10 cm proximal to cancer.  2.  End-stage renal disease, on hemodialysis.  3.  Anemia of end-stage renal disease.  4.  Hypertension.  5.  Congestive heart failure.  6.  Chronic obstructive pulmonary disease.  7.  Hypothyroidism.  8.  Status post hysterectomy.  9.  History of right pneumothorax.  10.  Cataracts.  11.  Morbid obesity.  PRINCIPAL PROCEDURE PERFORMED DURING THIS ADMISSION:  12/05/2012: exploratory laparotomy, enterolysis, repair of colon perforation and left subclavian vein central venous catheter placement.   HOSPITAL COURSE: The patient was diagnosed with pneumoperitoneum and taken to the operating room on admission and underwent the above-mentioned procedure for the above-mentioned diagnoses. On postoperative day 1, temperature maximum was 100.2. The patient's pain was controlled with limited amounts of morphine. She felt better than she felt preoperatively. She did not have any nausea but had not passed any flatus and was hungry. She was kept n.p.o. and nephrology was consulted so that she can receive hemodialysis during her hospitalization.  Her white blood cell count was 7000 and her hematocrit was 31%. The following day she had an increased oxygen requirement overnight and was tachycardic on high flow nasal cannula. She was dialyzed and hospitalist consult was obtained and she improved. She was advised to undergo transverse colectomy while she still had a prepped colon but despite that advice, she did not wish to consent to surgery because of her medical comorbidities. Therefore her diet was advanced and by postoperative day  4, she was afebrile, feeling better, pulse was about 105, oxygen saturation 98% to 93% on 2 liters nasal cannula.  The abdomen was nondistended, very mildly tympanitic and hematocrit was 24%. She was continued on her clear liquid diet and placed on Entereg.  She was transfused 2 units of packed red blood cells on hemodialysis during her next hemodialysis and her diet was advanced and she tolerated that diet and was having bowel movements and was therefore discharged home from the hospital. The patient was given instructions to follow with me to consider surgery in 1 to 2 months for her unresectable colon cancer.     ____________________________ Consuela Mimes, MD wfm:ct D: 12/31/2012 20:41:24 ET T: 01/01/2013 08:35:45 ET JOB#: [id number]  cc: Consuela Mimes, MD, <Dictator> Consuela Mimes MD ELECTRONICALLY SIGNED 01/01/2013 20:35

## 2015-04-18 NOTE — Op Note (Signed)
PATIENT NAME:  [name], [name] MR#:  [id number] DATE OF BIRTH:  1941-11-12  DATE OF PROCEDURE:  09/05/2013  PREOPERATIVE DIAGNOSES: 1.  Poorly functioning dialysis access.  2.  End-stage renal disease requiring hemodialysis.   POSTOPERATIVE DIAGNOSES: 1.  Poorly functioning dialysis access.  2.  End-stage renal disease requiring hemodialysis.   PROCEDURES PERFORMED: 1.  Contrast injection of left arm basilic transposition.  2.  Percutaneous transluminal angioplasty to 7 mm, left arm venous outflow basilic transposition.  3.  Placement of an 8 x 50 mm flared FLAIR stent venous outflow for failed angioplasty.   SURGEON: Hortencia Pilar, MD.   SEDATION: Versed 3 mg plus fentanyl 100 mcg administered IV. Continuous ECG, pulse oximetry and cardiopulmonary monitoring is performed throughout the entire procedure by the interventional radiology nurse. Total sedation time: 45 minutes.   ACCESS: A 7 French sheath, antegrade direction, left basilic transposition.   CONTRAST USED: Isovue 35 mL.   FLUOROSCOPY TIME: 2.1 minutes.   INDICATIONS: [name] is a 74 year old woman maintained on hemodialysis via a left arm basilic transposition. Recently they have been having increasing problems with her dialysis runs and worsening of her dialysis parameters. Noninvasive studies as well as physical examination suggest high-grade stenosis of the venous outflow. She is therefore undergoing angiography with the hope for intervention to salvage her dialysis access. Risks and benefits were reviewed. All questions answered. The patient agrees to proceed.   DESCRIPTION OF PROCEDURE: The patient is taken to the special procedure suite, placed in the supine position. After adequate sedation is achieved, she is positioned supine and her left arm is extended palm upward. The left arm is prepped and draped in sterile fashion. Appropriate timeout is called.   The fistula is then accessed with a micropuncture needle,  near the anastomosis in an antegrade direction after 1% lidocaine has been infiltrated in the soft tissues. Microwire followed by microsheath, J-wire followed by a 6 French sheath. Hand injection of contrast is then used to demonstrate the fistula itself as well as the central veins. String sign is noted in the fistula near the venous outflow i.e. confluence with the axillary vein and 3000 units of heparin is given.   Magic torque wire is then advanced, magnified images obtained and a 7 x 6  balloon is inflated to 16 atmospheres for a little over 1 minute. Follow-up angiography demonstrates greater than 60% residual stenosis, and it is elected to stent this area based on failed angioplasty.   An 8 x 50 flared FLAIR stent is then opened onto the field. It is advanced over the wire, after removing the 6 French sheath, passed across the lesion and deployed without difficulty. Deployment is quite accurate. The delivery system was removed and a 7 [place] sheath is advanced over the wire. An 8 x 40 [place] balloon is then advanced over the wire through the flared stent and it is inflated to 24 atmospheres before the lesion yields.  It is left there for 45 seconds. Follow-up angiography now demonstrates a fully expanded, FLAIR stent with rapid flow of contrast. Clinically, there is now a continuous thrill and significant improvement in the exam of the fistula.   Pursestring suture of 4-0 Monocryl was placed around the sheath. The sheath is removed, light pressure is held, and there are no immediate complications.   INTERPRETATION: Initial views of the fistula demonstrate a string sign at the venous outflow portion as noted above. Central veins are widely patent. Reflux image with compression of  the graft demonstrates the arterial anastomosis is widely patent. Following angioplasty, there is minimal improvement, and therefore FLAIR stent is deployed and posted using an 8 mm balloon with excellent result.   SUMMARY:  Successful salvage of left arm basilic transposition.    ____________________________ Katha Cabal, MD ggs:dp D: 09/05/2013 11:23:23 ET T: 09/05/2013 11:46:12 ET JOB#: [id number]  cc: Katha Cabal, MD, <Dictator> Katha Cabal MD ELECTRONICALLY SIGNED 09/07/2013 9:14

## 2015-04-18 NOTE — Consult Note (Signed)
PATIENT NAME:  [name], [name] MR#:  [id number] DATE OF BIRTH:  11-25-1941  DATE OF CONSULTATION:  09/12/2013  CONSULTING PHYSICIAN: Harrell Gave A. Eh Sauseda, M.D.   REASON FOR CONSULTATION: Abdominal pain, transverse colon cancer, and cecal thickening and pericolonic stranding.   HISTORY OF PRESENT ILLNESS: [name] is a 74 year old well-known to myself with multiple medical problems who presented with approximately 3 to 4 days of suprapubic pain and it extended bilaterally, it has since resolved. Also some nausea and spitting up, but no vomiting. She says she has not had this pain before. Otherwise been doing fine until then and eating well, having regular bowel movements. Has had a known history of diverticulosis. She is well-known to [place] in that she underwent emergency laparotomy with repair of a colon perforation from colonoscopy. At that time, she had a polyp, which was biopsied, which came back as an invasive cancer. We have seen her postoperatively and recommended colectomy, she had deferred colectomy and was lost to follow-up. She now would like to have colectomy for cancer and I have spoken to her extensively about the benefits of performing this, and if not that she may eventually obstruct and require emergency surgery. Otherwise, no current fevers, chills, night sweats, shortness of breath, cough, chest pain, nausea, vomiting, diarrhea, constipation, dysuria, hematuria.   PAST MEDICAL HISTORY: 1. Transverse colon cancer diagnosed at the end of last year.  2. History of echogenic colon perforation status post exploratory laparotomy and closer of polyp   perforation.  3. End stage renal disease.  4. Hypertension.  5. CHF.  6. Hypothyroidism.  7. COPD.  8. Diverticulosis.  9. History of left upper extremity AV fistula and temporary dialysis catheter.  10. Renal biopsy. 11. Breast biopsy.  12. Hysterectomy.  13. History of lung collapse.   14. Cataract surgery.   ALLERGIES: SHELLFISH  WITH URTICARIA AND HIVES.   FAMILY HISTORY: No family history of colon cancer, polyps, father with bone cancer.   SOCIAL HISTORY: Resides at home, is here with daughter. No alcohol. No drug use.   HOME MEDICATIONS: 1. Renvela. 2. Advair Diskus.  3. Combivent.  4. Synthroid.  5. Sensipar.  6. Isosorbide.   REVIEW OF SYSTEMS: Twelve-point review of systems is obtained. Pertinent positives and negatives as above.   PHYSICAL EXAMINATION: VITAL SIGNS: Temperature 97.6, pulse 76, blood pressure 126/77, respirations 18, 97% on room air.  GENERAL: No acute distress. Alert and oriented x3.  HEAD: Normocephalic, atraumatic.  EYES: No scleral icterus. No conjunctivitis.  FACE: No obvious facial trauma. Normal external nose. Normal external ears.  CHEST: Lungs clear to auscultation, moving air well.  HEART: Regular rate and rhythm. No murmurs, rub or gallops.  ABDOMEN: Soft, nontender, nondistended. Has a midline upper abdominal scar.  EXTREMITIES: Moves all extremities well. Strength 5/5.  NEUROLOGIC: Cranial nerves II through XII grossly intact.   LABORATORY DATA: As follows: White cell count of 5.1, hemoglobin 11.9, hematocrit of 335.6, BUN 10, creatinine of 4.12, bicarbonate 32.   CT scan shows a thickened proximal colon with some pneumatosis, as well as a distal, looks like a transverse colon mass. Appendix is normal without stranding inflammation. No obvious small bowel distention. No obvious high grade obstruction. Right colon is mildly distended.   ASSESSMENT AND PLAN: [name] is a pleasant 74 year old with a known history of colon cancer who presents with abdominal pain. She has thickening, pneumatosis, and some stranding around her cecum. I feel that this is due to the fact that  it is relatively only slightly distended. This is not due to her partially obstructing mass but possibly colitis. Would continue antibiotics. My fear if we were to remove the colon mass by itself, the  inflammation in the right colon may prevent safe anastomosis and is still at risk for injuring due to whatever is causing the thickening. In addition, I feel that a extended right hemicolectomy, which would be more safe, would also place the patient is high risk for diarrhea due to the extent at the right hemicolectomy that would be needed to resect this mass with appropriate margins. Would recommend treating colitis for now and performing an elective colectomy approximately 1 to 2 weeks after her colitis has improved. However, if she worsens we will perform likely transverse colectomy before that. We will continue to follow.   ____________________________ Glena Norfolk Pearson Picou, MD cal:sg D: 09/12/2013 13:46:33 ET T: 09/12/2013 14:09:17 ET JOB#: [id number]  cc: Harrell Gave A. Jacson Rapaport, MD, <Dictator> Floyde Parkins MD ELECTRONICALLY SIGNED 09/15/2013 19:20

## 2015-04-18 NOTE — Consult Note (Signed)
Chief Complaint:  Subjective/Chief Complaint seen for nausea vomiting and diarrhea, continues with sx.  mild generalized abdominal discomfort   VITAL SIGNS/ANCILLARY NOTES: **Vital Signs.:   20-Oct-14 11:27  Vital Signs Type Routine  Temperature Temperature (F) 97.6  Celsius 36.4  Temperature Source oral  Respirations Respirations 20  Systolic BP Systolic BP 233  Diastolic BP (mmHg) Diastolic BP (mmHg) 64  Mean BP 95  Pulse Ox % Pulse Ox % 98  Pulse Ox Activity Level  At rest  Oxygen Delivery Room Air/ 21 %  *Intake and Output.:   20-Oct-14 04:12  Stool  greenish, watery, loose and large in amount    08:45  Stool  small liquid stool    09:29  Stool  small liquid dark green stool    17:30  Stool  liquid dark green stool   Brief Assessment:  Cardiac Regular   Respiratory clear BS   Gastrointestinal details normal Soft  Nondistended  bowel sounds hyperactive, mild  discomfort extending midline up abdomen.   Lab Results: Hepatic:  20-Oct-14 04:44   Bilirubin, Total 0.4  Alkaline Phosphatase 78  SGPT (ALT) 13  SGOT (AST) 20  Total Protein, Serum  6.1  Albumin, Serum  2.7  Routine Chem:  20-Oct-14 04:44   Magnesium, Serum 1.9 (1.8-2.4 THERAPEUTIC RANGE: 4-7 mg/dL TOXIC: > 10 mg/dL  -----------------------)  Glucose, Serum  112  BUN 8  Creatinine (comp)  4.49  Sodium, Serum 140  Potassium, Serum 3.8  Chloride, Serum 106  CO2, Serum 26  Calcium (Total), Serum 9.2  Osmolality (calc) 278  eGFR (African American)  11  eGFR (Non-African American)  9 (eGFR values <83m/min/1.73 m2 may be an indication of chronic kidney disease (CKD). Calculated eGFR is useful in patients with stable renal function. The eGFR calculation will not be reliable in acutely ill patients when serum creatinine is changing rapidly. It is not useful in  patients on dialysis. The eGFR calculation may not be applicable to patients at the low and high extremes of body sizes, pregnant women,  and vegetarians.)  Anion Gap 8  Phosphorus, Serum  1.3 (Result(s) reported on 15 Oct 2013 at 05:15AM.)  Routine Hem:  20-Oct-14 04:44   WBC (CBC) 4.9  RBC (CBC)  2.57  Hemoglobin (CBC)  8.4  Hematocrit (CBC)  24.9  Platelet Count (CBC) 225  MCV 97  MCH 32.7  MCHC 33.6  RDW  20.7  Neutrophil % 68.1  Lymphocyte % 23.7  Monocyte % 6.5  Eosinophil % 1.5  Basophil % 0.2  Neutrophil # 3.4  Lymphocyte # 1.2  Monocyte # 0.3  Eosinophil # 0.1  Basophil # 0.0 (Result(s) reported on 15 Oct 2013 at 05:15AM.)   Radiology Results: CT:    12-Oct-14 20:47, CT Abdomen and Pelvis Without Contrast  CT Abdomen and Pelvis Without Contrast   REASON FOR EXAM:    (1) abdominal pain, vomiting, recent colectomy; (2)   abdominal pain, vomiting, re  COMMENTS:       PROCEDURE: CT  - CT ABDOMEN AND PELVIS W0  - Oct 07 2013  8:47PM     RESULT: History: Pain.    Comparison Study: CT 09/12/2013.    Findings: Liver normal. Spleen normal. Mild edema noted about the   duodenum and the pancreatic head. Duodenitis with possible mild   pancreatitis could present this fashion. Postsurgical changes noted in   the right upper quadrant consistent with prior partial colectomy. No   biliary distention.  Adrenals normal. Innumerable  renal cysts. Renal atrophy.    Aorta normal caliber. Renal vascular disease.    Appendectomy. No bowel distention. Mild edema noted about the sigmoid   colon. Diverticulosis is noted. Diverticulitis cannot be excluded.     No acute bony abnormality. Atelectasis lung bases. Heart size normal.   Coronary artery disease.    IMPRESSION:   1. Findings consistent with duodenitis. Mild pancreatitis cannot be   excluded.  2. Mildchanges of diverticulitis.  3. Prior partial colectomy. No bowel obstruction or free air.    Verified By: Osa Craver, M.D., MD   Assessment/Plan:  Assessment/Plan:  Assessment 1) nausea vomiting-multifactorial-duodenitis, c. diff 2) diarrhea-c.  diff-on abs.  Transit time is more rapid through the colon due to post operative anatomy.  Continue current abx, may take a longer course.  Will start cholestyramine tomorrow.   Plan as above.   Electronic Signatures: Loistine Simas (MD)  (Signed 20-Oct-14 22:27)  Authored: Chief Complaint, VITAL SIGNS/ANCILLARY NOTES, Brief Assessment, Lab Results, Radiology Results, Assessment/Plan   Last Updated: 20-Oct-14 22:27 by Loistine Simas (MD)

## 2015-04-18 NOTE — Consult Note (Signed)
Brief Consult Note: Diagnosis: Colon Cancer.  Anemia.  End-stage Renal disease.   Consult note dictated.   Discussed with Attending MD.   Comments: Patient's presentation discussed with Dr. Verdie Shire.  Reviewed CT scan results in detail.  Known history of colon cancer with no intervention having been done in the past.  Orginally diagnosed 10/2012. CT scan of abdomen and pelvis with contrast 09/22/2013 revealing evidence of 2.2 cm mass involving transverse colon. Discussed with patient about recommendation to proceed with colonoscopy to allow direct luminal evlauation of area to assess area versus proceeding forward with surgical consultation.  Patient declines colonoscopy and wishes to have surgical consultation and surgery to be performed based in her known history as well as current findings on CT scan.  Order placed.  Will continue to follow, but very little from GI standpoint to offer at this point unless patient changes her mind about colonoscopy or warranted by surgery prior to surgery.  Electronic Signatures: Payton Emerald (NP)  (Signed 17-Sep-14 12:46)  Authored: Brief Consult Note   Last Updated: 17-Sep-14 12:46 by Payton Emerald (NP)

## 2015-04-18 NOTE — Op Note (Signed)
PATIENT NAME:  [name], CANEPA MR#:  [id number] DATE OF BIRTH:  22-Jun-1941  DATE OF PROCEDURE:  07/13/2013  PREOPERATIVE DIAGNOSES: 1.  End-stage renal disease requiring hemodialysis.  2.  Complication dialysis device with large hematoma formation left brachiocephalic fistula.   POSTOPERATIVE DIAGNOSES: 1.  End-stage renal disease requiring hemodialysis.  2.  Complication dialysis device with large hematoma formation left brachiocephalic fistula.  PROCEDURES PERFORMED: 1.  Contrast injection, right jugular vein.  2.  Placement of cuffed tunneled dialysis catheter right jugular approach with ultrasound and fluoroscopic guidance.   SURGEON:  Dr. Delana Meyer  SEDATION: Versed 3 mg plus fentanyl 100 mcg administered IV. Continuous ECG, pulse oximetry and cardiopulmonary monitoring was performed throughout the entire procedure by the interventional radiology nurse.  TOTAL SEDATION TIME:  45 minutes   ACCESS:  Right IJ.   FLUOROSCOPY TIME:  1.5 minutes   CONTRAST USED:  Isovue 5 mL.   INDICATIONS: [name] is a 74 year old woman who presented to the office urgently yesterday from dialysis. Physical examination, as well as noninvasive studies, demonstrated a very large hematoma of the left brachiocephalic fistula, which rendered this fistula unable to be accessed and she is, therefore, undergoing placement of a catheter while the hematoma heals and resolves. Risks and benefits, as well as alternative therapies, were reviewed with the patient. All questions are answered. The patient agrees to proceed.   DESCRIPTION OF PROCEDURE:  The patient is taken to special procedures and placed in the supine position. After adequate sedation is achieved, the right neck and chest wall are prepped and draped in the sterile fashion. Ultrasound is placed in a sterile sleeve. Ultrasound is utilized secondary to lack of appropriate landmarks and to avoid vascular injury. Under direct ultrasound visualization, the  jugular vein is identified. It is echolucent and homogeneous, indicating patency. It is also compressible indicating patency. This is at the base of the neck. Image is recorded for the permanent record. Under direct ultrasound visualization, Seldinger needle is inserted. A good blood return is noted and the J-wire is advanced; however, it will not advance past the level of the clavicle by fluoroscopy. Ultimately, wire was lost and re-accessing of the vein was required, this time with a micropuncture needle, microwire followed by micro sheath. 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   Using a floppy microwire, this lesion was negotiated and the microsheath was advanced beyond this level. J-wire was then subsequently advanced down under fluoroscopic guidance into the inferior vena cava. Using the dilators from the 19 cm tip-to-cuff Cannon catheter to dilate this lesion, the stricture of the jugular vein was dilated degree that allowed for passage of the peel-away sheath. The catheter was then woven over the wire and advanced through the peel-away sheath and the peel-away sheath and wire were removed under fluoroscopic guidance. The tips were then adjusted for proper positioning, exit site selected, small incision made, tunneling device is passed subcutaneously and then the catheter was pulled subcutaneously and final positioning was performed under fluoroscopic guidance. Catheter was then transected, hub connected. Both lumens aspirate and flush easily. Under fluoro, there is a smooth contour to the catheter. Both lumens were packed with 5000 units of heparin and the catheter was secured to the skin of the chest wall with a 0 silk, and then the counterincision closed with 4-0 Monocryl and Dermabond and sterile dressing was applied. The  patient tolerated the procedure well and there were no  immediate complications. Sponge and needle counts were correct and she was taken to the recovery area in excellent condition.    ____________________________ Katha Cabal, MD ggs:ce D: 07/13/2013 09:03:37 ET T: 07/13/2013 10:50:01 ET JOB#: [id number]  cc: Katha Cabal, MD, <Dictator> John B. Sarina Ser, MD  Katha Cabal MD ELECTRONICALLY SIGNED 07/14/2013 11:18

## 2015-04-18 NOTE — Op Note (Signed)
PATIENT NAME:  [name], [name] MR#:  [id number] DATE OF BIRTH:  May 18, 1941  DATE OF PROCEDURE:  09/19/2013  PREOPERATIVE DIAGNOSES:  1.  Transverse colon cancer.  2.  Large bowel obstruction.  3.  Ischemic right colon.   POSTOPERATIVE DIAGNOSES: 1.  Transverse colon cancer.  2.  Large bowel obstruction.  3.  Ischemic right colon.   PROCEDURE: Exploratory laparotomy, right hemicolectomy, transverse colectomy and splenic flexure takedown.   SURGEON: Richard E. Burt Knack, M.D.   ANESTHESIA: General with endotracheal tube.   ASSISTANT: Janifer Adie, PA-S,  Bettey Costa, PA-S and Thea Silversmith, PA-S.   INDICATIONS: This is a patient with a history of a colonic perforation following colonoscopy of the transverse colon where a polyp was partially or near completely removed that showed invasive adenocarcinoma. That area was tattooed back in December, at which time her colonic perforation was repaired primarily. She has done well after that surgery; however, she now has signs of abdominal pain and a large bowel obstruction with possible transition zone proximal to the area of prior colonoscopic perforation or nearby. She also has signs on CT scan suggesting ischemic right colon with thickened cecum and xtra luminal air.   We discussed the rationale for surgery, the options of observation, risks of bleeding, infection, recurrence, the possibility of a near subtotal colectomy necessitating permanent ileostomy, the risks of an anastomotic leak requiring additional operation. This was all reviewed for she and her family in the preop holding area. They understood and agreed to proceed.   FINDINGS: Tattooing in the mid transverse colon to the left of midline, somewhat thickened cecum and a palpable mass in the area of the prior repair in the transverse colon to the right of midline. Specimen was labeled right colon with transverse colon and a long suture labeling the area of palpable mass and a short  suture labeling the area of tattooing.   DESCRIPTION OF PROCEDURE: The patient was induced to general nesthesia. VTE prophylaxis was in place. A Foley catheter was placed. Although this patient is likely anuric with end stage renal disease on dialysis, we wanted to empty the bladder completely. The catheter was removed before the start of the case. She was given IV antibiotics. VTE prophylaxis was in place. She was then prepped and draped in a sterile fashion. A midline incision was utilized to open and explore the abdominal cavity. Multiple adhesions were taken down along the midline and lateral to open the abdominal cavity, and once this was performed a full adhesiolysis was completed and then exploration including palpation of an otherwise normal liver, a gallbladder without stones and otherwise normal abdominal exam was performed.  The area of tattooing was immediately identifiable as was a palpable mass in the transverse colon proximal to the area of tattooing and the right colon changes appeared to be somewhat chronic. There was no free fluid in the cecal area.  The right colon was taken down laterally in a Kocher maneuver and then further dissection across the transverse colon was performed. Most of this was done with electrocautery. The large vessels were ligated with 0 Vicryl and divided.   Once it was decided that there was enough colon to perform an anastomosis, the splenic flexure was taken down to aid in allowing for a tension-free anastomosis and then the transverse colon was divided distal to the area of tattooing with a GIA. GIA was fired across the terminal ileum as well. Vessels were taken down between clamps and ligated with either  single or double 2-0 Vicryls and then the specimen was sent off for examination after labeling it as above.   An anastomosis was performed using a handsewn technique, 2 layers with an inner layer of 3-0 Monocryl followed by 3-0 silk sutures for the outer  layer. The mesentery was closed to prevent an internal hernia with figure-of-eight 3-0 silks.   At this point, the area was irrigated with copious amounts of normal saline. There was no sign of bleeding or bowel injury. The wound was then closed by placing a piece of Seprafilm and running #1 PDS. Marcaine was infiltrated in skin and subcutaneous tissues and then skin staples were placed.   A sterile dressing was placed. The patient tolerated the procedure well. There were no complications. She was taken to the recovery room in stable condition. Sponge, lap and needle counts were correct multiple times and the estimated blood loss was 200 mL.   ____________________________ Jerrol Banana. Burt Knack, MD rec:aw D: 09/19/2013 93:79:02 ET T: 09/19/2013 10:21:57 ET JOB#: [id number]  cc: Jerrol Banana. Burt Knack, MD, <Dictator> Florene Glen MD ELECTRONICALLY SIGNED 09/19/2013 18:54

## 2015-04-18 NOTE — Consult Note (Signed)
PATIENT NAME:  [name], [name] MR#:  [id number] DATE OF BIRTH:  January 15, 1941  DATE OF CONSULTATION:  09/12/2013  REFERRING PHYSICIAN:  Delorise Shiner K. Choksi, MD CONSULTING PHYSICIAN:  For Meredith Cory, NP, and [name]. Meredith Cruise, MD.  REASON FOR CONSULTATION: Colon cancer.   HISTORY OF PRESENT ILLNESS: [name] is a 74 year old female who presented to [facility] Emergency Room with a chief complaint of abdominal pain, worsening over the past 3 to 4 days. [name] is well known to myself as well as Dr. Candace Reynolds as she had been seen in our office on 07/09/2013 for followup after having a colonoscopy done as well as finding of heme-positive stool. Colonoscopy was performed by Dr. Verdie Shire on 11/20/2012 with a finding of diverticulosis as well as 2 possible sessile polyps being found in the transverse colon as well as the ascending colon. Polyps were retrieved. Polyp also in the cecum was retrieved. One polyp at 70 cm proximal to the anus was medium in size, which was also retrieved. The ascending colonic polyp pathology report resulted in tubular adenomatous. Hot snare at 70 cm revealed evidence of at least intramucosal adenocarcinoma. Colonoscopy was repeated on 12/04/2012 due to her history of colonic polyps as well as malignant neoplasm in the colon, requiring further biopsies to be obtained to allow differentiation and whether intramucosal versus further invasiveness. One polyp was found in the ileocecal valve area, medium in size, was retrieved. Pathology of cecal polyp showed fragments of tubulovillous adenoma. Splenic flexure polyp biopsy was invasive adenocarcinoma. Unfortunately, she suffered a perforation at the time of her second colonoscopy and underwent emergency surgery by Dr. Leanora Cover. Area was repaired, and no further complications were noted. When she presented to our office on 07/09/2013, she declined surgery or any further evaluation at that time. The patient has been under the care of Dr. Oliva Bustard for this  concern as well as anemia. She was hospitalized 04/24/2013 for C. difficile colitis.   Abdominal pain at this time has been present in mid abdomen. Described as a crampy pain, sharp in quality, intermittent, associated chills. No fevers documented. Complaints of nausea. No vomiting. Her last bowel movement at home was this past Sunday. States it was "a good bowel movement." She does have a history of diverticulosis as well. She had a CT scan of abdomen and pelvis done on admission which showed findings consistent with mid transverse colon lesion causing obstruction in the right colon with pneumatosis and some mild pericolonic inflammation. Appendix is distended. Findings concerning for underlying apple-core malignancy in the transverse colon with a length measuring 2.2 cm. Diverticulosis is noted, with possible mild chronic diverticulitis in the sigmoid colon. Some mildly distended loops of the small bowel were noted, without evidence of small bowel obstruction. Severe atrophy of the kidneys, renal cystic disease noted. The patient does receive dialysis 3 times a week on Tuesdays, Thursdays and Saturdays. Also findings of chronic interstitial lung disease, with areas of atelectasis and/or fibrosis along some calcified right pleural plaques.   Since the patient has had a CT scan done yesterday evening, she has had 3 watery bowel movement thus far today, brownish in color. Does feel abdominal girth size has decreased since this morning. The pain has lessened.    PAST MEDICAL HISTORY:  1. End-stage renal disease, hemodialysis Tuesdays, Thursdays and Saturdays.  2. Hypertension.  3. Congestive heart failure.  4. Hypothyroidism.  5. COPD.  6. Colonic polyps.  7. Diverticulosis. 8. Colon cancer diagnosed in November  2013.   PAST SURGICAL HISTORY:  1. Left upper extremity AV fistula.  2. Renal biopsy.  3. Breast biopsy.  4. Hysterectomy.  5. Right lung collapse status post repair.  6. Cataract  surgery. 7. Colonic surgery, status post colonic perforation in December 2013.   FAMILY HISTORY: No family history of colon cancer or colonic polyps. Father with history of bone cancer.   SOCIAL HISTORY: Resides with her husband. Remote tobacco use, quit 20 years ago. No alcohol.   HOME MEDICATIONS:  1. Renvela 800 mg 3 tablets 3 times a day with meals.  2. Advair Diskus 100/50 one puff b.i.d.  3. Combivent inhaler as directed as needed. 4. Synthroid 125 mcg orally daily. 5. Sensipar 30 mg once daily at bedtime.  6. Isosorbide mononitrate 60 mg extended release in the morning.   ALLERGIES: SHELLFISH CAUSES URTICARIA AND HIVES.   REVIEW OF SYSTEMS:  CONSTITUTIONAL: Denies any fevers. Significant for chills, fatigue, weakness. Denies any weight gain or weight loss.  EYES: No blurred vision, double vision, glaucoma.  ENT: No tinnitus, ear pain, hearing loss, epistaxis or discharge.  RESPIRATORY: No coughing, no wheezing, hemoptysis.  CARDIOVASCULAR: No chest pain, heart palpitations, syncopal episodes.  GASTROINTESTINAL: See HPI.  GENITOURINARY: Denies any dysuria, hematuria. Known history though, the patient is anuric due to end-stage renal disease.  ENDOCRINE: No polydipsia, polyuria, heat or cold intolerance.  HEMATOLOGIC: Denies easy bleeding. Known history of anemia.  INTEGUMENTARY: No rashes. No lesions.  MUSCULOSKELETAL: No arthralgias or myalgias.  NEUROLOGICAL: No history of CVA or TIA.   PHYSICAL EXAMINATION:  VITAL SIGNS: Temperature is 97.6 with a pulse of 76, respirations are 18, blood pressure is 126/77, pulse oximetry is 97%.  GENERAL: Well-developed, well-nourished, very pleasant 74 year old African-American female, no acute distress noted. Appears to be resting comfortably in bed. Daughter at bedside.  HEENT: Normocephalic, atraumatic. Pupils equal, round and reactive to light. Conjunctivae clear. Sclerae anicteric.  NECK: Supple. Trachea midline. CARDIOVASCULAR:  Regular rhythm, S1, S2. No murmurs, no gallops.  PULMONARY: Symmetric rise and fall of chest. Slightly decreased breath sounds in the lower lobes with scattered rhonchi.  ABDOMEN: Soft, nondistended. Hypoactive bowel sounds noted, left upper quadrant, essentially absent in other quadrants. Mild discomfort in lateral aspect of abdomen on both sides. No bruits.  RECTAL: Deferred.  MUSCULOSKELETAL: Moving all 4 extremities. No contractures or clubbing.  EXTREMITIES: No edema.  PSYCHIATRIC: Alert and oriented x4. Memory grossly intact. Appropriate affect and mood.  NEUROLOGICAL: No gross neurological deficits.   LABORATORY AND DIAGNOSTIC DATA: Chemistry panel on admission yesterday: Glucose is 109, creatinine is 4.12, chloride is 97, eGFR is 12, otherwise within normal limits. Hepatic panel: AST of 13, ALT is 11, otherwise within normal limits. CBC: Hemoglobin is 11.9 with RDW elevated at 17.4, otherwise within normal limits. Blood cultures x2: No growth in 8 to 12 hours. Lactic acid is 12. CT scan of abdomen and pelvis with contrast: Results as noted under history.   IMPRESSION:  1. Colon cancer.  2. Anemia. 3. End-stage renal disease.   PLAN: The patient's presentation was discussed with Dr. Verdie Shire. Reviewed CT scan results in detail. Known history of colon cancer, with no previous intervention in the past. Diagnosed November 2013. CT scan of abdomen and pelvis with contrast on 09/12/2013 revealing evidence of a 2.2 cm mass involving transverse colon. Discussed with the patient recommendations of proceeding forward with a colonoscopy to allow direct luminal evaluation of area versus surgical consultation ad evaluation at this time  based on these findings and her known history. The patient declines proceeding forward with a colonoscopy at this point, wishing to have surgical consultation done. Wishing to proceed forward with surgery at this time. Order placed for surgery consult. Will continue to  follow, but very little from a GI standpoint of additional interventions to offer at this time other than, if the patient changes her mind, to proceed forward with a colonoscopy if surgery is not an option at this point.   These services provided by Payton Emerald, MS, APRN, [facility], FNP, under collaborative agreement with Dr. Verdie Shire.  ____________________________ Payton Emerald, NP dsh:OSi D: 09/12/2013 12:44:35 ET T: 09/12/2013 13:33:49 ET JOB#: [id number]  cc: Payton Emerald, NP, <Dictator> Payton Emerald MD ELECTRONICALLY SIGNED 09/12/2013 16:48

## 2015-04-18 NOTE — Consult Note (Signed)
Chief Complaint:  Subjective/Chief Complaint patietn feeling some better today, 2 watery stools, but no abdominal pain, tolerating po, minimal nausea, no emesis   Brief Assessment:  Cardiac Regular   Respiratory clear BS   Gastrointestinal details normal Soft  Nontender  Nondistended  No masses palpable   Lab Results: Hepatic:  21-Oct-14 11:08   Albumin, Serum  2.7  Routine Chem:  21-Oct-14 11:08   Magnesium, Serum 2.1 (1.8-2.4 THERAPEUTIC RANGE: 4-7 mg/dL TOXIC: > 10 mg/dL  -----------------------)  Glucose, Serum  125  BUN 15  Creatinine (comp)  6.22  Sodium, Serum 140  Potassium, Serum 4.0  Chloride, Serum  108  CO2, Serum 25  Calcium (Total), Serum 9.3  Phosphorus, Serum  2.0  Anion Gap 7  Osmolality (calc) 282  eGFR (African American)  7  eGFR (Non-African American)  6 (eGFR values <71m/min/1.73 m2 may be an indication of chronic kidney disease (CKD). Calculated eGFR is useful in patients with stable renal function. The eGFR calculation will not be reliable in acutely ill patients when serum creatinine is changing rapidly. It is not useful in  patients on dialysis. The eGFR calculation may not be applicable to patients at the low and high extremes of body sizes, pregnant women, and vegetarians.)  Routine Hem:  21-Oct-14 11:08   WBC (CBC) 6.3  RBC (CBC)  2.57  Hemoglobin (CBC)  8.5  Hematocrit (CBC)  24.8  Platelet Count (CBC) 220  MCV 97  MCH 33.0  MCHC 34.2  RDW  20.9  Neutrophil % 80.0  Lymphocyte % 13.3  Monocyte % 4.9  Eosinophil % 0.8  Basophil % 1.0  Neutrophil # 5.0  Lymphocyte #  0.8  Monocyte # 0.3  Eosinophil # 0.1  Basophil # 0.1 (Result(s) reported on 16 Oct 2013 at 11:35AM.)   Assessment/Plan:  Assessment/Plan:  Assessment 1) c. diff diarrhea-complicated by recent partial colectomy, with shorter transit times, likely less abx contact time.  Denies abdominal pain currently, no nausea. Once the diarrhea seems some less, would repeat  c. diff, and if negative consider adding some cholestyramine.  Care will be needed in timing of abx and cholestyramine as the latter can interfere with the former.   Plan as above.   Electronic Signatures: SLoistine Simas(MD)  (Signed 21-Oct-14 18:20)  Authored: Chief Complaint, Brief Assessment, Lab Results, Assessment/Plan   Last Updated: 21-Oct-14 18:20 by SLoistine Simas(MD)

## 2015-04-18 NOTE — Op Note (Signed)
PATIENT NAME:  MANHATTAN, MCCUEN MR#:  [id number] DATE OF BIRTH:  Dec 16, 1941  DATE OF PROCEDURE:  12/07/2013  PREOPERATIVE DIAGNOSIS: End-stage renal disease with functional permanent dialysis access and no longer needing PermCath.   POSTOPERATIVE DIAGNOSIS: End-stage renal disease with functional permanent dialysis access longer needing PermCath.   PROCEDURE: Removal of right jugular PermCath.   SURGEON: [place], PA-C.   ANESTHESIA: Local.   ESTIMATED BLOOD LOSS: Minimal.   INDICATION FOR THE PROCEDURE: The patient is a 74 year old female with end-stage renal disease. Her fistula is functional and she no longer needs PermCath. This will be removed.   DESCRIPTION OF THE PROCEDURE: The patient is brought to the vascular interventional radiology area and positioned supine. The right neck and chest and existing catheter were sterilely prepped and draped and a sterile surgical field was created. The area was locally anesthetized copiously with 1% lidocaine. Hemostats were used to help dissect out the cuff. An 11 blade was used to transect the fibrous sheath connected to the cuff. The catheter was then removed in its entirety without difficulty with gentle traction. Pressure was held at the base of the neck. Sterile dressing was placed. The patient tolerated the procedure well.    ____________________________ Marin Shutter. Malasia Torain, PA-C cnh:aw D: 12/07/2013 10:10:09 ET T: 12/07/2013 10:18:24 ET JOB#: [id number]  cc: Marin Shutter. Francesa Eugenio, PA-C, <Dictator> Zeba PA ELECTRONICALLY SIGNED 12/17/2013 11:21

## 2015-04-18 NOTE — Discharge Summary (Signed)
PATIENT NAME:  [name], [name] MR#:  [id number] DATE OF BIRTH:  October 29, 1941  DATE OF ADMISSION:  10/07/2013 DATE OF DISCHARGE:  10/19/2013  HISTORY OF PRESENT ILLNESS: [name] is a 74 year old black lady who recently been hospitalized for large bowel obstruction secondary to carcinoma of the colon. She underwent a resection of the right and transverse colon. The patient had been at home recovering when she again developed abdominal pain with nausea and vomiting. She presented to the Emergency Room where a repeat CT scan of the abdomen showed duodenitis, possible pancreatitis and diverticulitis of the sigmoid colon. The patient was therefore readmitted.   PAST MEDICAL HISTORY: The patient's past medical history, medications, and allergies are basically unchanged since her last admission.   The patient's admission laboratory work included a CBC which showed a hemoglobin of 10.1, white count 9500 and platelet count of 358,000. Met B showed a sodium of 132 with a potassium of 3.2, a chloride of 96 and a bicarbonate of 33. BUN was 18 with a creatinine of 5.33. Lipase was 267.   EKG showed atrial fibrillation with a slightly uncontrolled heart rate.   HOSPITAL COURSE: The patient was admitted to the regular medical floor where she was started on IV antibiotics for her acute diverticulitis. While she was in the hospital, she did continue on her thrice weekly hemodialysis treatments. The patient's hospitalization was complicated by the fact that she developed profuse diarrhea. She did test positive for C. diff and was placed on isolation. The patient completed her course of IV antibiotics while in the hospital for acute diverticulitis. She was, however, sent home on p.o. vancomycin for her persistent C. difficile colitis. A repeat C. diff determination done prior to discharge was negative.   DISCHARGE DIAGNOSES: 1.  Acute diverticulitis of the sigmoid colon.  2.  Clostridium difficile colitis.  3.  Chronic  renal failure.   DISCHARGE MEDICATIONS: 1.  Synthroid 125 mcg daily.  2.  Sensipar 30 mg daily.  3.  Imdur 60 mg daily.  4.  Advair inhaler 1 puff b.i.d.  5.  Combivent 1 puff q.i.d.  6.  Rena-Vite 1 tablet daily.  7.  Reglan 5 mg b.i.d. before meals.  8.  Cobalamine  patch one patch every three days.  9.  Vancomycin 250 mg every six hours.   DISCHARGE DISPOSITION: The patient is discharged on a renal failure diet with activity as tolerated. The patient is to be followed up in the office in 1 to 2 weeks.   ____________________________ Hewitt Blade Sarina Ser, MD jbw:cc D: 11/05/2013 20:24:22 ET T: 11/05/2013 21:39:08 ET JOB#: [id number]  cc: Meredith Reichmann B. Sarina Ser, MD, <Dictator> Meredith Mussel III MD ELECTRONICALLY SIGNED 11/07/2013 17:35

## 2015-04-19 NOTE — Discharge Summary (Signed)
PATIENT NAME:  [name], [name] MR#:  [id number] DATE OF BIRTH:  [birth year]  DATE OF ADMISSION:  07/27/2014 DATE OF DISCHARGE:  07/28/2014  PRIMARY CARE PHYSICIAN:  John B. Sarina Ser, MD.  DISCHARGE DIAGNOSES:  1.  Gastroenteritis.  2.  End-stage renal disease.  3.  Hypertension.  4.  Chronic obstructive pulmonary disease.  5.  History of diastolic congestive heart failure.  6.  Hypothyroidism.  7.  History of colon cancer.   DISCHARGE MEDICATIONS: These are the same as her admission medications and include:  1.  Synthroid 125 mcg daily.  2.  Sensipar 30 mg daily.  3.  Prednisone 20 mg 2 tablets daily.  4.  Metoclopramide 5 mg b.i.d.  5.  Imdur 60 mg daily.  6.  Combivent 100 mcg/200 mcg inhaler 1 puff q.i.d. as needed.  7.  Advair 100/50 one puff b.i.d.   HISTORY AND PHYSICAL: This is a 74 year old female with a history of end-stage renal disease, hypertension, CHF, who presented with nausea, vomiting, diarrhea for the prior 3 days. She reported watery diarrhea. She denied abdominal pain, denied fever or chills. On presentation, she was found to be hemodynamically stable. Laboratory values were notable for end-stage renal disease and mild anemia. Abdominal x-ray showed no obstructive bowel gas pattern.  C. difficile stool was obtained in the ED and this was negative.   HOSPITAL COURSE: The patient was admitted for acute gastroenteritis and finding of dehydration. She was evaluated by nephrology due to dehydration. They elected not to give dialysis the day she presented. She was given gentle IV fluids. She was given Zofran for nausea. She was given Imodium for diarrhea. She had stable laboratory studies. She felt improved by the following morning. She was evaluated by physical therapy and felt to be safe for discharge home.   DISCHARGE INSTRUCTIONS:  1.  The patient is to follow up with dialysis center for planned dialysis the day after discharge.      2.  Patient is to follow up with  Dr. Gilford Rile in 2-3 weeks.    ____________________________ A. Lavone Orn, MD ams:lt D: 07/28/2014 09:37:55 ET T: 07/28/2014 10:26:02 ET JOB#: [id number]  cc: A. Lavone Orn, MD, <Dictator> John B. Sarina Ser, MD Gracy Bruins Chivas Notz MD ELECTRONICALLY SIGNED 07/29/2014 21:49

## 2015-04-19 NOTE — Discharge Summary (Signed)
PATIENT NAME:  [name], MCKELVIN MR#:  [id number] DATE OF BIRTH:  24-Feb-1941  DATE OF ADMISSION:  04/06/2014 DATE OF DISCHARGE:  04/10/2014  Ms. Derenzo is a 74 year old dialysis patient who was at dialysis and noted to have a persistent cough. She was sent to the Emergency Room where she was found to have a pulse ox of 83% on room air was therefore admitted for further evaluation.   The patient's past medical history was notable for end-stage renal disease with dialysis on Tuesday, Thursday, Saturday. The patient also had a history of hypertension, diastolic congestive heart failure, emphysema, hypothyroidism and previous resection of a colon cancer.   The patient was noted to be allergic to shellfish and pollen.   MEDICATIONS: Prior to admission were not listed by the admitting physician.   ADMISSION PHYSICAL EXAMINATION: As described by the admitting physician, revealed a temperature of 97.7, a pulse of 89, a blood pressure of 168/75, respiratory rate 21 and pulse ox of 83% on room air. The patient was noted to have a persistent dry cough. She also had audible wheezing. There was no JVD or ankle edema. Auscultation of the chest revealed poor air exchange. She had wheezing throughout. She had no rhonchi or rales. Cardiac exam was unremarkable. She did have 2+ stasis edema of the lower extremities.   Admission CBC showed a white count of 3900, hemoglobin of 10.8 with hematocrit of 33.6  and a platelet count of 181,000. Metabolic panel showed a blood sugar of 76. BUN was 8 with a creatinine of 3.12. Sodium was 136, potassium 3.5, chloride 97 and CO2 of 34. Calcium was 8.9. Albumin was 3.3. Liver function studies were normal. Chest x-ray showed minimal right basilar density consistent with scarring and/or atelectasis.   The patient was admitted to the regular medical floor where she was started on a pulmonary toilet of SVNs, IV steroids and IV antibiotics. She did receive dialysis while she was in the  hospital. On admission, she had a history of diarrhea for several days and she was screened for C. diff. On admission, she was not felt to have congestive heart failure. Stool culture on admission did not show active infection. Stool for C. diff, however, was positive. The patient was placed on antibiotics to cover her for C. diff. At the time of discharge, the patient had been weaned to room air. Pulse ox was 92% on room air at the time of discharge. The patient remained afebrile. She was ambulated without difficulty.   DISCHARGE DIAGNOSES: 1.  Acute on chronic respiratory failure secondary to chronic obstructive pulmonary disease flare.  2.  Clostridium diff colitis.  3.  Chronic renal failure.   DISCHARGE DISPOSITION: The patient was discharged on a renal diet with activity as tolerated. No changes were made in her preadmission medications. She was sent out on prednisone 20 mg 2 tablets daily until she is seen back in the office. She was also sent out on Flagyl 500 mg every 8 hours and Levaquin 250 mg every 24 hours for an additional 10 days.   The patient is to be followed up in the office in approximately 2 weeks.    ____________________________ Hewitt Blade Sarina Ser, MD jbw:dmm D: 04/24/2014 17:41:13 ET T: 04/24/2014 20:30:34 ET JOB#: [id number]  cc: Hewitt Blade. Sarina Ser, MD, <Dictator> Lottie Mussel III MD ELECTRONICALLY SIGNED 04/25/2014 7:53

## 2015-04-19 NOTE — H&P (Signed)
PATIENT NAME:  [name], [name] MR#:  [id number] DATE OF BIRTH:  22-Feb-1941  DATE OF ADMISSION:  07/27/2014  PRIMARY CARE PHYSICIAN: John B. Sarina Ser, MD  CHIEF COMPLAINT: Nausea, vomiting, diarrhea for 3 days.   HISTORY OF PRESENT ILLNESS: A 74 year old female with a history of ESRD, hypertension, CHF, presented to the ED with nausea, vomiting and diarrhea for 3 days. The patient is alert, awake, oriented, in no acute distress. The patient said that she started to have nausea, vomiting and diarrhea for the past 3 days. Diarrhea is watery and multiple times, but no melena or bloody stool. The patient also has generalized weakness. The patient feels sick, unable to get hemodialysis today, but the patient denies any fever or chills. No headache or dizziness. No chest pain or cough.   PAST MEDICAL HISTORY:  1. ESRD.  2. Hypertension.  3. Emphysema.  4. Diastolic CHF. 5. Hypothyroidism.  6. Colon cancer, status post resection.   SOCIAL HISTORY: No smoking or drinking or illicit drugs.   PAST SURGICAL HISTORY:  1. LASIK surgery.  2. Cataracts.  3. Colon surgery.  4. Hysterectomy.  5. Left arm fistula surgery.   ALLERGIES: SHELLFISH AND POLLEN.   FAMILY HISTORY: Father died of lung cancer. Mother died of heart disease.   HOME MEDICATIONS:  1. Synthroid 125 mcg p.o. daily. 2. Sensipar 30 mg p.o. once a day at bedtime.  3. Prednisone 20 mg p.o. 2 tablets once a day. 4. Metoclopramide 5 mg p.o. b.i.d.  5. Imdur 60 mg p.o. once a day.  6. Homatropine/hydrocodone 1.5 mg/5 mg per 5 mL oral syrup 5 mL q.6 hours p.r.n. for cough. 7. Combivent CFC free 100 mcg/20 mcg inhalation 1 puff 4 times daily p.r.n. 8. Advair 100 mcg/50 mcg inhalation powder 1 puff twice a day.   REVIEW OF SYSTEMS:  CONSTITUTIONAL: The patient denies any fever or chills. No headache or dizziness, but has generalized weakness.  EYES: No double vision or blurred vision. ENT: No postnasal drip, slurred speech or  dysphagia.  CARDIOVASCULAR: No chest pain, palpitation, orthopnea or nocturnal dyspnea. No leg edema.  PULMONARY: No cough, sputum, shortness of breath or hematemesis.  GASTROINTESTINAL: Positive for nausea, vomiting, diarrhea. No melena or bloody stool.  GENITOURINARY: No dysuria, hematuria or incontinence.  SKIN: No rash or jaundice.  NEUROLOGY: No syncope, loss of consciousness or seizure.  ENDOCRINOLOGY: No polyuria, polydipsia, heat or cold intolerance.  HEMATOLOGY: No easy bruising or bleeding.   PHYSICAL EXAMINATION:  VITAL SIGNS: Temperature 97.5, blood pressure 133/71, pulse 88, oxygen saturation 90% on room air.  GENERAL: The patient is alert, awake, oriented, in no acute distress.  HEENT: Pupils round and equally reactive to light and accommodation. Dry oral mucosa. Clear oropharynx.  NECK: Supple. No JVD or carotid bruits. No lymphadenopathy. No thyromegaly. CARDIOVASCULAR: S1, S2, regular rate and rhythm. No murmurs or gallops.  PULMONARY: Bilateral air entry. No wheezing or rales. No use of accessory muscles to breathe.  ABDOMEN: Soft. No distention or tenderness. No organomegaly. Bowel sounds present.  EXTREMITIES: No edema, clubbing or cyanosis. No calf tenderness. Bilateral pedal pulses present.  SKIN: No rash or jaundice.  NEUROLOGIC: A and O x3. No focal deficit. Power 5/5. Sensation intact.   LABORATORY DATA: Glucose 86, BUN 34, creatinine 7.69, sodium 134, potassium 4.5, chloride 97, bicarbonate 25. WBC 13.9, hemoglobin 10.2, platelets 261. Abdominal x-ray: No obstructive bowel gas pattern. Lipase 148. C. difficile negative.   IMPRESSIONS:  1. Possible acute gastroenteritis.  2. Dehydration.  3. End-stage renal disease.  4. Hypertension.  5. Leukocytosis.  6. Anemia.   PLAN OF TREATMENT:  1. The patient will be placed for observation. The patient was treated with IV fluid in ED. We will give Zofran p.r.n. and follow up CBC.  2. Will get a nephrology consult for  hemodialysis.  3. Continue the patient's home medication.  4. Physical therapy evaluation.  CODE STATUS: The patient wants DNR status.   I discussed the patient's condition and plan of treatment with the patient and the patient's daughter.   TIME SPENT: About 55 minutes.   ____________________________ Demetrios Loll, MD qc:lb D: 07/27/2014 12:44:09 ET T: 07/27/2014 13:04:43 ET JOB#: [id number]  cc: Demetrios Loll, MD, <Dictator> Demetrios Loll MD ELECTRONICALLY SIGNED 07/27/2014 14:20

## 2015-04-19 NOTE — H&P (Signed)
PATIENT NAME:  [name], CHARTER MR#:  [id number] DATE OF BIRTH:  July 09, 1941  DATE OF ADMISSION:  04/06/2014  PRIMARY CARE PHYSICIAN:  Lisette Grinder, MD  CHIEF COMPLAINT:  Cough.   HISTORY OF PRESENT ILLNESS: This is a 74 year old female who was over at dialysis today. She had full dialysis. She was advised to get a chest x-ray for her cough. Her cough has been going on since last Thursday. Unable to stop coughing. She has been short of breath, yellow phlegm. She did have diarrhea last night 7 times. No blood in the bowel movements. No fever, chills or sweats. No nausea, vomiting, or abdominal pain. She is coughing up yellow phlegm. No blood in the phlegm. In the ER, she was found to have a pulse oximetry of 83% on room air and hospitalist services were contacted for further evaluation for COPD exacerbation. Patient also had audible wheeze.   PAST MEDICAL HISTORY: End-stage renal disease on dialysis Tuesday, Thursday, Saturday. Emphysema, hypertension, history of diastolic CHF, hypothyroidism, colon cancer, status post resection.   PAST SURGICAL HISTORY:  Lasik surgery and cataracts, colon surgery, hysterectomy, left arm fistula.   ALLERGIES:  SHELLFISH AND POLLEN.  MEDICATIONS:  Medication list is still being confirmed at this point by pharmacy tech. List is incomplete at this time of dictation. It should be completed within the next 10 minutes.    SOCIAL HISTORY: Quit smoking 25 years ago. No alcohol. No drug use. Lives with her husband and daughter.   FAMILY HISTORY: Father died of lung cancer. Mother died of heart disease.   REVIEW OF SYSTEMS:   CONSTITUTIONAL: No fever, chills or sweats. No weight gain. No weight loss. No weakness or fatigue.  EYES: She used to wear glasses in the past but since her eye surgery no glasses at this point. EARS, NOSE AND THROAT:  Positive for postnasal drip and a nosebleed the other day. No sore throat. No difficulty swallowing.  CARDIOVASCULAR: Positive for  chest pain.  RESPIRATORY: Positive for shortness of breath. Positive for cough, yellow phlegm. No hemoptysis.  GASTROINTESTINAL: No nausea. No vomiting. No abdominal pain. Positive for diarrhea. No bright red blood per rectum. No melena.  GENITOURINARY: No urination or hematuria.   MUSCULOSKELETAL: No joint pain.  INTEGUMENT:  Positive for rash on the foot.  NEUROLOGIC: No fainting or blackouts.  PSYCHIATRIC: No anxiety or depression.  ENDOCRINE: Positive for hypothyroidism.  HEMATOLOGIC AND LYMPHATIC:   No anemia.   PHYSICAL EXAMINATION:  VITAL SIGNS: On presentation to the Emergency Room included a temperature 97.7, pulse 89, blood pressure 168/75, pulse oximetry 83% on room air, respirations 21.  GENERAL: The patient constantly coughing during the entire exam. Audible wheeze heard.  EYES: Pupils equal, round, and reactive to light. Extraocular muscles intact. No nystagmus. EARS, NOSE, MOUTH AND THROAT:  Tympanic membranes: No erythema. Nasal mucosa: No erythema. Throat: No erythema, no exudate seen. Lips and gums: No lesions.  NECK: No JVD. No bruits. No lymphadenopathy. No thyromegaly. No thyroid nodules palpated.  RESPIRATORY: Poor air entry bilaterally. Positive wheeze throughout the entire lung field. No use of accessory muscles to breathe.  CARDIOVASCULAR SYSTEM: S1 and S2 are normal. No gallops, rubs, or murmurs heard. Carotid upstroke 2+ bilaterally. No bruits.  EXTREMITIES: Dorsalis pedis pulses 1+ bilaterally, 2+ edema of the lower extremity.  ABDOMEN: Soft, nontender. No organomegaly, splenomegaly. Normoactive bowel sounds. No masses felt.  LYMPHATIC: No lymph nodes in the neck.  MUSCULOSKELETAL: No clubbing, edema or cyanosis.  SKIN: No  rashes or ulcers seen.  NEUROLOGIC: Cranial nerves II- XII grossly intact. Deep tendon reflexes 2+ bilateral lower extremities.  PSYCHIATRIC: The patient is oriented to person, place, and time.   LABORATORY AND RADIOLOGICAL DATA: White blood  cell count 3.9, hemoglobin and hematocrit 10.8 and 33.6, platelet count 181,000. Glucose 76, BUN 8, creatinine 3.12, sodium 136, potassium 3.5, chloride 97, CO2 of 34, calcium 8.9. Liver function tests normal range. Albumin low at 3.3.   Chest x-ray showed minimal right basilar density consistent with scarring or subsegmental atelectasis.   ASSESSMENT AND PLAN:  1.  Acute respiratory failure with pulse oximetry of 83% on room air. We will give oxygen supplementation. On a few liters, her pulse oximetry is now 97%.  2.  COPD exacerbation with wheeze. We will give Solu-Medrol 60 mg IV q. 6 hours nebulizer treatments and Levaquin. Continue her usual inhalers.  3.  End-stage renal disease on hemodialysis. Had dialysis today. Would not be due until Tuesday.  4.  Diarrhea. We will send off stool studies including stool for leukocytes, stool comprehensive culture and stool for C. difficile.  5.  Hypertension. Last blood pressure is stable. Continue usual medications.  6.  Hypothyroidism. We will check with medication record and continue usual medications.  7.  History of CHF. No signs at this point in time. I think this is all COPD exacerbation.  8.  Anemia. We will leave it up to nephrologist on whether Epogen needs to be given during dialysis or not.   TIME SPENT:  On admission 55 minutes.   CODE STATUS: The patient is a DO NOT RESUSCITATE.   ____________________________ Tana Conch. Leslye Peer, MD rjw:tc D: 04/06/2014 13:47:18 ET T: 04/06/2014 15:15:28 ET JOB#: [id number]  cc: Tana Conch. Leslye Peer, MD, <Dictator>             John B. Sarina Ser, MD Marisue Brooklyn MD ELECTRONICALLY SIGNED 04/07/2014 19:21

## 2015-04-19 NOTE — Op Note (Signed)
PATIENT NAME:  [name], [name] MR#:  [id number] DATE OF BIRTH:  March 04, 1941  DATE OF PROCEDURE:  05/24/2014  PREOPERATIVE DIAGNOSES: 1.  Complication AV graft with hematoma formation.  2.  End-stage renal disease requiring hemodialysis.   POSTOPERATIVE DIAGNOSES:  1.  Complication AV graft with hematoma formation.  2.  End-stage renal disease requiring hemodialysis.   PROCEDURES PERFORMED: 1.  Contrast injection left arm brachial axillary dialysis graft.  2.  Percutaneous transluminal angioplasty of the venous portion, AV graft.  3.  Percutaneous transluminal angioplasty of the arterial portion, left arm AV graft.   SURGEON: Hortencia Pilar, M.D.   SEDATION: Versed 4 mg plus fentanyl 150 mcg administered IV. Continuous ECG, pulse oximetry and cardiopulmonary monitoring was performed throughout the entire procedure by the interventional radiology nurse. Total sedation time was 40 minutes.   ACCESS: A 6-French sheath, antegrade direction, which is flipped into the retrograde position for the 2nd half of the case. Therefore, this is a one-sheath procedure.   CONTRAST USED: Isovue 25 mL.   FLUOROSCOPY TIME: 3.5 minutes.   INDICATIONS: [name] is a 74 year old woman who presented to the office with increasing problems with her dialysis access and a large hematoma. Physical examination as well as noninvasive studies demonstrated several issues with her graft and she is now undergoing angiography with the hope for intervention. The risks and benefits are reviewed. All questions answered. The patient has agreed to proceed.   DESCRIPTION OF PROCEDURE: The patient is taken to the special procedure suite, placed in the supine position. After adequate sedation is achieved, she is positioned supine with her left arm extended palm upward. The left arm is prepped and draped in sterile fashion.   Lidocaine 1% is infiltrated in the soft tissues and access is obtained into the graft near the arterial  anastomosis. A J-wire is then advanced followed by a 6-French sheath. Hand injection of contrast demonstrates a high-grade stricture stenosis at the leading edge of a previously placed FLAIR stent. Central veins are widely patent. Then 3000 units of heparin is given. A Magic torque wire is advanced through the lesion and an 8 x 6 Dorado balloon is advanced across the lesion and inflated to 16 atmospheres for 2 minutes. Follow-up imaging demonstrates that there is now resolution of the stricture stenosis and improved flow of contrast.   Using a floppy Glidewire, the catheter is then flipped into the retrograde direction and the Glidewire and a catheter are advanced into the brachial artery and imaging is performed of the arterial portion. High-grade stricture stenosis is noted at the level of the anastomosis and a 7 x 6 balloon is advanced over this lesion and inflated to 14 atmospheres for 1 minute. Follow-up imaging demonstrates significant improvement with matching of the arterial anastomosis to the graft and resolution of the stricture.   Pursestring suture of 4-0 Monocryl is placed around the sheath, sheath is pulled, pressure is held, and there are no immediate complications.   INTERPRETATION: Contrast injection into the AV graft demonstrates 2 lesions, 1 at the arterial anastomosis extending over several centimeters and 1 at the leading edge of the previously placed FLAIR stent. Central veins are widely patent.   The angioplasty is then performed of the venous portion to 8 mm and to 7 mm of the arterial portion, as described above, with resolution of these lesions. There is now significantly improved flow throughout the AV access.   SUMMARY: Successful salvage of left arm brachial axillary dialysis graft, as  described above.  ____________________________ Katha Cabal, MD ggs:sb D: 05/24/2014 13:20:57 ET T: 05/24/2014 13:33:17 ET JOB#: [id number]  cc: Katha Cabal, MD, <Dictator> Dr.  Frances Maywood MD ELECTRONICALLY SIGNED 05/28/2014 12:30

## 2015-04-21 LAB — SURGICAL PATHOLOGY

## 2015-04-23 NOTE — H&P (Signed)
PATIENT NAME:  [name], [name] MR#:  [id number] DATE OF BIRTH:  08/23/1941  DATE OF ADMISSION:  12/27/2014  PRIMARY CARE PHYSICIAN: John B. Sarina Ser, MD at [facility].   PRIMARY GASTROENTEROLOGIST: Lupita Dawn. Candace Cruise, MD at [facility].   REFERRING EMERGENCY ROOM PHYSICIAN: Wells Guiles L. Lord, MD   CHIEF COMPLAINT: Vomiting dark-red blood and dark-tarry stools.   HISTORY OF PRESENT ILLNESS: This 74 year old woman with past medical history of end-stage renal disease on hemodialysis, hypertension, hyperlipidemia, who was just admitted from December 29th to December 31st for treatment of pneumonia presents today with vomiting of dark-red blood and dark-tarry stools. She reports that when she got home yesterday afternoon, she began to feel acutely nauseated and began vomiting. She has vomited 5 times. The last emesis being this afternoon. She also has had several very loose, dark-black, tarry stools. She has pain in the mid lower abdomen. She also describes pain at the bottom of her stomach. She has not been able to eat or drink anything. She does feel fatigued. She has not had any palpitations or syncope. No chest pain or shortness of breath. On Emergency Room evaluation, her blood pressure has gone from 156/60 yesterday to 99/36 today. Her hemoglobin has gone from 11.2 yesterday to 7.0 today.   PAST MEDICAL HISTORY:  1.  End-stage renal disease on hemodialysis for the past 13 years. She receives dialysis on Tuesdays, Thursdays, and Saturdays.  2.  Hypertension.  3.  Emphysema.  4.  Diastolic congestive heart failure.  5.  Hypothyroidism.  6.  History of colon cancer, status post resection.   PAST SURGICAL HISTORY:  1.  LASIK surgery.  2.  Cataracts.  3.  Colon surgery.  4.  Hysterectomy.  5.  Left arm fistula.   ALLERGIES: SHE IS ALLERGIC TO SHELLFISH AND POLLEN.   HOME MEDICATIONS:  1.  Synthroid 125 mcg 1 tablet daily.  2.  Sensipar 30 mg 1 tablet daily.  3.  Advair Diskus 100 mcg/50  mcg 1 puff inhaled 2 times a day.  4.  Combivent CFC Free 100 mcg/200 mcg inhalation 1 puff inhaled 4 times a day as needed.  5.  Metoclopramide 5 mg 1 tablet twice a day before meals.  6.  Isosorbide mononitrate 1 tablet once a day in the morning.  7.  Levaquin 250 mg 1 tablet daily for the next 10 days starting December 31st.   SOCIAL HISTORY: She quit smoking 20 years ago. Denies drinking alcohol or using illicit substances. She lives with her husband and one of her daughters. She does not have a power of attorney. She does wish to be a DNR. I discussed this with her with 2 of her daughters present. She does not want cardiac resuscitation.   FAMILY MEDICAL HISTORY: Positive for lung cancer in her father and heart disease in her mother.   REVIEW OF SYSTEMS:  CONSTITUTIONAL: Positive for fatigue and weakness. Negative for fevers and chills. No change in weight.  HEENT: No change in vision or hearing. No pain in the eyes or ears. No difficulty swallowing. No sore throat. No sinus congestion.  RESPIRATORY: No shortness of breath, coughing, wheezing, or hemoptysis.  CARDIOVASCULAR: No chest pain, palpitations, orthopnea, syncope.  GASTROINTESTINAL: Positive for nausea, vomiting, diarrhea, and abdominal pain. Positive for hematemesis, melena, diarrhea.  GENITOURINARY: No dysuria or frequency. She is oliguria.  SKIN: No new rashes or lesions.  NEUROLOGIC: No focal numbness or weakness. No headaches, seizure, or confusion.  HEMATOLOGIC: Positive for  GI bleeding. She also has bruising over her lower abdomen from injections during prior hospitalization.  PSYCHIATRIC: No uncontrolled anxiety or depression.   PHYSICAL EXAMINATION:  VITAL SIGNS: Temperature 98.3, pulse 83, respirations 18, blood pressure 99/36, oxygenation 96% on room air.  GENERAL: The patient looks acutely ill, very weak.  HEENT: Pupils are equal, round, and reactive to light. Conjunctivae are clear. Extraocular motion is intact.  Oral mucous membranes are dry. Good dentition. Posterior oropharynx is clear of exudate, erythema, or edema.  NECK: Trachea midline. No cervical lymphadenopathy.  RESPIRATORY: Lungs clear to auscultation bilaterally with good air movement. No respiratory distress.  CARDIAC: Regular rate and rhythm. There is a 3/6 systolic ejection murmur. She has trace peripheral edema. Peripheral pulses are 1+.  ABDOMEN: Soft. There is diffuse tenderness, but she localizes to the periumbilical area. No guarding, no mass, no rebound. Bowel sounds are decreased.  MUSCULOSKELETAL: No joint effusion. She is diffusely weak and does not participate fully in the musculoskeletal examination. She is able to move all 4 extremities spontaneously.  NEUROLOGIC: Cranial nerves II through XII are grossly intact. Strength and sensation are symmetrical bilaterally. She does have diffuse weakness and does not participate fully in the neurologic examination.  PSYCHIATRIC: The patient is alert. She is oriented to person, place, and time. She understands her current medical situation. She does not show any signs of uncontrolled depression or anxiety.   LABORATORY DATA: Sodium 143, potassium 4.2, chloride 100, bicarbonate 29, BUN 1, creatinine 5.23, glucose 98. Total protein 5.2, albumin 2.8, AST 10, ALT 11. Troponin 0.03. Hemoglobin 7.0, platelets 150,000, MCV is 100, white blood cells 4.8.   IMAGING: No imaging.   ASSESSMENT AND PLAN:  1.  Acute gastrointestinal bleed: This is likely an upper gastrointestinal bleed with hematemesis and melena. I have discussed the case with Dr. Gustavo Lah who will see the patient today. She was started on a Protonix drip. She is being admitted to the stepdown unit. She is nothing by mouth. We will monitor CBCs after transfusion of 2 units packed red blood cells. She reports that she would not be willing to undergo colonoscopy, but she would consider esophagogastroduodenoscopy. She has been informed that  potentially an esophagogastroduodenoscopy could stop her current bleeding and she would be willing to do this. She is not currently vomiting or having diarrhea at the moment.  2.  Acute anemia due to gastrointestinal bleeding: The patient's hemoglobin has gone from 11.2 yesterday to 7.0 today due to gastrointestinal bleed. She has been typed and screened for 3 units. She will be transfused 2 units of packed red blood cells. Check a CBC 1 hour after transfusion and has ended to ensure appropriate rise in hemoglobin.  3.  Relative hypotension: The patient was 156/60 yesterday and is now 99/40. This is likely due to volume loss with vomiting. We will hold off on isosorbide mononitrate for now. She will be getting 2 units of packed red blood cells, which should improve her blood pressure. She is being admitted to the stepdown unit.  4.  End-stage renal disease: I have consulted with nephrology for continuation of hemodialysis, which she needs Tuesdays, Thursdays, and Saturdays. She will need dialysis tomorrow. Electrolytes are stable at this time.  5.  Hypothyroidism: She is nothing by mouth. I will hold her Synthroid for today.  6.  Diastolic heart failure: No signs of heart failure exacerbation at this time.  7.  Prophylaxis: She is not on pharmacological deep vein thrombosis prophylaxis due to  gastrointestinal bleeding. She is on a Protonix drip.   TIME SPENT ON ADMISSION: 45 minutes.    ____________________________ Earleen Newport. Volanda Napoleon, MD cpw:ts D: 12/27/2014 22:29:13 ET T: 12/27/2014 22:46:23 ET JOB#: [id number]  cc: Earleen Newport. Volanda Napoleon, MD, <Dictator> Aldean Jewett MD ELECTRONICALLY SIGNED 12/29/2014 20:11

## 2015-04-23 NOTE — Consult Note (Signed)
Brief Consult Note: Diagnosis: hematemesis, melena.   Patient was seen by consultant.   Consult note dictated.   Recommend further assessment or treatment.   Discussed with Attending MD.   Comments: Please see full Gi consult [phone].  [name] discharged from hospital 2 d ago, aspiration PNA.  After going home she developed n/v and hematemesis, melena, returning to er last night.  She is hemodynamically stable, has now recieved 2 unit prbc, last emesis last night about 6pm.   On iv ppi drip.  Continue current.  EGD when clinically feasible, Dr Rayann Heman on call this weekend.  Electronic Signatures: Loistine Simas (MD)  (Signed 02-Jan-16 09:45)  Authored: Brief Consult Note   Last Updated: 02-Jan-16 09:45 by Loistine Simas (MD)

## 2015-04-23 NOTE — H&P (Signed)
PATIENT NAME:  [name], [name] MR#:  [id number] DATE OF BIRTH:  1941-02-04  DATE OF ADMISSION:  12/23/2014  PRIMARY CARE PHYSICIAN:  John B. Sarina Ser, MD   REFERRING PHYSICIAN:  Verdia Kuba. Paduchowski, MD  CHIEF COMPLAINT:  Right-sided chest pain, shortness of breath.   HISTORY OF PRESENT ILLNESS:  [name] is a 74 year old female with a history of end-stage renal disease on hemodialysis, hypertension, and hyperlipidemia, who had an episode of vomiting and diarrhea after drinking Nepro. After the vomiting, the patient started to have chest pain on the right side of the chest. The patient only had a mild cough, however, started to experience severe shortness of breath. The patient's last dialysis was on Sunday. Concerning the shortness of breath, she came to the Emergency Department. She did not have any fever. She did not have any elevated white blood cell count. Chest x-ray done in the Emergency Department showed concern about aspiration pneumonia, showing interval increase in heterogenous right middle and lower lobe lung opacities, which may be secondary to infection or aspiration. The patient received clindamycin and levofloxacin in the Emergency Department.   PAST MEDICAL HISTORY: 1.  End-stage renal disease, on hemodialysis for the last 13 years. The patient gets dialysis Tuesday, Thursday, and Saturday.  2.  Hypertension.  3.  Emphysema.  4.  Diastolic congestive heart failure.  5.  Hypothyroidism.  6.  History of colon cancer status post resection.   PAST SURGICAL HISTORY: 1.  Lasik surgery.  2.  Cataracts.  3.  Colon surgery.  4.  Hysterectomy.  5.  Left arm fistula surgery.   ALLERGIES TO:  SHELLFISH AND POLLEN.   HOME MEDICATIONS: 1.  Synthroid 125 mcg once a day.  2.  Sensipar 30 mg once a day.  3.  Metoclopramide 5 mg 2 times a day.  4.  Imdur 1 tablet once a day.  5.  Combivent 1 puff 4 times a day.  6.  Advair Diskus 1 puff 2 times a day.   SOCIAL HISTORY:  Quit  smoking about 20 years back, prior to that smoked heavily. Denies drinking alcohol or using illicit drugs. Walks to the bathroom.   FAMILY HISTORY:  Father died of lung cancer. Mother died of heart disease.   REVIEW OF SYSTEMS: CONSTITUTIONAL:  Experiencing generalized weakness.  EYES:  No change in vision.  EARS, NOSE, AND THROAT:  No change in hearing.  RESPIRATORY:  Has been experiencing shortness of breath with exertion.  CARDIOVASCULAR:  Has chest pain on the right side of the chest and lower extremity edema.  GASTROINTESTINAL:  Had nausea and diarrhea.  GENITOURINARY:  Does not make any urine.  SKIN:  No rash or lesions.  NEUROLOGIC:  No weakness or numbness in any part of the body.  HEMATOLOGIC:  No easy bruising or bleeding.   PHYSICAL EXAMINATION: GENERAL:  This is a well-built, well-nourished, age-appropriate female lying down in the bed, not in distress.  VITAL SIGNS:  Temperature 97.7, pulse 64, blood pressure 156/61, respiratory rate 17, oxygen saturation is 96% on 2 liters of oxygen.  HEENT:  Head is normocephalic and atraumatic. There is no scleral icterus. Conjunctivae are normal. Pupils are equal and react to light. Mucous membranes are moist. No pharyngeal erythema.  NECK:  Supple. No lymphadenopathy. No JVD. No carotid bruit. No thyromegaly.  CHEST:  Has no focal tenderness. Decreased breath sounds in the right lower lobe. Mild coarse breath sounds.  HEART:  S1, S2 regular. No murmurs  are heard.  ABDOMEN:  Bowel sounds present. Soft, nontender, nondistended. No hepatosplenomegaly.  EXTREMITIES:  There is 2+ pitting edema in the right leg and 1+ pitting edema in the left.  SKIN:  No rash or lesions.  MUSCULOSKELETAL:  Good range of motion in all of the extremities.  NEUROLOGIC:  The patient is alert and oriented to place, person, and time. Cranial nerves II through XII are intact. Motor is 5/5 in upper and lower extremities.   LABORATORY DATA:  CBC:  WBC of 3.1,  hemoglobin 10.9, and platelet count of 133,000.   BUN is 16, creatinine 5.63. BNP is 120,000. Troponin is less than 0.02. The rest of all the values are within normal limits.   Chest x-ray shows showed interval increase in heterogenous right mid-to-lower lung opacities, which may be secondary to aspiration infection. Recommended followup CT or chest x-ray in 2 to 3 months.   ASSESSMENT AND PLAN:  [name] comes to the Emergency Department with chest pain and shortness of breath and is found to have right mid and lower lobe infiltrates.   1.  Aspiration pneumonia. The patient might have aspirated while vomiting; however, the patient denies having any choking episode and usually does not have any dysphagia. I do not think the patient needs to be kept n.p.o. and needs to do the swallow evaluation; however, we will treat with Zosyn. The patient is afebrile and does not have any elevated white blood cell count.  2.  End-stage renal disease, on hemodialysis. Consulted nephrology.  3.  Nausea and vomiting. She had only one episode. The patient currently denies having any nausea. We will continue to follow up. She did not have any episodes of diarrhea since she came to the Emergency Department.  4.  Hypertension, currently well controlled.  5.  Hypothyroidism. Continue the Synthroid.  6.  Keep the patient on deep vein thrombosis prophylaxis with heparin.   TIME SPENT:  55 minutes.   ____________________________ Monica Becton, MD pv:nb D: 12/24/2014 00:45:14 ET T: 12/24/2014 01:35:10 ET JOB#: [id number]  cc: Monica Becton, MD, <Dictator> John B. Sarina Ser, MD Grier Mitts Camdyn Laden MD ELECTRONICALLY SIGNED 01/04/2015 23:42

## 2015-04-23 NOTE — Consult Note (Signed)
Details:   - GI Note:  Hemodynamically stable. No hematemesisi since PTA.  - cont to follow Hgb, transfuse as necessary - clear liquids ok.  - likely EGD tomorrow - npo after midnight.   Electronic Signatures: Arther Dames (MD)  (Signed 02-Jan-16 14:51)  Authored: Details   Last Updated: 02-Jan-16 14:51 by Arther Dames (MD)

## 2015-04-23 NOTE — Consult Note (Signed)
PATIENT NAME:  [name], [name] MR#:  [id number] DATE OF BIRTH:  Jan 30, 1941  DATE OF CONSULTATION:  12/28/2014  REFERRING PHYSICIAN:  Barnetta Chapel P. Volanda Napoleon, MD CONSULTING PHYSICIAN:  Lollie Sails, MD  REASON FOR CONSULTATION: Hematemesis and melena.   HISTORY OF PRESENT ILLNESS: [name] is a 74 year old female who presented to the Emergency Room yesterday with hematemesis and some black stools. She had a recent admission for right middle lobe pneumonia, apparently possibly aspiration in nature, on 12/24/2014. She was treated for the aspiration pneumonitis and went home on 12/26/2014. She states that she was discharged in the morning and, when she got home in the afternoon, she began to have problems with nausea and vomiting and some lower abdominal pain. She then had an emesis of blood. She states that the first time she threw up it was bloody and bright red. She has no previous history of peptic ulcer disease. She does get some occasional heartburn for which she takes [place]. She is not on an acid prophylaxis of any sort. She denies any dysphagia. She is an end-stage renal patient on hemodialysis Tuesday/Thursday/Saturdays. She did have a colonoscopy x 2 in December 2013; there was apparently a complication for patient, requiring 2 surgeries. The patient denies taking any aspirin or NSAID products. She does occasionally take Tylenol. Her last emesis was at 6:00 p.m. yesterday evening. She did have a dark stool this morning at 5:00 a.m. She has been hemodynamically stable. She received 2 units of blood in transfusion overnight. She has never had an upper scope in the past.   PAST MEDICAL HISTORY:  1.  End-stage renal disease on hemodialysis.  2.  Hypertension.  3.  Emphysema.  4.  Diastolic congestive heart failure.  5.  Hypothyroidism.  6.  History of colon cancer and resection.  7.  Lasix surgery.  8.  Cataract surgery.  9.  Hysterectomy.  10.  Colon surgery as noted.  11.  She has an AV  fistula in the left arm for her hemodialysis.  12.  The patient states that she had an episode of "yellow jaundice" when she was child. She has no known history of cirrhosis.   OUTPATIENT MEDICATIONS INCLUDE: Advair Diskus 1 puff twice a day, Combivent CFC-Free 1 puff 4 times a day, isosorbide mononitrate 60 mg once in the morning, Levaquin 250 mg tablet, which she has been taking as medication from her recent pneumonia, metoclopramide 5 mg twice a day before meals, Sensipar 30 mg once a day, and Synthroid 125 mcg daily.   ALLERGIES: SHE IS ALLERGIC TO SHELLFISH AND POLLEN.   PHYSICAL EXAMINATION:  VITAL SIGNS: Temperature is 99.4, pulse 77, respirations 19, blood pressure 119/55, pulse oximetry 100%.  GENERAL: She is an elderly appearing 74 year old female in no acute distress.  HEENT: Normocephalic, atraumatic.  EYES: Anicteric.  NOSE: Septum midline. No lesions.  OROPHARYNX: No lesions.  NECK: No JVD.  HEART: Regular rate and rhythm.  LUNGS: Clear.  ABDOMEN: Soft, obese, nontender, nondistended. Bowel sounds positive and normoactive. Scars reflect surgical history.  RECTAL: Anorectal exam deferred.  EXTREMITIES: No clubbing, cyanosis, or edema.  NEUROLOGICAL: Cranial nerves II through XII grossly intact. Muscle strength bilaterally equal and symmetric.   LABORATORY, DIAGNOSTIC, AND RADIOLOGICAL DATA INCLUDE THE FOLLOWING: Yesterday evening she had labs as follows: She had a BUN of 1, creatinine was 5.23, sodium 143, potassium 4.2, chloride 100, bicarbonate 29, calcium 8.7, lipase 197, total protein 5.7, albumin 2.6, total bilirubin 0.6, alkaline phosphatase 46, AST  10, ALT 11. She had a troponin I x 1 at 0.03. Hemogram showed a white count of 4.8, H and H of 7.0/21.7, platelet count 150,000. Her hemoglobin prior to discharge was 11.2. She has been transfused 2 units of blood, with current hemoglobin being 8.2, platelet count 114,000.   There have been no imaging studies.   FAMILY  HISTORY: There is sclerosing cholangitis in her daughter.   ASSESSMENT:  1.   Hematemesis and melena. The patient has been hemodynamically stable and is feeling somewhat better after 2 units of packed red cells. She has had no repeat hematemesis since last night. She has some abdominal discomfort before a bowel movement, but not otherwise.  2.  Multiple medical issues with hypertension, emphysema, congestive heart failure, hypothyroidism, history of colon cancer, and end-stage renal disease.   RECOMMENDATION:  1.  Continue IV PPI as you are.  2.  Serial hemoglobins. Transfuse as needed.  3.  EGD when clinically feasible. I have discussed the risks, benefits, and complications of EGD to include but not be limited to bleeding, infection, perforation, and the risk of sedation, and she wishes to proceed. This will likely be done by Dr. Thurmond Butts over the weekend.    ____________________________ Lollie Sails, MD mus:MT D: 12/28/2014 09:39:07 ET T: 12/28/2014 10:07:16 ET JOB#: [id number]  cc: Lollie Sails, MD, <Dictator> Lollie Sails MD ELECTRONICALLY SIGNED 01/07/2015 12:06

## 2015-04-23 NOTE — Discharge Summary (Signed)
PATIENT NAME:  Meredith, Reynolds MR#:  [id number] DATE OF BIRTH:  12-15-1941  DATE OF ADMISSION:  12/24/2014 DATE OF DISCHARGE:  12/26/2014  DISCHARGE DIAGNOSES:  1.  Right-sided pneumonia, most likely aspiration.  2.  End-stage renal disease.  3.  Hypertension.  4.  Hypothyroidism.   CHIEF COMPLAINT: Right-sided chest pain, shortness of breath.   HISTORY OF PRESENT ILLNESS: [name] is a 74 year old female who normally follows with Dr. Lisette Grinder. She also has a history of end-stage renal disease on hemodialysis, hypertension and hyperlipidemia who was admitted with right-sided chest pain and shortness of breath and reportedly had an episode of vomiting and diarrhea after she drank Nepro. The patient also developed some right-sided chest pain and subsequently came to the ED. A chest x-ray done in the ED showed evidence for increase in heterogenous opacities in the right mid and lower lung fields secondary to infection versus aspiration. She received clindamycin and Levaquin in the ED.   PAST MEDICAL HISTORY: Significant for end-stage renal disease on hemodialysis for the last 13 years. She gets dialysis on Tuesdays, Thursdays, and Saturdays. History of hypertension, COPD, diastolic congestive heart failure, hypothyroidism, history of colon cancer status post resection. Please see H and P for other details.   PHYSICAL EXAMINATION: VITAL SIGNS: Temperature was 97.7, pulse 64, blood pressure 156/61, respirations 17, O2 sat 96% on 2 liters nasal cannula.  HEENT: NCAT.  NECK: Supple.  HEART: S1 and S2.  LUNGS: Decreased air entry noted in the right lower lung zone posteriorly with coarse breath sounds.  EXTREMITIES: 2+ edema.  NEUROLOGIC: Nonfocal.   DIAGNOSTIC DATA: On admission, WBC count was 3.1, hemoglobin 10.9, hematocrit 34.3. Glucose 93. BNP 120,191. BUN 16, creatinine 5.63, sodium 138, potassium 3.9, chloride 99.  Initial chest x-ray showed interval increase in heterogenous right mid  and lower lung opacities secondary to infection versus aspiration.   Repeat chest x-ray showed mid right basilar opacities that are slightly improved from before.   HOSPITAL COURSE: The patient however remained clinically stable. She was afebrile and maintained good O2 sats. She received dialysis while she was in the hospital and she was discharged in stable condition on the following medications.  DISCHARGE MEDICATIONS: Levaquin 250 mg p.o. daily for 10 days, isosorbide mononitrate 60 mg once a day, metoclopramide 5 mg b.i.d. before meals, Combivent inhaler 1 puff q.i.d. as needed, Advair Diskus 100/50 one puff b.i.d., Sensipar 30 mg once a day, Synthroid 125 mcg once a day.   DISCHARGE INSTRUCTIONS: The patient is advised to follow up with Dr. Gilford Rile, advised to have a repeat chest x-ray done in a few weeks and if opacities are persistent the patient will need a CT of the chest. The patient was stable at the time of discharge.   TOTAL TIME SPENT IN DISCHARGING THE PATIENT: 35 minutes.   ____________________________ Tracie Harrier, MD vh:sb D: 12/26/2014 12:51:44 ET T: 12/26/2014 14:50:23 ET JOB#: [id number]  cc: Tracie Harrier, MD, <Dictator> Tracie Harrier MD ELECTRONICALLY SIGNED 01/08/2015 13:02

## 2015-04-27 NOTE — Consult Note (Signed)
Details:   - EGD was planned for today at 1.   Anesthesia had multiple add on cases this afternoon and therefore this case is not able to be done this afternoon.   Given the COPD and home 02, would not be safe with moderate sedation.  Will reschedule for tomorrow.    Regular diet now.  npo after midnight.   Electronic Signatures: Arther Dames (MD)  (Signed 03-Jan-16 14:34)  Authored: Details   Last Updated: 03-Jan-16 14:34 by Arther Dames (MD)

## 2015-04-27 NOTE — Discharge Summary (Signed)
PATIENT NAME:  [name], [name] MR#:  [id number] DATE OF BIRTH:  1941-10-15  DATE OF ADMISSION:  12/27/2014 DATE OF DISCHARGE:  12/31/2014  FINAL DIAGNOSES:  1.  Recent aspiration pneumonitis.  2.  Hematemesis.  3.  Upper gastrointestinal bleed.  4.  Acute blood loss anemia secondary to gastrointestinal bleeding.  5.  End-stage renal disease, on dialysis.  6.  History of pancytopenia.   PRINCIPAL PROCEDURES: Upper endoscopy performed on 12/30/2014 by Dr. Arther Dames; this revealed nonbleeding esophageal ulcer, hiatal hernia, gastropathy and duodenopathy.   HISTORY AND PHYSICAL: Please see dictated admission history and physical.   HOSPITAL COURSE: The patient was admitted after having episodes of nausea, vomiting with hematemesis, as well as having dark stool. She was noted to have a drop in hemoglobin from 11 to 7. She recently had been treated for aspiration pneumonitis. She was made n.p.o., placed on acid reducing medications. She was evaluated by GI and underwent endoscopy, with the findings as noted above. She had no further episodes nausea or vomiting. She was switched over to oral PPIs. Her diet was advanced as tolerated. Nephrology followed throughout and dialysis was performed according to their schedule.   She appeared to be medically stable for discharge, and so was discharged to home on a renal diet with her physical activity to be up as tolerated with a walker. She will follow up with dialysis as scheduled, and appointment was made for a follow up with Dr. Gilford Rile in 1-2 weeks, with further GI followup to be arranged from there. Home health physical therapy and nursing was arranged to resume their previous services. She had been on oxygen 2 L nasal cannula for chronic respiratory failure and was continued on this at home.   DISCHARGE MEDICATIONS:  1.  Levothyroxine 0.125 mg p.o. daily.  2.  Sensipar 30 mg p.o. at bedtime.  3.  Advair 100/50 1 puff b.i.d.  4.  Combivent 1 puff 4  times a day as needed for wheezing.  5.  Imdur 60 mg p.o. daily.  6.  Levaquin 250 mg p.o. daily x10 days to complete a course of antibiotics for her aspiration pneumonitis.  7.  Ondansetron 4 mg p.o. q. 8 hours p.r.n. nausea or vomiting.  8.  Pantoprazole 40 mg p.o. b.i.d.  9.  She was taken off her Reglan during this hospitalization.   CODE STATUS: The patient was Do Not Resuscitate.     ____________________________ Adin Hector, MD bjk:MT D: 01/14/2015 13:02:01 ET T: 01/14/2015 13:18:25 ET JOB#: [id number]  cc: Adin Hector, MD, <Dictator> Ramonita Lab MD ELECTRONICALLY SIGNED 01/17/2015 9:22

## 2015-05-01 ENCOUNTER — Inpatient Hospital Stay
Admission: EM | Admit: 2015-05-01 | Discharge: 2015-05-06 | DRG: 871 | Disposition: A | Discharge status: Home / Self Care | Payer: Medicare Other | Attending: Specialist | Admitting: Specialist

## 2015-05-01 ENCOUNTER — Emergency Department: Payer: Medicare Other

## 2015-05-01 ENCOUNTER — Encounter: Payer: Self-pay | Admitting: Internal Medicine

## 2015-05-01 ENCOUNTER — Inpatient Hospital Stay (HOSPITAL_COMMUNITY): Payer: Medicare Other

## 2015-05-01 DIAGNOSIS — R7989 Other specified abnormal findings of blood chemistry: Secondary | ICD-10-CM | POA: Diagnosis present

## 2015-05-01 DIAGNOSIS — I214 Non-ST elevation (NSTEMI) myocardial infarction: Secondary | ICD-10-CM | POA: Diagnosis not present

## 2015-05-01 DIAGNOSIS — Z8249 Family history of ischemic heart disease and other diseases of the circulatory system: Secondary | ICD-10-CM | POA: Diagnosis not present

## 2015-05-01 DIAGNOSIS — I340 Nonrheumatic mitral (valve) insufficiency: Secondary | ICD-10-CM | POA: Diagnosis not present

## 2015-05-01 DIAGNOSIS — J849 Interstitial pulmonary disease, unspecified: Secondary | ICD-10-CM

## 2015-05-01 DIAGNOSIS — N390 Urinary tract infection, site not specified: Secondary | ICD-10-CM | POA: Diagnosis present

## 2015-05-01 DIAGNOSIS — E039 Hypothyroidism, unspecified: Secondary | ICD-10-CM | POA: Diagnosis present

## 2015-05-01 DIAGNOSIS — Z9049 Acquired absence of other specified parts of digestive tract: Secondary | ICD-10-CM | POA: Diagnosis present

## 2015-05-01 DIAGNOSIS — N2581 Secondary hyperparathyroidism of renal origin: Secondary | ICD-10-CM | POA: Diagnosis present

## 2015-05-01 DIAGNOSIS — M1990 Unspecified osteoarthritis, unspecified site: Secondary | ICD-10-CM | POA: Diagnosis present

## 2015-05-01 DIAGNOSIS — Z85038 Personal history of other malignant neoplasm of large intestine: Secondary | ICD-10-CM | POA: Diagnosis not present

## 2015-05-01 DIAGNOSIS — I120 Hypertensive chronic kidney disease with stage 5 chronic kidney disease or end stage renal disease: Secondary | ICD-10-CM | POA: Diagnosis present

## 2015-05-01 DIAGNOSIS — I5032 Chronic diastolic (congestive) heart failure: Secondary | ICD-10-CM | POA: Diagnosis present

## 2015-05-01 DIAGNOSIS — J9601 Acute respiratory failure with hypoxia: Secondary | ICD-10-CM | POA: Diagnosis present

## 2015-05-01 DIAGNOSIS — J449 Chronic obstructive pulmonary disease, unspecified: Secondary | ICD-10-CM | POA: Diagnosis present

## 2015-05-01 DIAGNOSIS — J9600 Acute respiratory failure, unspecified whether with hypoxia or hypercapnia: Secondary | ICD-10-CM | POA: Diagnosis present

## 2015-05-01 DIAGNOSIS — I5033 Acute on chronic diastolic (congestive) heart failure: Secondary | ICD-10-CM | POA: Diagnosis present

## 2015-05-01 DIAGNOSIS — E872 Acidosis: Secondary | ICD-10-CM | POA: Diagnosis present

## 2015-05-01 DIAGNOSIS — Z87891 Personal history of nicotine dependence: Secondary | ICD-10-CM

## 2015-05-01 DIAGNOSIS — I499 Cardiac arrhythmia, unspecified: Secondary | ICD-10-CM | POA: Diagnosis present

## 2015-05-01 DIAGNOSIS — I248 Other forms of acute ischemic heart disease: Secondary | ICD-10-CM | POA: Diagnosis present

## 2015-05-01 DIAGNOSIS — Y95 Nosocomial condition: Secondary | ICD-10-CM | POA: Diagnosis present

## 2015-05-01 DIAGNOSIS — Z66 Do not resuscitate: Secondary | ICD-10-CM | POA: Diagnosis present

## 2015-05-01 DIAGNOSIS — E785 Hyperlipidemia, unspecified: Secondary | ICD-10-CM | POA: Diagnosis present

## 2015-05-01 DIAGNOSIS — R778 Other specified abnormalities of plasma proteins: Secondary | ICD-10-CM | POA: Diagnosis present

## 2015-05-01 DIAGNOSIS — E162 Hypoglycemia, unspecified: Secondary | ICD-10-CM | POA: Diagnosis present

## 2015-05-01 DIAGNOSIS — K219 Gastro-esophageal reflux disease without esophagitis: Secondary | ICD-10-CM | POA: Diagnosis present

## 2015-05-01 DIAGNOSIS — Z992 Dependence on renal dialysis: Secondary | ICD-10-CM

## 2015-05-01 DIAGNOSIS — Z90710 Acquired absence of both cervix and uterus: Secondary | ICD-10-CM

## 2015-05-01 DIAGNOSIS — D631 Anemia in chronic kidney disease: Secondary | ICD-10-CM | POA: Diagnosis present

## 2015-05-01 DIAGNOSIS — J189 Pneumonia, unspecified organism: Secondary | ICD-10-CM | POA: Diagnosis present

## 2015-05-01 DIAGNOSIS — Z7951 Long term (current) use of inhaled steroids: Secondary | ICD-10-CM | POA: Diagnosis not present

## 2015-05-01 DIAGNOSIS — R059 Cough, unspecified: Secondary | ICD-10-CM

## 2015-05-01 DIAGNOSIS — I10 Essential (primary) hypertension: Secondary | ICD-10-CM | POA: Diagnosis present

## 2015-05-01 DIAGNOSIS — Z79899 Other long term (current) drug therapy: Secondary | ICD-10-CM

## 2015-05-01 DIAGNOSIS — N186 End stage renal disease: Secondary | ICD-10-CM

## 2015-05-01 DIAGNOSIS — R05 Cough: Secondary | ICD-10-CM

## 2015-05-01 DIAGNOSIS — Z809 Family history of malignant neoplasm, unspecified: Secondary | ICD-10-CM

## 2015-05-01 DIAGNOSIS — D693 Immune thrombocytopenic purpura: Secondary | ICD-10-CM | POA: Diagnosis present

## 2015-05-01 DIAGNOSIS — A419 Sepsis, unspecified organism: Secondary | ICD-10-CM | POA: Diagnosis present

## 2015-05-01 HISTORY — DX: Dependence on renal dialysis: Z99.2

## 2015-05-01 HISTORY — DX: End stage renal disease: N18.6

## 2015-05-01 HISTORY — DX: Chronic diastolic (congestive) heart failure: I50.32

## 2015-05-01 LAB — CBC WITH DIFFERENTIAL/PLATELET
Basophils Absolute: 0 K/uL (ref 0–0.1)
Basophils Relative: 1 %
Eosinophils Absolute: 0 K/uL (ref 0–0.7)
Eosinophils Relative: 0 %
HCT: 34 % — ABNORMAL LOW (ref 35.0–47.0)
Hemoglobin: 10.6 g/dL — ABNORMAL LOW (ref 12.0–16.0)
Lymphocytes Relative: 7 %
Lymphs Abs: 0.5 K/uL — ABNORMAL LOW (ref 1.0–3.6)
MCH: 29 pg (ref 26.0–34.0)
MCHC: 31.3 g/dL — ABNORMAL LOW (ref 32.0–36.0)
MCV: 92.7 fL (ref 80.0–100.0)
Monocytes Absolute: 0.5 K/uL (ref 0.2–0.9)
Monocytes Relative: 7 %
Neutro Abs: 6.9 K/uL — ABNORMAL HIGH (ref 1.4–6.5)
Neutrophils Relative %: 85 %
Platelets: 154 K/uL (ref 150–440)
RBC: 3.67 MIL/uL — ABNORMAL LOW (ref 3.80–5.20)
RDW: 20.4 % — ABNORMAL HIGH (ref 11.5–14.5)
WBC: 8.1 K/uL (ref 3.6–11.0)

## 2015-05-01 LAB — CBC
HEMATOCRIT: 31 % — AB (ref 35.0–47.0)
HEMOGLOBIN: 9.7 g/dL — AB (ref 12.0–16.0)
MCH: 28.5 pg (ref 26.0–34.0)
MCHC: 31.4 g/dL — ABNORMAL LOW (ref 32.0–36.0)
MCV: 90.9 fL (ref 80.0–100.0)
Platelets: 105 10*3/uL — ABNORMAL LOW (ref 150–440)
RBC: 3.41 MIL/uL — AB (ref 3.80–5.20)
RDW: 19.5 % — ABNORMAL HIGH (ref 11.5–14.5)
WBC: 6.1 10*3/uL (ref 3.6–11.0)

## 2015-05-01 LAB — PROTIME-INR
INR: 1.66
Prothrombin Time: 19.8 s — ABNORMAL HIGH (ref 11.4–15.0)

## 2015-05-01 LAB — GLUCOSE, CAPILLARY
GLUCOSE-CAPILLARY: 119 mg/dL — AB (ref 70–99)
GLUCOSE-CAPILLARY: 138 mg/dL — AB (ref 70–99)
GLUCOSE-CAPILLARY: 106 mg/dL — AB (ref 70–99)
GLUCOSE-CAPILLARY: 96 mg/dL (ref 70–99)
GLUCOSE-CAPILLARY: 61 mg/dL — AB (ref 70–99)
Glucose-Capillary: 160 mg/dL — ABNORMAL HIGH (ref 70–99)
Glucose-Capillary: 53 mg/dL — ABNORMAL LOW (ref 70–99)

## 2015-05-01 LAB — URINALYSIS COMPLETE WITH MICROSCOPIC (ARMC ONLY)
SPECIFIC GRAVITY, URINE: 1.015 (ref 1.005–1.030)
Squamous Epithelial / LPF: NONE SEEN
pH: 0 — ABNORMAL LOW (ref 5.0–8.0)

## 2015-05-01 LAB — MAGNESIUM: MAGNESIUM: 1.9 mg/dL (ref 1.7–2.4)

## 2015-05-01 LAB — TYPE AND SCREEN
ABO/RH(D): AB POS
Antibody Screen: NEGATIVE

## 2015-05-01 LAB — ABO/RH: ABO/RH(D): AB POS

## 2015-05-01 LAB — BLOOD GAS, ARTERIAL
ACID-BASE EXCESS: 0.5 mmol/L (ref 0.0–3.0)
Allens test (pass/fail): POSITIVE — AB
Bicarbonate: 25.4 mEq/L (ref 21.0–28.0)
FIO2: 1 %
O2 SAT: 80.7 %
Patient temperature: 37
pCO2 arterial: 41 mmHg (ref 32.0–48.0)
pH, Arterial: 7.4 (ref 7.350–7.450)
pO2, Arterial: 45 mmHg — ABNORMAL LOW (ref 83.0–108.0)

## 2015-05-01 LAB — LACTIC ACID, PLASMA
LACTIC ACID, VENOUS: 4.3 mmol/L — AB (ref 0.5–2.0)
Lactic Acid, Venous: 10 mmol/L (ref 0.5–2.0)
Lactic Acid, Venous: 0.9 mmol/L (ref 0.5–2.0)

## 2015-05-01 LAB — COMPREHENSIVE METABOLIC PANEL
ALT: 13 U/L — AB (ref 14–54)
AST: 60 U/L — ABNORMAL HIGH (ref 15–41)
Albumin: 3.6 g/dL (ref 3.5–5.0)
Alkaline Phosphatase: 75 U/L (ref 38–126)
Anion gap: 29 — ABNORMAL HIGH (ref 5–15)
BUN: 32 mg/dL — ABNORMAL HIGH (ref 6–20)
CO2: 20 mmol/L — ABNORMAL LOW (ref 22–32)
Calcium: 8.5 mg/dL — ABNORMAL LOW (ref 8.9–10.3)
Chloride: 89 mmol/L — ABNORMAL LOW (ref 101–111)
Creatinine, Ser: 6.95 mg/dL — ABNORMAL HIGH (ref 0.44–1.00)
GFR calc non Af Amer: 5 mL/min — ABNORMAL LOW (ref >60)
GFR, EST AFRICAN AMERICAN: 6 mL/min — AB (ref >60)
GLUCOSE: 84 mg/dL (ref 65–99)
Potassium: 4.3 mmol/L (ref 3.5–5.1)
SODIUM: 138 mmol/L (ref 135–145)
Total Bilirubin: 2.2 mg/dL — ABNORMAL HIGH (ref 0.3–1.2)
Total Protein: 7.6 g/dL (ref 6.5–8.1)

## 2015-05-01 LAB — BLOOD GAS, VENOUS
Acid-base deficit: 4.4 mmol/L — ABNORMAL HIGH (ref 0.0–2.0)
Bicarbonate: 22.6 meq/L (ref 21.0–28.0)
Patient temperature: 37
pCO2, Ven: 48 mmHg (ref 44.0–60.0)
pH, Ven: 7.28 — ABNORMAL LOW (ref 7.320–7.430)

## 2015-05-01 LAB — CREATININE, SERUM
Creatinine, Ser: 6.63 mg/dL — ABNORMAL HIGH (ref 0.44–1.00)
GFR, EST AFRICAN AMERICAN: 6 mL/min — AB (ref >60)
GFR, EST NON AFRICAN AMERICAN: 5 mL/min — AB (ref >60)

## 2015-05-01 LAB — PHOSPHORUS: Phosphorus: 7 mg/dL — ABNORMAL HIGH (ref 2.5–4.6)

## 2015-05-01 LAB — TROPONIN I
TROPONIN I: 2.81 ng/mL — AB (ref <0.031)
TROPONIN I: 3.52 ng/mL — AB (ref <0.031)
Troponin I: 2.35 ng/mL — ABNORMAL HIGH (ref <0.031)

## 2015-05-01 LAB — BRAIN NATRIURETIC PEPTIDE: B NATRIURETIC PEPTIDE 5: 3413 pg/mL — AB (ref 0.0–100.0)

## 2015-05-01 LAB — LIPASE, BLOOD: Lipase: 38 U/L (ref 22–51)

## 2015-05-01 MED ORDER — ONDANSETRON HCL 4 MG PO TABS
4.0000 mg | ORAL_TABLET | Freq: Four times a day (QID) | ORAL | Status: DC | PRN
  Administered 2015-05-04 – 2015-05-06 (×2): 4 mg via ORAL
  Filled 2015-05-01 (×2): qty 1

## 2015-05-01 MED ORDER — SODIUM CHLORIDE 0.9 % IV SOLN: 100.0000 mL | INTRAVENOUS | Status: DC | PRN

## 2015-05-01 MED ORDER — CETYLPYRIDINIUM CHLORIDE 0.05 % MT LIQD
7.0000 mL | Freq: Two times a day (BID) | OROMUCOSAL | Status: DC
  Administered 2015-05-01 – 2015-05-03 (×3): 7 mL via OROMUCOSAL

## 2015-05-01 MED ORDER — PIPERACILLIN-TAZOBACTAM 3.375 G IVPB
3.3750 g | Freq: Two times a day (BID) | INTRAVENOUS | Status: DC
  Administered 2015-05-01 – 2015-05-04 (×6): 3.375 g via INTRAVENOUS
  Filled 2015-05-01 (×8): qty 50

## 2015-05-01 MED ORDER — VANCOMYCIN HCL IN DEXTROSE 1-5 GM/200ML-% IV SOLN
1000.0000 mg | Freq: Once | INTRAVENOUS | Status: AC
  Administered 2015-05-01: 1000 mg via INTRAVENOUS
  Filled 2015-05-01: qty 200

## 2015-05-01 MED ORDER — SODIUM CHLORIDE 0.9 % IV BOLUS (SEPSIS)
1000.0000 mL | INTRAVENOUS | Status: DC
  Administered 2015-05-01: 1000 mL via INTRAVENOUS

## 2015-05-01 MED ORDER — IPRATROPIUM-ALBUTEROL 18-103 MCG/ACT IN AERO: 2.0000 | INHALATION_SPRAY | Freq: Two times a day (BID) | RESPIRATORY_TRACT | Status: DC | PRN

## 2015-05-01 MED ORDER — ACETAMINOPHEN 650 MG RE SUPP: 650.0000 mg | Freq: Four times a day (QID) | RECTAL | Status: DC | PRN

## 2015-05-01 MED ORDER — SODIUM CHLORIDE 0.9 % IV SOLN: INTRAVENOUS | Status: DC

## 2015-05-01 MED ORDER — CALCIUM ACETATE 667 MG PO CAPS
667.0000 mg | ORAL_CAPSULE | Freq: Three times a day (TID) | ORAL | Status: DC
  Administered 2015-05-02 – 2015-05-06 (×11): 667 mg via ORAL
  Filled 2015-05-01 (×27): qty 1

## 2015-05-01 MED ORDER — DEXTROSE 50 % IV SOLN
INTRAVENOUS | Status: AC
  Administered 2015-05-01: 50 mL
  Filled 2015-05-01: qty 50

## 2015-05-01 MED ORDER — SODIUM CHLORIDE 0.9 % IV BOLUS (SEPSIS): 250.0000 mL | INTRAVENOUS | Status: DC

## 2015-05-01 MED ORDER — HEPARIN SODIUM (PORCINE) 5000 UNIT/ML IJ SOLN
5000.0000 [IU] | Freq: Three times a day (TID) | INTRAMUSCULAR | Status: DC
  Administered 2015-05-01 – 2015-05-06 (×13): 5000 [IU] via SUBCUTANEOUS
  Filled 2015-05-01 (×13): qty 1

## 2015-05-01 MED ORDER — SEVELAMER CARBONATE 800 MG PO TABS: 1600.0000 mg | ORAL_TABLET | ORAL | Status: DC

## 2015-05-01 MED ORDER — LEVOTHYROXINE SODIUM 125 MCG PO TABS
125.0000 ug | ORAL_TABLET | Freq: Every day | ORAL | Status: DC
  Administered 2015-05-01 – 2015-05-06 (×6): 125 ug via ORAL
  Filled 2015-05-01 (×6): qty 1

## 2015-05-01 MED ORDER — ONDANSETRON HCL 4 MG/2ML IJ SOLN
4.0000 mg | Freq: Four times a day (QID) | INTRAMUSCULAR | Status: DC | PRN
  Administered 2015-05-05: 14:00:00 4 mg via INTRAVENOUS
  Filled 2015-05-01: qty 2

## 2015-05-01 MED ORDER — SENNOSIDES-DOCUSATE SODIUM 8.6-50 MG PO TABS: 1.0000 | ORAL_TABLET | Freq: Every evening | ORAL | Status: DC | PRN

## 2015-05-01 MED ORDER — IPRATROPIUM-ALBUTEROL 0.5-2.5 (3) MG/3ML IN SOLN
3.0000 mL | RESPIRATORY_TRACT | Status: AC
  Administered 2015-05-01: 3 mL via RESPIRATORY_TRACT
  Filled 2015-05-01: qty 3

## 2015-05-01 MED ORDER — SEVELAMER CARBONATE 800 MG PO TABS: 2400.0000 mg | ORAL_TABLET | ORAL | Status: DC

## 2015-05-01 MED ORDER — NEPHRO-VITE 0.8 MG PO TABS
1.0000 | ORAL_TABLET | Freq: Every day | ORAL | Status: DC
  Administered 2015-05-01 – 2015-05-05 (×5): 1 via ORAL
  Filled 2015-05-01 (×7): qty 1

## 2015-05-01 MED ORDER — VANCOMYCIN HCL IN DEXTROSE 750-5 MG/150ML-% IV SOLN
750.0000 mg | Freq: Once | INTRAVENOUS | Status: AC
  Administered 2015-05-01: 750 mg via INTRAVENOUS
  Filled 2015-05-01 (×2): qty 150

## 2015-05-01 MED ORDER — SODIUM CHLORIDE 0.9 % IV BOLUS (SEPSIS): 500.0000 mL | INTRAVENOUS | Status: DC

## 2015-05-01 MED ORDER — IPRATROPIUM-ALBUTEROL 0.5-2.5 (3) MG/3ML IN SOLN
3.0000 mL | RESPIRATORY_TRACT | Status: DC | PRN
  Administered 2015-05-01 – 2015-05-03 (×2): 3 mL via RESPIRATORY_TRACT
  Filled 2015-05-01 (×2): qty 3

## 2015-05-01 MED ORDER — SODIUM CHLORIDE 0.9 % IV BOLUS (SEPSIS)
1000.0000 mL | INTRAVENOUS | Status: AC
  Administered 2015-05-01: 1000 mL via INTRAVENOUS

## 2015-05-01 MED ORDER — SODIUM CHLORIDE 0.9 % IV SOLN: 1000.0000 mL | INTRAVENOUS | Status: DC

## 2015-05-01 MED ORDER — VANCOMYCIN HCL IN DEXTROSE 1-5 GM/200ML-% IV SOLN
1000.0000 mg | INTRAVENOUS | Status: DC | PRN
  Filled 2015-05-01: qty 200

## 2015-05-01 MED ORDER — SEVELAMER CARBONATE 800 MG PO TABS
2400.0000 mg | ORAL_TABLET | Freq: Three times a day (TID) | ORAL | Status: DC
  Administered 2015-05-02 – 2015-05-06 (×11): 2400 mg via ORAL
  Filled 2015-05-01 (×11): qty 3

## 2015-05-01 MED ORDER — LEVOTHYROXINE SODIUM 125 MCG PO TABS: 125.0000 ug | ORAL_TABLET | Freq: Every day | ORAL | Status: DC

## 2015-05-01 MED ORDER — ACETAMINOPHEN 325 MG PO TABS
650.0000 mg | ORAL_TABLET | Freq: Four times a day (QID) | ORAL | Status: DC | PRN
  Administered 2015-05-06: 650 mg via ORAL
  Filled 2015-05-01: qty 2

## 2015-05-01 MED ORDER — PIPERACILLIN-TAZOBACTAM 3.375 G IVPB 30 MIN
3.3750 g | Freq: Once | INTRAVENOUS | Status: AC
  Administered 2015-05-01: 3.375 g via INTRAVENOUS
  Filled 2015-05-01: qty 50

## 2015-05-01 MED ORDER — ASPIRIN 81 MG PO CHEW
324.0000 mg | CHEWABLE_TABLET | Freq: Once | ORAL | Status: AC
  Administered 2015-05-01: 324 mg via ORAL

## 2015-05-01 MED ORDER — ASPIRIN 81 MG PO CHEW
CHEWABLE_TABLET | ORAL | Status: AC
  Administered 2015-05-01: 324 mg via ORAL
  Filled 2015-05-01: qty 4

## 2015-05-01 MED ORDER — CHLORHEXIDINE GLUCONATE 0.12 % MT SOLN
15.0000 mL | Freq: Two times a day (BID) | OROMUCOSAL | Status: DC
  Administered 2015-05-02 – 2015-05-03 (×3): 15 mL via OROMUCOSAL

## 2015-05-01 MED ORDER — CINACALCET HCL 30 MG PO TABS
30.0000 mg | ORAL_TABLET | Freq: Every day | ORAL | Status: DC
  Administered 2015-05-01 – 2015-05-05 (×5): 30 mg via ORAL
  Filled 2015-05-01 (×7): qty 1

## 2015-05-01 MED ORDER — TETRAHYDROZOLINE HCL 0.05 % OP SOLN
1.0000 [drp] | Freq: Every day | OPHTHALMIC | Status: DC | PRN
  Filled 2015-05-01: qty 15

## 2015-05-01 NOTE — H&P (Addendum)
[facility] [place] NAME: [name]    MR#:  [id number]  DATE OF BIRTH:  1941-07-15  DATE OF ADMISSION:  05/01/2015  PRIMARY CARE PHYSICIAN: Madelyn Brunner, MD   REQUESTING/REFERRING PHYSICIAN: Benjaman Lobe  CHIEF COMPLAINT:   Chief Complaint  Patient presents with  . Hypoglycemia    HISTORY OF PRESENT ILLNESS:  [name]  is a 74 y.o. female with past medical history as listed below, who presents with hypoglycemic episode at home this morning. Patient states that she woke up this morning and she was confused, talking out of her head. Her daughter was with her ready to take her to dialysis. Patient states that she was having significant sweats last night. Her daughter was concerned when she was weak and confused, and called EMS. EMS found her fingerstick blood sugar value to be, reportedly, 15. They brought her to the ED right away, where her fingerstick was found to still be low at 45 despite glucose treatment she was given by EMS. The patient states here that she has had several days of productive sputum, yellow in color. She also states that she's been having profuse sweating at different times throughout the day, and worst at night. She denies nausea, vomiting, abdominal pain, diarrhea, chest pain. She states that she has had some increased shortness of breath. On evaluation in the ED she was found to be hypoxic requiring O2 via nasal cannula, and then nonrebreather. Apart from the hypoglycemia, she was also found to have a troponin elevated at 2.35, and a lactic acid very elevated at 10. Hospitals were called for admission for sepsis. Chest x-ray shows likely pneumonia.  PAST MEDICAL HISTORY:   Past Medical History  Diagnosis Date  . Hypertension   . Hyperlipidemia   . Thyroid disease     Hypothyroidism  . Chronic kidney disease   . Autoimmune thrombocytopenia   . COPD (chronic obstructive pulmonary disease)   .  Diverticulitis 2013 sept.      hospitalized  at [place].  . Anginal pain   . Hypothyroidism   . Shortness of breath   . Pneumonia   . Chronic diastolic CHF (congestive heart failure)   . GERD (gastroesophageal reflux disease)   . Arthritis     PAST SURGICAL HISTORY:   Past Surgical History  Procedure Laterality Date  . Av fistula placement, brachiocephalic  22/97/9892    right arm  . Abdominal hysterectomy    . Colonoscopy  Dec 2013    colon cancer .. surgery  to be 2014  . Shuntogram Left 05/28/2013    Procedure: SHUNTOGRAM;  Surgeon: Conrad Lafayette, MD;  Location: [facility] CATH LAB;  Service: Cardiovascular;  Laterality: Left;    SOCIAL HISTORY:   History  Substance Use Topics  . Smoking status: Former Smoker    Types: Cigarettes  . Smokeless tobacco: Never Used  . Alcohol Use: No    FAMILY HISTORY:   Family History  Problem Relation Age of Onset  . Heart disease Mother   . Cancer Father   . Kidney disease Brother     DRUG ALLERGIES:   Allergies  Allergen Reactions  . Shellfish-Derived Products Anaphylaxis    MEDICATIONS AT HOME:   Prior to Admission medications   Medication Sig Start Date End Date Taking? Authorizing Provider  acetaminophen (TYLENOL) 500 MG tablet Take 1,000 mg by mouth 2 (two) times daily as needed (for headaches).    Historical Provider,  MD  albuterol-ipratropium (COMBIVENT) 18-103 MCG/ACT inhaler Inhale 2 puffs into the lungs 2 (two) times daily as needed for shortness of breath. For wheezing    Historical Provider, MD  B Complex-C-Folic Acid (NEPHRO-VITE PO) Take 1 tablet by mouth at bedtime.     Historical Provider, MD  calcium acetate (PHOSLO) 667 MG capsule Take 667 mg by mouth 3 (three) times daily with meals.      Historical Provider, MD  cinacalcet (SENSIPAR) 30 MG tablet Take 30 mg by mouth at bedtime.     Historical Provider, MD  diphenhydrAMINE (BENADRYL) 25 mg capsule Take 25 mg by mouth 3 (three) times daily as needed for itching or  allergies. For allergies/sleep    Historical Provider, MD  Fluticasone-Salmeterol (ADVAIR) 250-50 MCG/DOSE AEPB Inhale 1 puff into the lungs 2 (two) times daily as needed (for shortness of breath).    Historical Provider, MD  isosorbide mononitrate (IMDUR) 60 MG 24 hr tablet Take 60 mg by mouth at bedtime.  04/07/13   Historical Provider, MD  levothyroxine (SYNTHROID, LEVOTHROID) 125 MCG tablet Take 125 mcg by mouth at bedtime.     Historical Provider, MD  sevelamer (RENVELA) 800 MG tablet Take 1,600-2,400 mg by mouth See admin instructions. Takes 3 tablets before meals and 2 tablets with snacks    Historical Provider, MD  Tetrahydrozoline HCl (VISINE OP) Place 1 drop into the right eye daily as needed (help clear vision).    Historical Provider, MD    REVIEW OF SYSTEMS:  CONSTITUTIONAL: Subjective fevers/sweats, weakness, no fatigue, no weight changes.  EYES: No blurred or double vision, no pain, no redness EARS, NOSE, THROAT: No ear pain, no hearing loss, no discharge, no difficulty swallowing RESPIRATORY: Productive cough with wheeze, no hemoptysis, no dyspnea CARDIOVASCULAR: Chronic bilateral lower extremity edema, no chest pain, no orthopnea, no palpitations, no syncope GASTROINTESTINAL: No nausea/vomiting/diarrhea, no abdominal pain, no change in bowel habits GENITOURINARY: No dysuria, no hematuria, no frequency, no incontinence, patient is anuric ENDOCRINE: Chronic stable thyroid problems, no nocturia, no thirst HEMATOLOGIC/LYMPHATIC: No anemia, no easy bruising bleeding, no swollen glands INTEGUMENTARY: No acne, no rash, no lesions, no change in mole-hair-skin  MUSCULOSKELETAL: No acute arthritis, no joint swelling, no gout NEUROLOGICAL: No numbness, no weakness, no dysarthria PSYCHIATRIC: No anxiety, no insomnia, no mania, no depression   VITAL SIGNS:   Filed Vitals:   05/01/15 0915 05/01/15 0930 05/01/15 1000 05/01/15 1015  BP: 121/53 126/59 127/66 137/74  Pulse: 75 78 72 73   Temp:      TempSrc:      Resp: 21 21 22 17   Height:      Weight:      SpO2: 96% 88%  90%   Wt Readings from Last 3 Encounters:  05/01/15 71.668 kg (158 lb)  05/11/13 75.751 kg (167 lb)  04/22/13 77.5 kg (170 lb 13.7 oz)    PHYSICAL EXAMINATION:  General:  Well nourished, in mild respiratory distress HEENT: PERRL, EOMI, no scleral icterus, conjunctivitis, or difficulty hearing Neck: supple, no masses, non tender, no cervical adenopathy, no JVD, thyroid not enlarged Cardiovascular: Irregular rate and rhythm, 2/6 systolic murmur without rubs or gallops, no S3/S4, good pedal pulses, 1+ bilateral LE edema. Respiratory: Diffuse bilateral coarse breath sounds with expiratory wheezing, wrist in bilateral bases and left mid lung field, slightly increased respiratory effort Abdomen: soft, nontender, nondistended, no mass, bowel sounds present, no hepatosplenomegaly Musculoskeletal: 5/5 muscular strength x4 extremities, full spontaneous range of motion throughout, no cyanosis/clubbing Skin: no  rash, no lesions, no erythema, warm and dry  Lymphatic: No adenopathy Neurologic: Cranial nerves intact, sensation intact, no dysarthria, no aphasia Psychiatric: Alert, Oriented to - time, person, place, circumstance, patient is cooperative, not confused, not agitated, not depressed  LABORATORY PANEL:   CBC  Recent Labs Lab 05/01/15 0755  WBC 8.1  HGB 10.6*  HCT 34.0*  PLT 154   ------------------------------------------------------------------------------------------------------------------  Chemistries   Recent Labs Lab 05/01/15 0755  NA 138  K 4.3  CL 89*  CO2 20*  GLUCOSE 84  BUN 32*  CREATININE 6.95*  CALCIUM 8.5*  AST 60*  ALT 13*  ALKPHOS 75  BILITOT 2.2*   ------------------------------------------------------------------------------------------------------------------  Cardiac Enzymes  Recent Labs Lab 05/01/15 0755  TROPONINI 2.35*    ------------------------------------------------------------------------------------------------------------------  RADIOLOGY:  Dg Chest Port 1 View  05/01/2015   CLINICAL DATA:  Sepsis.  EXAM: PORTABLE CHEST - 1 VIEW  COMPARISON:  12/30/2014.  12/25/2014.  FINDINGS: Mediastinum and hilar structures are normal. Chronic interstitial prominence consistent with chronic interstitial lung disease. Bibasilar subsegmental atelectasis and/or infiltrates. Pleural parenchymal thickening again noted consistent with scarring. Cardiomegaly with normal pulmonary vascularity. No pleural effusion or pneumothorax.  IMPRESSION: 1. Chronic interstitial lung disease. Pleural parenchymal scarring . 2. Mild bibasilar subsegmental atelectasis and/or infiltrates. 3. Stable cardiomegaly.   Electronically Signed   By: Marcello Moores  Register   On: 05/01/2015 08:30    EKG:   Orders placed or performed during the hospital encounter of 05/01/15  . EKG 12-Lead  . EKG 12-Lead  . EKG 12-Lead  . EKG 12-Lead    IMPRESSION AND PLAN:  Principal Problem:   Sepsis - patient does not meet SIRS criteria technically by our recorded data so far, she does have an increased respiratory rate. However she also mentions sweats and subjective fever, and in the setting of likely pneumonia with a lactic acid of 10 we are admitting her to the ICU floor as sepsis likely due to HCAP.  We will trend her lactic acid, resuscitate her with fluids very carefully given her heart failure and end-stage renal disease on dialysis. We will treat her pneumonia as below. Patient is currently hemodynamically stable. Active Problems:   HCAP (healthcare-associated pneumonia) - patient was given Vanco and Zosyn in the ED, we will continue these antibiotics on the floor. Sputum culture has been sent, as well as blood and urine cultures.    Acute respiratory failure with hypoxemia - she needed first O2 nasal cannula and then nonrebreather here in the ED. O2 sats are  still variable between mid to high 80s. We'll send stat ABG. Patient states that she is a DO NOT RESUSCITATE, as of this time she is able to tolerate noninvasive ventilation if necessary.   Elevated troponin - in the setting of end-stage renal disease on dialysis, however her troponin is significantly elevated at greater than 2. We will continue to trend this and we will get a cardiology consult for recommendations for further management.   ESRD (end stage renal disease) on dialysis - urgent nephrology consult placed for dialysis support. Given the patient's lactic acid and fluid requirement she may very well need CRRT. Appreciate nephrology's assistance with this patient.   COPD (chronic obstructive pulmonary disease) - not on home inhalers at baseline. Given current wheezing we will have duo nebs on board while admitted.   Hypoglycemia - currently stable after glucose replacement.  We will continue to monitor this closely.   Diastolic congestive heart failure - history of the same,  with no recent echo found in the system on chart review. We'll order echocardiogram a she is here and patient, and follow cardiology's recommendations.   HTN (hypertension) - this is a chronic problem, however the patient's blood pressure here is stable but maybe even borderline low. We will currently hold antihypertensives.   Hypothyroidism - chronic stable problem can continue patient's home dose of thyroid replacement.     All the records are reviewed and case discussed with ED provider. Management plans discussed with the patient and/or family.  DVT PROPHYLAXIS: SubQ lovenox, SubQ heparin, systemic anticoagulation, mechanical only  CODE STATUS: DNR  TOTAL TIME TAKING CARE OF THIS PATIENT: Critical Care time 55 minutes.    Wiatt Mahabir Glen Bewick 05/01/2015, 10:31 AM  [facility]  Office  [phone]  CC: Primary care physician; Madelyn Brunner, MD

## 2015-05-01 NOTE — Progress Notes (Signed)
Update:  Patient's O2 sats have continued to fall from mid to high 80s to low 80s sometimes drifting down into the 60s and 70s when she is not taking deep breaths. We will put her on BiPAP for noninvasive ventilation at this time. We had to stop her fluids. Repeat conversation with the patient about CODE STATUS revealed that she is willing to be intubated or mechanical ventilation if needed for respiratory issues, but does not want CPR or ACLS medications or defibrillation in the event of a cardiac arrest.  Jacqulyn [facility] 05/01/2015, 10:52 AM

## 2015-05-01 NOTE — Progress Notes (Signed)
Pre-hd tx 

## 2015-05-01 NOTE — Consult Note (Signed)
Cardiology Consultation Note  Patient ID: SHALAINE PAYSON, MRN: [id number], DOB/AGE: July 14, 1941 74 y.o. Admit date: 05/01/2015   Date of Consult: 05/01/2015 Primary Physician: Madelyn Brunner, MD Primary Cardiologist: New to [facility]  Chief Complaint: Confusion, hypoglycemia Reason for Consult: Elevated troponin (2.35)  HPI: 74 y.o. female with h/o chronic diastolic CHF, ESRD on HD, HTN, HLD, COPD not on home O2, colonic perforation s/p repair 12/05/12, colon cancer status post colectomy, hypothyroidism, secondary hyperparathyroidism, and obesity who presented to [facility] on 5/4 after calling EMS 2/2 increased confusion. Upon EMS arrival she was found to have a blood glucose of 15 s/p glucose and arrival to [facility] she was found to have a glucose of 45. She was confused and diaphoretic.   She has never had an ischemic work up before. She has been having increased sweats over the past couple of days that increased on 5/4. Her energy has been declining and her daughter noted she has been confused. She has recently been dealing with a cough that is productive of yellow sputum. Afebrile. No chills. She notes increased SOB. No orthopnea. She has not had any chest pain. No palpitations. Her weight has remained the same.   Upon her arrival to [facility] ED she was found to have the above glucose level of 45, improved form 15 s/p glucose given in the field. Troponin of 2.35. Lactic acid of 10-->4.3. EKG as below. Lipase normal. CBC with hgb 10.6-->9.7. WBC 8.1-->6.1. Blood cultures have been drawn x 2. She required BiPAP. She required bolus of IV fluids. She missed HD this morning. She was found to be in metabolic acidosis and hypotensive. CXR with bibasilar subsegmental infiltrates. She was started on vancomycin and Zosyn. Cardiology was consulted for the above elevated troponin. She reports feeling better this afternoon.    Past Medical History  Diagnosis Date  . Hypertension   . Hyperlipidemia   . Thyroid disease    Hypothyroidism  . ESRD (end stage renal disease) on dialysis   . Autoimmune thrombocytopenia   . COPD (chronic obstructive pulmonary disease)   . Diverticulitis 2013 sept.      hospitalized  at [facility].  . Anginal pain   . Hypothyroidism   . Shortness of breath   . Pneumonia   . Chronic diastolic CHF (congestive heart failure)   . GERD (gastroesophageal reflux disease)   . Arthritis       Most Recent Cardiac Studies: Echo 09/19/2012:  Study Conclusions  - Left ventricle: The cavity size was normal. Wall thickness was normal. Systolic function was normal. The estimated ejection fraction was in the range of 60% to 65%. Wall motion was normal; there were no regional wall motion abnormalities. Doppler parameters are consistent with diastolic dysfunction. E/e' ratio is >10, suggesting elevated LV filling pressure. - Aortic valve: Mildly calcified leaflets. Peak and mean gradients are 21 and 11 mmHg, consistent with mild aortic stenosis. Based on an LVOT diameter of 1.9 cm, the calculated AVA is ~1.8 cm2. Valve area: 1.73cm^2(VTI). Valve area: 1.8cm^2 (Vmax). - Mitral valve: Heavily calcified annulus,mostly posterior. Sclerotic leaflets. Mild mitral stenosis. Peak and mean gradients are 14 and 6 mmHg, respectively. Valve area by pressure half-time: 2.5cm^2. Valve area by continuity equation (using LVOT flow): 1.76cm^2. - Left atrium: Moderately dilated (33 ml/m2). - Right ventricle: The cavity size was mildly dilated. - Right atrium: Moderate to severely dilated. - Tricuspid valve: Moderate regurgitation. - Pulmonary arteries: PA peak pressure: 43mm Hg (S). - Systemic veins: The IVC  measures >2.1 cm, but collapses >50%, suggesting an elevated RA pressure of 10 mmHg.   Surgical History:  Past Surgical History  Procedure Laterality Date  . Av fistula placement, brachiocephalic  96/22/2979    right arm  . Abdominal hysterectomy    . Colonoscopy  Dec  2013    colon cancer .. surgery  to be 2014  . Shuntogram Left 05/28/2013    Procedure: SHUNTOGRAM;  Surgeon: Conrad Palouse, MD;  Location: [facility] CATH LAB;  Service: Cardiovascular;  Laterality: Left;     Home Meds: Prior to Admission medications   Medication Sig Start Date End Date Taking? Authorizing Provider  acetaminophen (TYLENOL) 500 MG tablet Take 1,000 mg by mouth 2 (two) times daily as needed for headache.    Yes Historical Provider, MD  albuterol-ipratropium (COMBIVENT) 18-103 MCG/ACT inhaler Inhale 2 puffs into the lungs 2 (two) times daily as needed for wheezing or shortness of breath.    Yes Historical Provider, MD  calcium acetate (PHOSLO) 667 MG capsule Take 667 mg by mouth 3 (three) times daily with meals.   Yes Historical Provider, MD  cinacalcet (SENSIPAR) 30 MG tablet Take 30 mg by mouth every evening.    Yes Historical Provider, MD  diphenhydrAMINE (BENADRYL) 25 mg capsule Take 25 mg by mouth 3 (three) times daily as needed for itching or sleep.    Yes Historical Provider, MD  isosorbide mononitrate (IMDUR) 60 MG 24 hr tablet Take 60 mg by mouth daily.    Yes Historical Provider, MD  levothyroxine (SYNTHROID, LEVOTHROID) 125 MCG tablet Take 125 mcg by mouth daily.    Yes Historical Provider, MD  metoCLOPramide (REGLAN) 5 MG tablet Take 5 mg by mouth 2 (two) times daily before a meal.   Yes Historical Provider, MD  ondansetron (ZOFRAN) 4 MG tablet Take 4 mg by mouth every 8 (eight) hours as needed for nausea or vomiting.   Yes Historical Provider, MD  pantoprazole (PROTONIX) 40 MG tablet Take 40 mg by mouth 2 (two) times daily.   Yes Historical Provider, MD  sevelamer (RENVELA) 800 MG tablet Take 1,600-2,400 mg by mouth See admin instructions. Pt takes three tablets before meals and two tablets with snacks.   Yes Historical Provider, MD    Inpatient Medications:  . antiseptic oral rinse  7 mL Mouth Rinse q12n4p  . b complex-vitamin c-folic acid  1 tablet Oral QHS  . calcium  acetate  667 mg Oral TID WC  . chlorhexidine  15 mL Mouth Rinse BID  . cinacalcet  30 mg Oral QHS  . heparin  5,000 Units Subcutaneous 3 times per day  . levothyroxine  125 mcg Oral QAC breakfast  . piperacillin-tazobactam (ZOSYN)  IV  3.375 g Intravenous Q12H  . sevelamer carbonate  2,400 mg Oral See admin instructions  . vancomycin  1,000 mg Intravenous Once  . vancomycin  750 mg Intravenous Once      Allergies:  Allergies  Allergen Reactions  . Shellfish-Derived Products Anaphylaxis    History   Social History  . Marital Status: Married    Spouse Name: N/A  . Number of Children: N/A  . Years of Education: N/A   Occupational History  . Not on file.   Social History Main Topics  . Smoking status: Former Smoker    Types: Cigarettes  . Smokeless tobacco: Never Used  . Alcohol Use: No  . Drug Use: No  . Sexual Activity: Not on file   Other Topics Concern  .  Not on file   Social History Narrative     Family History  Problem Relation Age of Onset  . Heart disease Mother   . Cancer Father   . Kidney disease Brother      Review of Systems: Review of Systems  Constitutional: Positive for malaise/fatigue and diaphoresis. Negative for fever, chills and weight loss.  HENT: Positive for congestion.   Eyes: Negative for blurred vision.  Respiratory: Positive for cough, sputum production, shortness of breath and wheezing. Negative for hemoptysis.        Yellow sputum  Cardiovascular: Positive for leg swelling. Negative for chest pain, palpitations, orthopnea, claudication and PND.  Gastrointestinal: Positive for nausea. Negative for heartburn, vomiting, abdominal pain, diarrhea and constipation.  Skin: Negative for itching and rash.  Neurological: Positive for dizziness and weakness. Negative for headaches.       +confusion     Labs:  Recent Labs  05/01/15 0755  TROPONINI 2.35*   Lab Results  Component Value Date   WBC 6.1 05/01/2015   HGB 9.7* 05/01/2015    HCT 31.0* 05/01/2015   MCV 90.9 05/01/2015   PLT 105* 05/01/2015     Recent Labs Lab 05/01/15 0755  NA 138  K 4.3  CL 89*  CO2 20*  BUN 32*  CREATININE 6.95*  CALCIUM 8.5*  PROT 7.6  BILITOT 2.2*  ALKPHOS 75  ALT 13*  AST 60*  GLUCOSE 84   Lab Results  Component Value Date   CHOL 140 05/13/2011   HDL 36* 05/13/2011   LDLCALC  05/13/2011    81        Total Cholesterol/HDL:CHD Risk Coronary Heart Disease Risk Table                     Men   Women  1/2 Average Risk   3.4   3.3  Average Risk       5.0   4.4  2 X Average Risk   9.6   7.1  3 X Average Risk  23.4   11.0        Use the calculated Patient Ratio above and the CHD Risk Table to determine the patient's CHD Risk.        ATP III CLASSIFICATION (LDL):  <100     mg/dL   Optimal  100-129  mg/dL   Near or Above                    Optimal  130-159  mg/dL   Borderline  160-189  mg/dL   High  >190     mg/dL   Very High   TRIG 116 05/13/2011   No results found for: DDIMER  Radiology/Studies:  Dg Chest Port 1 View  05/01/2015   CLINICAL DATA:  Sepsis.  EXAM: PORTABLE CHEST - 1 VIEW  COMPARISON:  12/30/2014.  12/25/2014.  FINDINGS: Mediastinum and hilar structures are normal. Chronic interstitial prominence consistent with chronic interstitial lung disease. Bibasilar subsegmental atelectasis and/or infiltrates. Pleural parenchymal thickening again noted consistent with scarring. Cardiomegaly with normal pulmonary vascularity. No pleural effusion or pneumothorax.  IMPRESSION: 1. Chronic interstitial lung disease. Pleural parenchymal scarring . 2. Mild bibasilar subsegmental atelectasis and/or infiltrates. 3. Stable cardiomegaly.   Electronically Signed   By: Marcello Moores  Register   On: 05/01/2015 08:30    EKG: EKG from 7:32 a-fib, 89 bpm, incomplete RBBB, right axis deviation, TWI II, V4-V5, 1 mm st depression III, aVF. EKG 9:08 NSR with  PACs 77 bpm, incomplete RBBB, right axis deviation, 1 mm down slopping st depresiion  III, aVF, V6, TWI V2-V3  Weights: Filed Weights   05/01/15 0741 05/01/15 1155  Weight: 158 lb (71.668 kg) 173 lb 8 oz (78.7 kg)     Physical Exam: Blood pressure 99/87, pulse 69, temperature 97.7 F (36.5 C), temperature source Axillary, resp. rate 16, height 5\' 4"  (1.626 m), weight 173 lb 8 oz (78.7 kg), SpO2 92 %. Body mass index is 29.77 kg/(m^2). General: Well developed, well nourished, in no acute distress. Head: Normocephalic, atraumatic, sclera non-icteric, no xanthomas, nares are without discharge.  Neck: Negative for carotid bruits. JVD not elevated. Lungs: On BiPAP. Coarse breath sounds with bilateral rhonchi. Breathing is slightly labored. Heart: RRR with S1 S2. 2/6 systolic murmur. No rubs or gallops appreciated. Abdomen: Obese, soft, non-tender, non-distended with normoactive bowel sounds. No hepatomegaly. No rebound/guarding. No obvious abdominal masses. Msk:  Strength and tone appear normal for age. Extremities: No clubbing or cyanosis. No edema.  Distal pedal pulses are 2+ and equal bilaterally. Neuro: Alert and oriented X 3. No facial asymmetry. No focal deficit. Moves all extremities spontaneously. Psych:  Responds to questions appropriately with a normal affect.    Assessment and Plan:  1. Elevated troponin: -First troponin of 2.35, remaining 2 sets still to be drawn -It would be unlikely for demand ischemia to present with such an elevated troponin, though given her clinical picture of hypoglycemia to 15, PNA, sepsis, respiratory failure demand, and ESRD in the setting of missed HD supply demand ischemia cannot be excluded -If troponin continues to trend markedly upward would start heparin gtt -Once she is stable would evaluate further with cardiac cath -Check echo to evaluate LV function and wall motion  -Hypotension precludes starting of beta blocker  2. Respiratory failure/PNA/sepsis: -Requiring BiPAP currently -On Zosyn and vancomycin -She reports feeling  much better curenttly than when she presented   -Blood cultures pending -Per IM  3. Arrhythmia: -Tele shows NSR with frequent PACs, blocked PACs, and junctional escape beats -Start beta blocker when able  4. ESRD on HD: -Missed treatment today -Nephrology on board  5. Anemia of CKD: -Hgb 9.7, epo as inpatient   6. Hypotension: -See #2 -Precludes usage of BB    Signed, Adonna Horsley PA-C 05/01/2015, 3:30 PM

## 2015-05-01 NOTE — ED Notes (Signed)
CBG 160 

## 2015-05-01 NOTE — ED Notes (Signed)
0730 CBG 45

## 2015-05-01 NOTE — ED Notes (Signed)
Pt. Arrived to ed from home via ems. Reports that pt started experiencing cold sweats this AM. Per EMS pt had a CBG of 16. Pt was given gingerale and crackers. PT alert and oriented on arrival. Pt denies pain. Pt reports "not feeling well, like the sickness without being sick".  EMS denies that pt did not remain alert and oriented x 3 throughout transport.  Pt denies Pain on arrival. Reports pt is using home oxygen, 2.5L but is non-compliant. Saturation of 70's on 2.5L.

## 2015-05-01 NOTE — ED Notes (Signed)
CRITICAL VALUE ALERT  Critical value received:  Lactic Acid 10  Date of notification:  05/01/15  Time of notification:  0850  Critical value read back:Yes.    Nurse who received alert:  Ernst Spell   MD notified (1st page): Dr. Benjaman Lobe   Time of first page: N/A  MD notified (2nd page) N/A   Time of second page:N.A   Responding MD:  Dr. Benjaman Lobe   Time MD responded: [phone]

## 2015-05-01 NOTE — ED Provider Notes (Addendum)
[name] Provider Note    ____________________________________________  Time seen: 7:31 AM  I have reviewed the triage vital signs and the nursing notes. [name] was brought him red lights and sirens by EMS from her home. Her daughter called EMS. Evaluation and history is limited by the fact that there are no family members and the patient is having intermittent hypoglycemia.  HISTORY  Chief Complaint Hypoglycemia       HPI [name] is a 74 y.o. female with a chief complaint of decreased LOC. She awoke this morning weak and sweaty she felt sick and was unable to get out of bed. Her daughter called EMS. When EMS arrived they found her sugar to be 16. Negative Orange juice and crackers and repeated her Accu-Chek which was 86. She was brought to the emergency department red lights and sirens.  On arrival to the emergency department she again was diaphoretic and weak she was able to speak she said she didn't feel well and that she was nauseated. Her Accu-Chek was repeated and it was 45. IV fluid was started and 1 amp of D50 was given immediately in the emergency department.  She is not diabetic and she does not take any medication for diabetes. The patient denies fever although she feels nauseated and weak. She has been short of breath. She has had a cough with no sputum. She denies any pain. He is not having chest pain.  Her daughter was not initially in the emergency department to interview.  Reviewing her history of chronic kidney disease and noting that the patient is an end-stage renal disease on dialysis .       Past Medical History  Diagnosis Date  . Hypertension   . Hyperlipidemia   . Thyroid disease     Hypothyroidism  . Chronic kidney disease   . Autoimmune thrombocytopenia   . COPD (chronic obstructive pulmonary disease)   . Diverticulitis 2013 sept.      hospitalized  at [facility].  . Anginal pain   .  Hypothyroidism   . Shortness of breath   . Pneumonia   . CHF (congestive heart failure)   . GERD (gastroesophageal reflux disease)   . Arthritis     Patient Active Problem List   Diagnosis Date Noted  . ESRD on dialysis 05/11/2013  . Venous stenosis of left upper extremity 05/11/2013  . PSVT (paroxysmal supraventricular tachycardia) 04/22/2013  . Diverticulitis of sigmoid colon 04/22/2013  . Clostridium difficile colitis 04/22/2013  . Bacteremia due to Staphylococcus 04/22/2013  . Anemia 09/17/2012  . ESRD (end stage renal disease) on dialysis 09/17/2012  . HTN (hypertension) 09/17/2012  . COPD (chronic obstructive pulmonary disease) 09/17/2012  . Hypothyroidism 09/17/2012  . Nausea and vomiting 09/17/2012    Past Surgical History  Procedure Laterality Date  . Av fistula placement, brachiocephalic  24/23/5361    right arm  . Abdominal hysterectomy    . Colonoscopy  Dec 2013    colon cancer .. surgery  to be 2014  . Shuntogram Left 05/28/2013    Procedure: SHUNTOGRAM;  Surgeon: Conrad Tracy, MD;  Location: [facility] CATH LAB;  Service: Cardiovascular;  Laterality: Left;    Current Outpatient Rx  Name  Route  Sig  Dispense  Refill  . acetaminophen (TYLENOL) 500 MG tablet   Oral   Take 1,000 mg by mouth 2 (two) times daily as needed (for headaches).         Marland Kitchen albuterol-ipratropium (  COMBIVENT) 18-103 MCG/ACT inhaler   Inhalation   Inhale 2 puffs into the lungs 2 (two) times daily as needed for shortness of breath. For wheezing         . B Complex-C-Folic Acid (NEPHRO-VITE PO)   Oral   Take 1 tablet by mouth at bedtime.          . calcium acetate (PHOSLO) 667 MG capsule   Oral   Take 667 mg by mouth 3 (three) times daily with meals.           . cinacalcet (SENSIPAR) 30 MG tablet   Oral   Take 30 mg by mouth at bedtime.          . diphenhydrAMINE (BENADRYL) 25 mg capsule   Oral   Take 25 mg by mouth 3 (three) times daily as needed for itching or allergies. For  allergies/sleep         . Fluticasone-Salmeterol (ADVAIR) 250-50 MCG/DOSE AEPB   Inhalation   Inhale 1 puff into the lungs 2 (two) times daily as needed (for shortness of breath).         . isosorbide mononitrate (IMDUR) 60 MG 24 hr tablet   Oral   Take 60 mg by mouth at bedtime.          Marland Kitchen levothyroxine (SYNTHROID, LEVOTHROID) 125 MCG tablet   Oral   Take 125 mcg by mouth at bedtime.          . sevelamer (RENVELA) 800 MG tablet   Oral   Take 1,600-2,400 mg by mouth See admin instructions. Takes 3 tablets before meals and 2 tablets with snacks         . Tetrahydrozoline HCl (VISINE OP)   Right Eye   Place 1 drop into the right eye daily as needed (help clear vision).           Allergies Shellfish-derived products  Family History  Problem Relation Age of Onset  . Heart disease Mother   . Cancer Father   . Kidney disease Brother     Social History History  Substance Use Topics  . Smoking status: Former Smoker    Types: Cigarettes  . Smokeless tobacco: Never Used  . Alcohol Use: No    Review of Systems  Constitutional: Negative for fever. Eyes: Negative for visual changes. ENT: Negative for sore throat. Cardiovascular: Negative for chest pain. Respiratory: Positive for shortness of breath. Gastrointestinal: Negative for abdominal pain, vomiting and diarrhea. Positive for Nausea Genitourinary: Negative for dysuria. Musculoskeletal: Negative for back pain. Skin: Negative for rash. Neurological: Negative for headaches, focal weakness or numbness. Today for weakness and sweating   10-point ROS otherwise negative.  ____________________________________________   PHYSICAL EXAM:  VITAL SIGNS: ED Triage Vitals  Enc Vitals Group     BP 05/01/15 0741 101/73 mmHg     Pulse Rate 05/01/15 0741 82     Resp --      Temp 05/01/15 0741 97.5 F (36.4 C)     Temp Source 05/01/15 0741 Oral     SpO2 05/01/15 0741 72 %     Weight 05/01/15 0741 158 lb  (71.668 kg)     Height 05/01/15 0741 5\' 4"  (1.626 m)     Head Cir --      Peak Flow --      Pain Score 05/01/15 0743 0     Pain Loc --      Pain Edu? --    initial vital signs were evaluated  and her blood pressure was initially 101/73 she is afebrile she is hypoxic initially with an O2 sat of 72% on room air. ________   Constitutional: Alert and oriented. Sick appearing diaphoretic and weak in the emergency department. Eyes: Conjunctivae are normal. PERRL. Normal extraocular movements. ENT   Head: Normocephalic and atraumatic.   Nose: No congestion/rhinnorhea.   Mouth/Throat: Mucous membranes are dry   Neck: No stridor. Hematological/Lymphatic/Immunilogical: No cervical lymphadenopathy. Cardiovascular: Normal rate, regular rhythm. Normal and symmetric distal pulses are present in all extremities. Systolic ejection murmur, rubs, or gallops. Respiratory: Normal respiratory effort without tachypnea nor retractions. Breath sounds are clear and equal bilaterally. No wheezes/rales/rhonchi. Gastrointestinal: Soft and nontender. No distention. No abdominal bruits. There is no CVA tenderness. Genitourinary: Deferred Musculoskeletal: Nontender with normal range of motion in all extremities. No joint effusions.  No lower extremity tenderness has 2+ pitting edema.  Neurologic:  Normal speech and language. No gross focal neurologic deficits are appreciated. Speech is normal. No gait instability. Skin:  Skin is warm, dry and intact. No rash noted. Psychiatric: Mood and affect are normal. Speech and behavior are normal. Patient exhibits appropriate insight and judgment.         ________________    LABS (pertinent positives/negatives) stat labs  ___ Labs Reviewed  BLOOD GAS, VENOUS - Abnormal; Notable for the following:    pH, Ven 7.28 (*)    Acid-base deficit 4.4 (*)    All other components within normal limits  CBC WITH DIFFERENTIAL/PLATELET - Abnormal; Notable for the  following:    RBC 3.67 (*)    Hemoglobin 10.6 (*)    HCT 34.0 (*)    MCHC 31.3 (*)    RDW 20.4 (*)    Neutro Abs 6.9 (*)    Lymphs Abs 0.5 (*)    All other components within normal limits  LACTIC ACID, PLASMA - Abnormal; Notable for the following:    Lactic Acid, Venous 10.0 (*)    All other components within normal limits  PROTIME-INR - Abnormal; Notable for the following:    Prothrombin Time 19.8 (*)    All other components within normal limits  GLUCOSE, CAPILLARY - Abnormal; Notable for the following:    Glucose-Capillary 160 (*)    All other components within normal limits  CULTURE, BLOOD (ROUTINE X 2)  CULTURE, BLOOD (ROUTINE X 2)  URINE CULTURE  CULTURE, EXPECTORATED SPUTUM-ASSESSMENT  COMPREHENSIVE METABOLIC PANEL  TROPONIN I  LACTIC ACID, PLASMA  URINALYSIS COMPLETEWITH MICROSCOPIC ([facility])   BRAIN NATRIURETIC PEPTIDE  CBG MONITORING, ED  CBG MONITORING, ED  CBG MONITORING, ED  CBG MONITORING, ED  CBG MONITORING, ED  CBG MONITORING, ED  CBG MONITORING, ED  CBG MONITORING, ED  CBG MONITORING, ED  CBG MONITORING, ED  CBG MONITORING, ED  CBG MONITORING, ED  CBG MONITORING, ED  CBG MONITORING, ED  CBG MONITORING, ED  CBG MONITORING, ED  CBG MONITORING, ED  TYPE AND SCREEN  _________________________________________ Lab abnormalities include; ----Patient is acidotic with a pH of 7.28. Anemic with a hemoglobin of 10 Lactate level is 10 CO2 is low at 20 consistent with acidosis BUN/creatinine are elevated creatinine is 6.95 with a UN of 32 consistent with renal failure Her anion gap is 29 Her bilirubin is elevated at 2.2   her liver function tests are abnormal with an AST of 60 and a LT of 13   ------------------------------------- 9:05 AM on 05/01/2015 -----------------------------------------       EKG  ED ECG REPORT  Date: 05/01/2015  EKG Time: 7:32 AM  Rate: 89  Rhythm: Atrial fibrillation  Axis: Normal  Intervals:right bundle branch  block  ST&T Change: Nonspecific ST-T wave changes ST-T wave abnormality in the inferior and anterior lateral leads consider ischemia. ED ECG REPORT   Date: 05/01/2015  EKG Time: 9:08 AM  Rate: 77  Rhythm: normal EKG, normal sinus rhythm, unchanged from previous tracings, atrial fibrillation, rate 97  Axis: Normal  Intervals:right bundle branch block  ST&T Change ST-T wave changes in the inferior and anterolateral leads may be ischemia.   ____________________________________________    RADIOLOGY  Bibasal infiltrates consistent with bilateral pneumonia Chronic interstitial disease and scarring Cardiomegaly  ____________________________________________   PROCEDURES  Procedure(s) performed: None  Critical Care performed: Yes, see critical care note(s) CRITICAL CARE Performed by: Ferman Hamming   Total critical care time: 60 Critical care time was exclusive of separately billable procedures and treating other patients.  Critical care was necessary to treat or prevent imminent or life-threatening deterioration.  Critical care was time spent personally by me on the following activities: development of treatment plan with patient and/or surrogate as well as nursing, discussions with consultants, evaluation of patient's response to treatment, examination of patient, obtaining history from patient or surrogate, ordering and performing treatments and interventions, ordering and review of laboratory studies, ordering and review of radiographic studies, pulse oximetry and re-evaluation of patient's condition. ____________________________________________   INITIAL IMPRESSION / ASSESSMENT AND PLAN / ED COURSE  Pertinent labs & imaging results that were available during my care of the patient were reviewed by me and considered in my medical decision making (see chart for details).  This is a 74 year old woman who was brought to the emergency department with acute hypoglycemia not  diabetic. She is a chronically ill woman on hemodialysis according to Dr. [place] the nephrologists her hemodynamics dialysis schedule is Monday Wednesday Friday, which would mean that she was dialyzed yesterday. She  She is confused in the emergency department and doesn't remember if she was dialyzed yesterday.  She is put on 100% non-rebreather for her hypoxia at 72% her oxygen is 97% on nonrebreather.  Her blood pressure remained stable throughout the ER visit.  Due to the hypoglycemia and a non-diabetic patient and the profound acidosis. she is being treated empirically for sepsis initially with an unknown etiology. Her chest x-ray in the emergency department does reveal a bilateral pneumonia. So she will be treated for this.  She received broad-spectrum antibiotic for sepsis and fluid bolus.  She received D50 W bolus IV in the emergency department and her sugar improved at known 905 the CBG was 119.  She was reassessed multiple times in the emergency department.  She will be seen in the emergency department by the primary doctor Dr. Jannifer Franklin who is called for admission at 9:22 AM.  Dr. Jobe Gibbon the nephrologist was consulted.  Troponin was elevated consistent with a non-ST elevation MI. She received aspirin in the emergency department.  He will be admitted to the hospital by Dr. Jannifer Franklin. ____________________________________________   FINAL CLINICAL IMPRESSION(S) / ED DIAGNOSES  Final diagnoses:  None   1. Sepsis acute 2. Bilateral pneumonia acute 3. End-stage renal failure 4. Acute hypoglycemia in a nondiabetic patient   Boris Lown, DO 05/01/15 [place], DO 05/01/15 1020

## 2015-05-01 NOTE — ED Notes (Signed)
MD notified about pt's currently vitals and status. Gave verbal order to notify RT in order for pt to be placed on Bipap. Pt alert and oriented.

## 2015-05-01 NOTE — Progress Notes (Signed)
[facility]  ROUNDING NOTE   Subjective:   [name] woke up this morning with hypoglycemia. She was brought to the [facility] ED whre she was placed on BiPAP. She was given bolus of IV fluids. Found to be in metabolic acidosis.  Missed hemodialysis this morning.   Objective:  Vital signs in last 24 hours:  Temp:  [97.5 F (36.4 C)-97.7 F (36.5 C)] 97.7 F (36.5 C) (05/05 1155) Pulse Rate:  [69-87] 69 (05/05 1155) Resp:  [16-43] 16 (05/05 1155) BP: (99-139)/(35-87) 99/87 mmHg (05/05 1155) SpO2:  [72 %-98 %] 92 % (05/05 1155) FiO2 (%):  [80 %] 80 % (05/05 1155) Weight:  [71.668 kg (158 lb)-78.7 kg (173 lb 8 oz)] 78.7 kg (173 lb 8 oz) (05/05 1155)  Weight change:  Filed Weights   05/01/15 0741 05/01/15 1155  Weight: 71.668 kg (158 lb) 78.7 kg (173 lb 8 oz)    Intake/Output:     Intake/Output this shift:  Total I/O In: 1350 [IV Piggyback:1350] Out: 2 [Urine:2]  Physical Exam: General: Critically ill, on BiPAP  Head: Normocephalic, atraumatic.  Eyes: Anicteric, PERRL  Neck: Supple, trachea midline  Lungs:  Diminished bilaterally  Heart: Regular rate and rhythm  Abdomen:  Soft, nontender,   Extremities: no peripheral edema.  Neurologic: Nonfocal, moving all four extremities  Skin: No lesions  Access: Left arm AVF    Basic Metabolic Panel:  Recent Labs Lab 05/01/15 0755  NA 138  K 4.3  CL 89*  CO2 20*  GLUCOSE 84  BUN 32*  CREATININE 6.95*  CALCIUM 8.5*    Liver Function Tests:  Recent Labs Lab 05/01/15 0755  AST 60*  ALT 13*  ALKPHOS 75  BILITOT 2.2*  PROT 7.6  ALBUMIN 3.6    Recent Labs Lab 05/01/15 1232  LIPASE 38   No results for input(s): AMMONIA in the last 168 hours.  CBC:  Recent Labs Lab 05/01/15 0755 05/01/15 1232  WBC 8.1 6.1  NEUTROABS 6.9*  --   HGB 10.6* 9.7*  HCT 34.0* 31.0*  MCV 92.7 90.9  PLT 154 105*    Cardiac Enzymes:  Recent Labs Lab 05/01/15 0755  TROPONINI 2.35*    BNP: Invalid  input(s): POCBNP  CBG:  Recent Labs Lab 05/01/15 0804 05/01/15 0905 05/01/15 1003 05/01/15 1159  GLUCAP 160* 119* 138* 106*    Microbiology: Results for orders placed or performed during the hospital encounter of 05/01/15  Culture, sputum-assessment     Status: None (Preliminary result)   Collection Time: 05/01/15  9:00 AM  Result Value Ref Range Status   Specimen Description SPUTUM  Final   Special Requests Normal  Final   Sputum evaluation   Final    Sputum specimen not acceptable for testing.  Please recollect.   Results Called to: [facility] AT 4081 05/01/15 LAB    Report Status PENDING  Incomplete    Coagulation Studies:  Recent Labs  05/01/15 0755  LABPROT 19.8*  INR 1.66    Urinalysis:  Recent Labs  05/01/15 0900  COLORURINE PINK*  LABSPEC 1.015  PHURINE 0.0*  GLUCOSEU TEST NOT REPORTED DUE TO COLOR INTERFERENCE OF URINE PIGMENT*  HGBUR TEST NOT REPORTED DUE TO COLOR INTERFERENCE OF URINE PIGMENT*  BILIRUBINUR TEST NOT REPORTED DUE TO COLOR INTERFERENCE OF URINE PIGMENT*  KETONESUR TEST NOT REPORTED DUE TO COLOR INTERFERENCE OF URINE PIGMENT*  PROTEINUR TEST NOT REPORTED DUE TO COLOR INTERFERENCE OF URINE PIGMENT*  NITRITE TEST NOT REPORTED DUE TO COLOR  INTERFERENCE OF URINE PIGMENT*  LEUKOCYTESUR TEST NOT REPORTED DUE TO COLOR INTERFERENCE OF URINE PIGMENT*      Imaging: Dg Chest Port 1 View  05/01/2015   CLINICAL DATA:  Sepsis.  EXAM: PORTABLE CHEST - 1 VIEW  COMPARISON:  12/30/2014.  12/25/2014.  FINDINGS: Mediastinum and hilar structures are normal. Chronic interstitial prominence consistent with chronic interstitial lung disease. Bibasilar subsegmental atelectasis and/or infiltrates. Pleural parenchymal thickening again noted consistent with scarring. Cardiomegaly with normal pulmonary vascularity. No pleural effusion or pneumothorax.  IMPRESSION: 1. Chronic interstitial lung disease. Pleural parenchymal scarring . 2. Mild bibasilar subsegmental  atelectasis and/or infiltrates. 3. Stable cardiomegaly.   Electronically Signed   By: Marcello Moores  Register   On: 05/01/2015 08:30     Medications:     . antiseptic oral rinse  7 mL Mouth Rinse q12n4p  . b complex-vitamin c-folic acid  1 tablet Oral QHS  . calcium acetate  667 mg Oral TID WC  . chlorhexidine  15 mL Mouth Rinse BID  . cinacalcet  30 mg Oral QHS  . heparin  5,000 Units Subcutaneous 3 times per day  . levothyroxine  125 mcg Oral QAC breakfast  . piperacillin-tazobactam (ZOSYN)  IV  3.375 g Intravenous Q12H  . sevelamer carbonate  1,600-2,400 mg Oral See admin instructions  . vancomycin  750 mg Intravenous Once   acetaminophen **OR** acetaminophen, ipratropium-albuterol, ondansetron **OR** ondansetron (ZOFRAN) IV, senna-docusate, tetrahydrozoline  Assessment/ Plan:   [name] is a 74 y.o. black female with end-stage renal disease on HD since 2002, hypertension, hypothyroidism, emphysema, AOCD, secondary hyperparathyroidism, colonic perforation s/p repair 12/05/12, colon cancer status post colectomy   Admitted for hypoglycemia and pulmonary edema on 05/01/2015  [facility] [place] TTS first shift  1. ESRD on HD TTHS: will schedule patient for hemodialysis today. Currently with noncardiogenic pulmonary edema. Potassium of 4.3 - UF goal of 2 litres.  - evaluated daily for dialysis need.   2. Anemia of CKD: hemoglobin 9.7. micera as outpatient.  - epo with inpatient. Hemodialysis treatment.   3. SHPTH: continue cinacalcet and calcium acetate - Check phos and PTH  4. Hypotension: concern for sepsis with increased lactic acid level. Metabolic acidosis. ABG reviewed.  - empiric antibiotics with zosyn and vancomycin. Renally dosed. Pharmacy following.     LOS: 0 Cortnie Ringel 5/5/20163:14 PM

## 2015-05-01 NOTE — Progress Notes (Signed)
HD tx completed.

## 2015-05-01 NOTE — ED Notes (Signed)
MD at bedside. Nephrology at bedside comfirms pt's currently status and verbalized to pt about plan of care. Pt alert and oriented and verbalized understanding of MD's orders.

## 2015-05-01 NOTE — ED Notes (Signed)
CRITICAL VALUE ALERT  Critical value received:  Troponin 2.35  Date of notification:  05/01/15  Time of notification:  0900  Critical value read back:Yes.    Nurse who received alert:  Ernst Spell   MD notified (1st page): Dr. Benjaman Lobe   Time of first page: Na   MD notified (2nd page): N/A   Time of second page: N/a   Responding MD: Dr. Benjaman Lobe   Time MD responded: 0900

## 2015-05-01 NOTE — Progress Notes (Signed)
[facility] NOTE  Pharmacy Consult for Vancomycin, Zosyn, ESRD medication adjustment  Indication:    Allergies  Allergen Reactions  . Shellfish-Derived Products Anaphylaxis    Patient Measurements: Height: 5\' 4"  (162.6 cm) Weight: 173 lb 8 oz (78.7 kg) IBW/kg (Calculated) : 54.7 Adjusted Body Weight:   Vital Signs: Temp: 97.7 F (36.5 C) (05/05 1155) Temp Source: Axillary (05/05 1155) BP: 99/87 mmHg (05/05 1155) Pulse Rate: 69 (05/05 1155) Intake/Output from previous day:   Intake/Output from this shift: Total I/O In: 1350 [IV Piggyback:1350] Out: 2 [Urine:2] Vent settings for last 24 hours: Vent Mode:  [-]  FiO2 (%):  [80 %] 80 %  Labs:  Recent Labs  05/01/15 0755 05/01/15 1232  WBC 8.1 6.1  HGB 10.6* 9.7*  HCT 34.0* 31.0*  PLT 154 105*  INR 1.66  --   CREATININE 6.95*  --   ALBUMIN 3.6  --   PROT 7.6  --   AST 60*  --   ALT 13*  --   ALKPHOS 75  --   BILITOT 2.2*  --    Estimated Creatinine Clearance: 7.1 mL/min (by C-G formula based on Cr of 6.95).   Recent Labs  05/01/15 0905 05/01/15 1003 05/01/15 1159  GLUCAP 119* 138* 106*    Microbiology: Recent Results (from the past 720 hour(s))  Culture, sputum-assessment     Status: None (Preliminary result)   Collection Time: 05/01/15  9:00 AM  Result Value Ref Range Status   Specimen Description SPUTUM  Final   Special Requests Normal  Final   Sputum evaluation   Final    Sputum specimen not acceptable for testing.  Please recollect.   Results Called to: Loletha Grayer AT 4818 05/01/15 LAB    Report Status PENDING  Incomplete    Medications:  Scheduled:  . b complex-vitamin c-folic acid  1 tablet Oral QHS  . calcium acetate  667 mg Oral TID WC  . cinacalcet  30 mg Oral QHS  . heparin  5,000 Units Subcutaneous 3 times per day  . levothyroxine  125 mcg Oral QAC breakfast  . piperacillin-tazobactam (ZOSYN)  IV  3.375 g Intravenous Q12H  . sevelamer carbonate  1,600-2,400 mg  Oral See admin instructions  . vancomycin  750 mg Intravenous Once   Infusions:  . sodium chloride     Anti-infectives    Start     Dose/Rate Route Frequency Ordered Stop   05/01/15 2000  piperacillin-tazobactam (ZOSYN) IVPB 3.375 g     3.375 g 12.5 mL/hr over 240 Minutes Intravenous Every 12 hours 05/01/15 1427     05/01/15 1500  vancomycin (VANCOCIN) IVPB 750 mg/150 ml premix    Comments:  Please give in addition to already given 1gm dose for total loading dose of 1750mg .   750 mg 150 mL/hr over 60 Minutes Intravenous  Once 05/01/15 1427     05/01/15 1426  vancomycin (VANCOCIN) IVPB 1000 mg/200 mL premix     1,000 mg 200 mL/hr over 60 Minutes Intravenous Every Dialysis 05/01/15 1427     05/01/15 0800  piperacillin-tazobactam (ZOSYN) IVPB 3.375 g     3.375 g 100 mL/hr over 30 Minutes Intravenous  Once 05/01/15 0756 05/01/15 0908   05/01/15 0800  vancomycin (VANCOCIN) IVPB 1000 mg/200 mL premix     1,000 mg 200 mL/hr over 60 Minutes Intravenous  Once 05/01/15 0756 05/01/15 1040      Patient is 74yo F admitted for possible sepsis from HCAP  with ESRD on HD and unkown schedule at this time. Vancomycin and zosyn started empirically for possible HCAP.  May be converted to CRRT.    BCx, Sputum Cx, Urine Cx pending.  Vancomycin loading dose of 1750mg  IV once ordered Vancomycin maintenance dose of 1000mg  IV QHD ordered Trough will be ordered before 3rd HD session when schedule is known Zosyn 3.375gm IV Q12hrs ordered   All other medications reviewed and appropriate for renal function at this time.  Pharmacy will continue to follow.   [address] [place], [place] D., BCPS 05/01/2015

## 2015-05-01 NOTE — ED Notes (Signed)
CBG 119 

## 2015-05-01 NOTE — Progress Notes (Signed)
HD tx start 

## 2015-05-01 NOTE — Progress Notes (Signed)
Post hd 

## 2015-05-02 DIAGNOSIS — I214 Non-ST elevation (NSTEMI) myocardial infarction: Secondary | ICD-10-CM

## 2015-05-02 LAB — GLUCOSE, CAPILLARY
GLUCOSE-CAPILLARY: 86 mg/dL (ref 70–99)
GLUCOSE-CAPILLARY: 92 mg/dL (ref 70–99)
Glucose-Capillary: 103 mg/dL — ABNORMAL HIGH (ref 70–99)

## 2015-05-02 LAB — CBC
HCT: 29.7 % — ABNORMAL LOW (ref 35.0–47.0)
Hemoglobin: 9.5 g/dL — ABNORMAL LOW (ref 12.0–16.0)
MCH: 28.6 pg (ref 26.0–34.0)
MCHC: 31.9 g/dL — ABNORMAL LOW (ref 32.0–36.0)
MCV: 89.7 fL (ref 80.0–100.0)
PLATELETS: 119 10*3/uL — AB (ref 150–440)
RBC: 3.32 MIL/uL — AB (ref 3.80–5.20)
RDW: 19.6 % — AB (ref 11.5–14.5)
WBC: 3.4 10*3/uL — ABNORMAL LOW (ref 3.6–11.0)

## 2015-05-02 LAB — LACTIC ACID, PLASMA
LACTIC ACID, VENOUS: 1.7 mmol/L (ref 0.5–2.0)
Lactic Acid, Venous: 1.6 mmol/L (ref 0.5–2.0)
Lactic Acid, Venous: 2.5 mmol/L (ref 0.5–2.0)

## 2015-05-02 LAB — BASIC METABOLIC PANEL
Anion gap: 12 (ref 5–15)
BUN: 20 mg/dL (ref 6–20)
CHLORIDE: 96 mmol/L — AB (ref 101–111)
CO2: 32 mmol/L (ref 22–32)
CREATININE: 4.39 mg/dL — AB (ref 0.44–1.00)
Calcium: 8.2 mg/dL — ABNORMAL LOW (ref 8.9–10.3)
GFR calc Af Amer: 10 mL/min — ABNORMAL LOW (ref >60)
GFR calc non Af Amer: 9 mL/min — ABNORMAL LOW (ref >60)
Glucose, Bld: 96 mg/dL (ref 65–99)
Potassium: 3.3 mmol/L — ABNORMAL LOW (ref 3.5–5.1)
SODIUM: 140 mmol/L (ref 135–145)

## 2015-05-02 LAB — TROPONIN I: Troponin I: 3.2 ng/mL — ABNORMAL HIGH (ref <0.031)

## 2015-05-02 MED ORDER — VANCOMYCIN HCL IN DEXTROSE 1-5 GM/200ML-% IV SOLN
1000.0000 mg | INTRAVENOUS | Status: DC
  Administered 2015-05-03: 1000 mg via INTRAVENOUS
  Filled 2015-05-02 (×2): qty 200

## 2015-05-02 MED ORDER — NEPRO/CARBSTEADY PO LIQD
237.0000 mL | ORAL | Status: DC
  Administered 2015-05-04 – 2015-05-06 (×3): 237 mL via ORAL

## 2015-05-02 MED ORDER — PANTOPRAZOLE SODIUM 40 MG PO TBEC
40.0000 mg | DELAYED_RELEASE_TABLET | Freq: Every day | ORAL | Status: DC
  Administered 2015-05-03 – 2015-05-06 (×4): 40 mg via ORAL
  Filled 2015-05-02 (×4): qty 1

## 2015-05-02 NOTE — Progress Notes (Signed)
[facility] NOTE  Pharmacy Consult for Vancomycin, Zosyn, ESRD medication adjustment  Indication:    Allergies  Allergen Reactions  . Shellfish-Derived Products Anaphylaxis    Patient Measurements: Height: 5\' 4"  (162.6 cm) Weight: 172 lb 13.5 oz (78.4 kg) IBW/kg (Calculated) : 54.7 Adjusted Body Weight:   Vital Signs: BP: 120/67 mmHg (05/06 1100) Pulse Rate: 76 (05/06 1100) Intake/Output from previous day: 05/05 0701 - 05/06 0700 In: 1910 [P.O.:360; IV Piggyback:1550] Out: 2002 [Urine:2] Intake/Output from this shift: Total I/O In: 50 [IV Piggyback:50] Out: -  Vent settings for last 24 hours: Vent Mode:  [-]  FiO2 (%):  [0.7 %-80 %] 5 %  Labs:  Recent Labs  05/01/15 0755 05/01/15 1232 05/02/15 0630  WBC 8.1 6.1 3.4*  HGB 10.6* 9.7* 9.5*  HCT 34.0* 31.0* 29.7*  PLT 154 105* 119*  INR 1.66  --   --   CREATININE 6.95* 6.63* 4.39*  MG  --  1.9  --   PHOS  --  7.0*  --   ALBUMIN 3.6  --   --   PROT 7.6  --   --   AST 60*  --   --   ALT 13*  --   --   ALKPHOS 75  --   --   BILITOT 2.2*  --   --    Estimated Creatinine Clearance: 11.2 mL/min (by C-G formula based on Cr of 4.39).   Recent Labs  05/01/15 2243 05/02/15 0718 05/02/15 1123  GLUCAP 61* 86 103*    Microbiology: Recent Results (from the past 720 hour(s))  Blood Culture (routine x 2)     Status: None (Preliminary result)   Collection Time: 05/01/15  7:55 AM  Result Value Ref Range Status   Specimen Description BLOOD  Final   Special Requests Normal  Final   Culture NO GROWTH 1 DAY  Final   Report Status PENDING  Incomplete  Blood Culture (routine x 2)     Status: None (Preliminary result)   Collection Time: 05/01/15  7:57 AM  Result Value Ref Range Status   Specimen Description BLOOD  Final   Special Requests Normal  Final   Culture NO GROWTH 1 DAY  Final   Report Status PENDING  Incomplete  Urine culture     Status: None (Preliminary result)   Collection Time:  05/01/15  9:00 AM  Result Value Ref Range Status   Specimen Description URINE, RANDOM  Final   Special Requests Normal  Final   Culture HOLDING FOR POSSIBLE PATHOGEN ONCE ISOLATED   Final   Report Status PENDING  Incomplete  Culture, sputum-assessment     Status: None (Preliminary result)   Collection Time: 05/01/15  9:00 AM  Result Value Ref Range Status   Specimen Description SPUTUM  Final   Special Requests Normal  Final   Sputum evaluation   Final    Sputum specimen not acceptable for testing.  Please recollect.   Results Called to: Loletha Grayer AT 6712 05/01/15 LAB    Report Status PENDING  Incomplete    Medications:  Scheduled:  . antiseptic oral rinse  7 mL Mouth Rinse q12n4p  . b complex-vitamin c-folic acid  1 tablet Oral QHS  . calcium acetate  667 mg Oral TID WC  . chlorhexidine  15 mL Mouth Rinse BID  . cinacalcet  30 mg Oral QHS  . heparin  5,000 Units Subcutaneous 3 times per day  . levothyroxine  125  mcg Oral QAC breakfast  . piperacillin-tazobactam (ZOSYN)  IV  3.375 g Intravenous Q12H  . sevelamer carbonate  2,400 mg Oral TID WC  . [START ON 05/03/2015] vancomycin  1,000 mg Intravenous Q T,Th,Sa-HD   Infusions:    Anti-infectives    Start     Dose/Rate Route Frequency Ordered Stop   05/03/15 1200  vancomycin (VANCOCIN) IVPB 1000 mg/200 mL premix     1,000 mg 200 mL/hr over 60 Minutes Intravenous Every T-Th-Sa (Hemodialysis) 05/02/15 1247     05/01/15 2000  piperacillin-tazobactam (ZOSYN) IVPB 3.375 g     3.375 g 12.5 mL/hr over 240 Minutes Intravenous Every 12 hours 05/01/15 1427     05/01/15 1800  vancomycin (VANCOCIN) IVPB 1000 mg/200 mL premix     1,000 mg 200 mL/hr over 60 Minutes Intravenous  Once 05/01/15 1516 05/01/15 2009   05/01/15 1500  vancomycin (VANCOCIN) IVPB 750 mg/150 ml premix    Comments:  Please give in addition to already given 1gm dose for total loading dose of 1750mg .   750 mg 150 mL/hr over 60 Minutes Intravenous  Once 05/01/15 1427  05/01/15 1635   05/01/15 1426  vancomycin (VANCOCIN) IVPB 1000 mg/200 mL premix  Status:  Discontinued     1,000 mg 200 mL/hr over 60 Minutes Intravenous Every Dialysis 05/01/15 1427 05/01/15 1510   05/01/15 0800  piperacillin-tazobactam (ZOSYN) IVPB 3.375 g     3.375 g 100 mL/hr over 30 Minutes Intravenous  Once 05/01/15 0756 05/01/15 0908   05/01/15 0800  vancomycin (VANCOCIN) IVPB 1000 mg/200 mL premix     1,000 mg 200 mL/hr over 60 Minutes Intravenous  Once 05/01/15 0756 05/01/15 1040      Patient is 74yo F admitted for possible sepsis from HCAP with ESRD on HD and unkown schedule at this time. Vancomycin and zosyn started empirically for possible HCAP.  May be converted to CRRT.    BCx: negative Sputum Cx: Pending Urine Cx: pending.  Vancomycin loading dose of 1750mg  IV once ordered Vancomycin maintenance dose of 1000mg  IV QTTS ordered Trough will be ordered before 3rd HD session which will be 5/10 before HD Zosyn 3.375gm IV Q12hrs ordered as EI.  All other medications reviewed and appropriate for renal function at this time.  Pharmacy will continue to follow.   [address] [place], [place] D., BCPS 05/02/2015

## 2015-05-02 NOTE — Progress Notes (Signed)
Initial Nutrition Assessment   INTERVENTION: Medical Nutrition Supplement Therapy: will recommend Nepro shake daily  Meals and Snacks: Cater to patient preferences  NUTRITION DIAGNOSIS:  Inadequate oral intake related to acute illness as evidenced by estimated needs.  GOAL:  Patient will meet greater than or equal to 90% of their needs  MONITOR:  PO intake, Supplement acceptance, Labs, I & O's, Weight trends, Electrolyte and renal Profile, Digestive system  REASON FOR ASSESSMENT:  Other (Comment) (RD Screen Renal Diet)    ASSESSMENT:  Pt admitted with hypoglycemia, per MD note chest xray consistent with pna on bipap at admission currently on [place]. PMHx: HTN, ESRD on HD, HTn, HLD, thyroid disease, CKD, COPD, diverticulitis, CHF, GERD  PO Intake: pt ate 50% of chicken salad sandwich at lunch today. Pt reports 'I eat like a mouse' PTA but multiple meals per day.  Medications: Phoslo, Renvela, Sensipar, Nephro-vite  Labs: Electrolyte and Renal Profile:    Recent Labs Lab 05/01/15 0755 05/01/15 1232 05/02/15 0630  BUN 32*  --  20  CREATININE 6.95* 6.63* 4.39*  NA 138  --  140  K 4.3  --  3.3*  MG  --  1.9  --   PHOS  --  7.0*  --    Glucose Profile:  Recent Labs  05/01/15 2243 05/02/15 0718 05/02/15 1123  GLUCAP 61* 86 103*   Protein Profile:  Recent Labs Lab 05/01/15 0755  ALBUMIN 3.6    Height:  Ht Readings from Last 1 Encounters:  05/01/15 5\' 4"  (1.626 m)    Weight:  Wt Readings from Last 1 Encounters:  05/02/15 172 lb 13.5 oz (78.4 kg)    Wt Readings from Last 10 Encounters:  05/02/15 172 lb 13.5 oz (78.4 kg)  05/11/13 167 lb (75.751 kg)  04/22/13 170 lb 13.7 oz (77.5 kg)  12/14/12 163 lb 12.8 oz (74.3 kg)   Pt reports Usual dry weight of 76.5kg PTA  BMI:  Body mass index is 29.65 kg/(m^2).   Nutrition-Focused physical exam completed. Findings are WDL for  fat depletion, muscle depletion, and edema.   Estimated Nutritional  Needs:  Kcal:  1476-1722kcals, BEE: 1025kcals, (IF 1.2-1.4)(AF 1.2) using IBW of 54.5kg  Protein:  65-82g protein, (1.2-1.5g/kg) using IBW of 54.5kg  Fluid:  UOP+1019mL  Diet Order:  Diet renal/carb modified with fluid restriction Diet-HS Snack?: Nothing; Room service appropriate?: Yes; Fluid consistency:: Thin   Intake/Output Summary (Last 24 hours) at 05/02/15 1521 Last data filed at 05/02/15 0959  Gross per 24 hour  Intake    610 ml  Output   2000 ml  Net  -1390 ml   Output: 2L removed via HD yesterday Last BM:  5/5 watery black stool  MODERATE Care Level  Dwyane Luo, RD, LDN Pager [phone]

## 2015-05-02 NOTE — Progress Notes (Signed)
Dr Lavetta Nielsen notified of lactic acid 2.5; no orders received. Also pt NPO throughout day due to bipap, 2100 fingerstick blood sugar 54; given 2 glasses apple juice and [place] sandwich, applesauce. Repeat blood sugar 61. Pt asymptomatic throughout, alert, oriented, appropriate. Awaiting lab draw serum glucose at this time.

## 2015-05-02 NOTE — Progress Notes (Signed)
[facility] [place] NAME: [name]    MR#:  [id number]  DATE OF BIRTH:  October 16, 1941  SUBJECTIVE:  "i feel a lot better' now off BIPAP. Ate good BF. No cp  REVIEW OF SYSTEMS:   Review of Systems  Constitutional: Positive for malaise/fatigue. Negative for fever, chills and weight loss.  HENT: Negative for congestion and sore throat.   Eyes: Negative for pain, discharge and redness.  Respiratory: Positive for cough, sputum production and shortness of breath. Negative for stridor.   Cardiovascular: Negative for chest pain, palpitations, orthopnea and leg swelling.  Gastrointestinal: Negative for heartburn, nausea, vomiting and abdominal pain.  Genitourinary: Positive for frequency. Negative for flank pain.  Musculoskeletal: Positive for back pain. Negative for joint pain and falls.  Neurological: Positive for weakness. Negative for dizziness, tingling, seizures and loss of consciousness.  Psychiatric/Behavioral: Negative for memory loss. The patient is not nervous/anxious and does not have insomnia.   All other systems reviewed and are negative.  Nutrition: po diet Tolerating PT:not evaluated yet Tolerating diet: yes  DRUG ALLERGIES:   Allergies  Allergen Reactions  . Shellfish-Derived Products Anaphylaxis    VITALS:  Blood pressure 120/67, pulse 76, temperature 97.5 F (36.4 C), temperature source Axillary, resp. rate 12, height 5\' 4"  (1.626 m), weight 78.4 kg (172 lb 13.5 oz), SpO2 91 %.  PHYSICAL EXAMINATION:  GENERAL:  74 y.o.-year-old patient lying in the bed with no acute distress.  EYES: Pupils equal, round, reactive to light and accommodation. No scleral icterus. Extraocular muscles intact.  HEENT: Head atraumatic, normocephalic. Oropharynx and nasopharynx clear.  NECK:  Supple, no jugular venous distention. No thyroid enlargement, no tenderness.  LUNGS: decreased bs bilaterally, coarsed,no wheezing - No use of  accessory muscles of respiration.  CARDIOVASCULAR: S1, S2 normal. No murmurs, rubs, or gallops.  ABDOMEN: Soft, nontender, nondistended. Bowel sounds present. No organomegaly or mass.  EXTREMITIES: + pedal edema, cyanosis, or clubbing.  NEUROLOGIC: Cranial nerves II through XII are intact. Muscle strength 5/5 in all extremities. Sensation intact. Gait not checked.  PSYCHIATRIC: The patient is alert and oriented x 3.  SKIN: No obvious rash, lesion, or ulcer.    LABORATORY PANEL:   CBC  Recent Labs Lab 05/01/15 1232 05/02/15 0630  WBC 6.1 3.4*  HGB 9.7* 9.5*  HCT 31.0* 29.7*  PLT 105* 119*   ------------------------------------------------------------------------------------------------------------------  Chemistries   Recent Labs Lab 05/01/15 0755 05/01/15 1232 05/02/15 0630  NA 138  --  140  K 4.3  --  3.3*  CL 89*  --  96*  CO2 20*  --  32  GLUCOSE 84  --  96  BUN 32*  --  20  CREATININE 6.95* 6.63* 4.39*  CALCIUM 8.5*  --  8.2*  MG  --  1.9  --   AST 60*  --   --   ALT 13*  --   --   ALKPHOS 75  --   --   BILITOT 2.2*  --   --    ------------------------------------------------------------------------------------------------------------------  Cardiac Enzymes  Recent Labs Lab 05/01/15 1715 05/02/15 0012  TROPONINI 3.52* 3.20*   ------------------------------------------------------------------------------------------------------------------  RADIOLOGY:  Dg Chest Port 1 View  05/01/2015   CLINICAL DATA:  Sepsis.  EXAM: PORTABLE CHEST - 1 VIEW  COMPARISON:  12/30/2014.  12/25/2014.  FINDINGS: Mediastinum and hilar structures are normal. Chronic interstitial prominence consistent with chronic interstitial lung disease. Bibasilar subsegmental atelectasis and/or infiltrates. Pleural parenchymal thickening again  noted consistent with scarring. Cardiomegaly with normal pulmonary vascularity. No pleural effusion or pneumothorax.  IMPRESSION: 1. Chronic interstitial  lung disease. Pleural parenchymal scarring . 2. Mild bibasilar subsegmental atelectasis and/or infiltrates. 3. Stable cardiomegaly.   Electronically Signed   By: Marcello Moores  Register   On: 05/01/2015 08:30     ASSESSMENT AND PLAN:   * Sepsis - likely due to pneumonia with a lactic acid of 10  - Patient is currently hemodynamically stable. -now off BIPAP. Transition to [place] and prn BIPAP  * HCAP (healthcare-associated pneumonia) - IVVanco and Zosyn - f/u Sputum culture,blood and urine cultures.   * Acute respiratory failure with hypoxemia  - Patient states that she is a DO NOT RESUSCITATE, as of this time she is able to tolerate noninvasive ventilation if necessary. -transition to [place]  * Elevated troponin - in the setting of end-stage renal disease on dialysis, however her troponin is significantly elevated at greater than 2. -treat underlying cause per Cardiology. Appears demend ischemia  *ESRD (end stage renal disease) on dialysis  -underwent urgent dialysis with 2liter UF. - Appreciate nephrology's assistance with this patient.  * COPD (chronic obstructive pulmonary disease) - not on home inhalers at baseline. -cont duo nebs   *Diastolic congestive heart failure - history of the same, with no recent echo found in the system on chart review. We'll order echocardiogram a she is here and patient, and follow cardiology's recommendations. -cont UF  * HTN (hypertension) - this is a chronic problem -will currently hold antihypertensives.  *Hypothyroidism - chronic stable  -continue patient's home dose of thyroid replacement.      All the records are reviewed and case discussed with Care Management/Social Workerr. Management plans discussed with the patient, family and they are in agreement.  CODE STATUS: Limited  TOTAL CRITICAL TIME TAKING CARE OF THIS PATIENT: 40 minutes.   POSSIBLE D/C IN 2-3DAYS, DEPENDING ON CLINICAL CONDITION.   Lourie Retz M.D on 05/02/2015 at 12:02  PM  Between 7am to 6pm - Pager - [phone]  After 6pm go to [email or web address] - password [email or web address] [facility]  Office  [phone]  CC: Primary care physician; Madelyn Brunner, MD

## 2015-05-02 NOTE — Progress Notes (Signed)
Blood sugar remains low despite po intake; unable to give IV D50  due to poor IV access. Pt remains alert, oriented, appropriate and without complaints. Lab draw done to verify blood sugar. Used cpap all night, sat 9 6-100% with it on. Off to eat sandwich and drink juice, 02 by [place] at 6L,  sat dropped to 77%, with heart rates correlating and good wave form, appears to be accurate reading. Still remains at baseline LOC and mentation with no inc in resp distress; dyspnea on mild exertion. Generalized posterior wheezing with some crackles, coarse anteriorally. Troponin returned 3.2; MD aware of troponin yesterday of 2.81. Remains A Fib/SR with PACs, rate 50s-60s, BP stable.

## 2015-05-02 NOTE — Progress Notes (Signed)
[facility]  ROUNDING NOTE   Subjective:   Hemodialysis yesterday. Ultrafiltration of 2 litres. Now off bipap and on [place] 5 litres.  Daughter at bedside.  Patient sitting up and eating breakfast.   Objective:  Vital signs in last 24 hours:  Temp:  [96.7 F (35.9 C)-97.7 F (36.5 C)] 97.5 F (36.4 C) (05/05 2030) Pulse Rate:  [25-84] 70 (05/06 0700) Resp:  [14-29] 15 (05/06 0700) BP: (87-139)/(30-89) 110/62 mmHg (05/06 0700) SpO2:  [75 %-100 %] 96 % (05/06 0700) FiO2 (%):  [0.7 %-80 %] 5 % (05/05 2200) Weight:  [78.4 kg (172 lb 13.5 oz)-78.7 kg (173 lb 8 oz)] 78.4 kg (172 lb 13.5 oz) (05/06 0340)  Weight change:  Filed Weights   05/01/15 1650 05/01/15 2200 05/02/15 0340  Weight: 78.7 kg (173 lb 8 oz) 78.4 kg (172 lb 13.5 oz) 78.4 kg (172 lb 13.5 oz)    Intake/Output: I/O last 3 completed shifts: In: 1910 [P.O.:360; IV Piggyback:1550] Out: 2002 [Urine:2; Other:2000]   Intake/Output this shift:     Physical Exam: General: NAD, nonlabored breathing  Head: Normocephalic, atraumatic.  Eyes: Anicteric, PERRL  Neck: Supple, trachea midline  Lungs:  clear  Heart: Regular rate and rhythm  Abdomen:  Soft, nontender,   Extremities: no peripheral edema.  Neurologic: Nonfocal, moving all four extremities  Skin: No lesions  Access: Left arm AVF    Basic Metabolic Panel:  Recent Labs Lab 05/01/15 0755 05/01/15 1232 05/02/15 0630  NA 138  --  140  K 4.3  --  3.3*  CL 89*  --  96*  CO2 20*  --  32  GLUCOSE 84  --  96  BUN 32*  --  20  CREATININE 6.95* 6.63* 4.39*  CALCIUM 8.5*  --  8.2*  MG  --  1.9  --   PHOS  --  7.0*  --     Liver Function Tests:  Recent Labs Lab 05/01/15 0755  AST 60*  ALT 13*  ALKPHOS 75  BILITOT 2.2*  PROT 7.6  ALBUMIN 3.6    Recent Labs Lab 05/01/15 1232  LIPASE 38   No results for input(s): AMMONIA in the last 168 hours.  CBC:  Recent Labs Lab 05/01/15 0755 05/01/15 1232 05/02/15 0630  WBC 8.1 6.1 3.4*   NEUTROABS 6.9*  --   --   HGB 10.6* 9.7* 9.5*  HCT 34.0* 31.0* 29.7*  MCV 92.7 90.9 89.7  PLT 154 105* 119*    Cardiac Enzymes:  Recent Labs Lab 05/01/15 0755 05/01/15 1232 05/01/15 1715 05/02/15 0012  TROPONINI 2.35* 2.81* 3.52* 3.20*    BNP: Invalid input(s): POCBNP  CBG:  Recent Labs Lab 05/01/15 1159 05/01/15 1613 05/01/15 2117 05/01/15 2243 05/02/15 0718  GLUCAP 106* 96 53* 61* 86    Microbiology: Results for orders placed or performed during the hospital encounter of 05/01/15  Blood Culture (routine x 2)     Status: None (Preliminary result)   Collection Time: 05/01/15  7:55 AM  Result Value Ref Range Status   Specimen Description BLOOD  Final   Special Requests Normal  Final   Culture NO GROWTH 1 DAY  Final   Report Status PENDING  Incomplete  Blood Culture (routine x 2)     Status: None (Preliminary result)   Collection Time: 05/01/15  7:57 AM  Result Value Ref Range Status   Specimen Description BLOOD  Final   Special Requests Normal  Final   Culture NO GROWTH  1 DAY  Final   Report Status PENDING  Incomplete  Culture, sputum-assessment     Status: None (Preliminary result)   Collection Time: 05/01/15  9:00 AM  Result Value Ref Range Status   Specimen Description SPUTUM  Final   Special Requests Normal  Final   Sputum evaluation   Final    Sputum specimen not acceptable for testing.  Please recollect.   Results Called to: [facility] AT 6834 05/01/15 LAB    Report Status PENDING  Incomplete    Coagulation Studies:  Recent Labs  05/01/15 0755  LABPROT 19.8*  INR 1.66    Urinalysis:  Recent Labs  05/01/15 0900  COLORURINE PINK*  LABSPEC 1.015  PHURINE 0.0*  GLUCOSEU TEST NOT REPORTED DUE TO COLOR INTERFERENCE OF URINE PIGMENT*  HGBUR TEST NOT REPORTED DUE TO COLOR INTERFERENCE OF URINE PIGMENT*  BILIRUBINUR TEST NOT REPORTED DUE TO COLOR INTERFERENCE OF URINE PIGMENT*  KETONESUR TEST NOT REPORTED DUE TO COLOR INTERFERENCE OF URINE  PIGMENT*  PROTEINUR TEST NOT REPORTED DUE TO COLOR INTERFERENCE OF URINE PIGMENT*  NITRITE TEST NOT REPORTED DUE TO COLOR INTERFERENCE OF URINE PIGMENT*  LEUKOCYTESUR TEST NOT REPORTED DUE TO COLOR INTERFERENCE OF URINE PIGMENT*      Imaging: Dg Chest Port 1 View  05/01/2015   CLINICAL DATA:  Sepsis.  EXAM: PORTABLE CHEST - 1 VIEW  COMPARISON:  12/30/2014.  12/25/2014.  FINDINGS: Mediastinum and hilar structures are normal. Chronic interstitial prominence consistent with chronic interstitial lung disease. Bibasilar subsegmental atelectasis and/or infiltrates. Pleural parenchymal thickening again noted consistent with scarring. Cardiomegaly with normal pulmonary vascularity. No pleural effusion or pneumothorax.  IMPRESSION: 1. Chronic interstitial lung disease. Pleural parenchymal scarring . 2. Mild bibasilar subsegmental atelectasis and/or infiltrates. 3. Stable cardiomegaly.   Electronically Signed   By: Marcello Moores  Register   On: 05/01/2015 08:30     Medications:     . antiseptic oral rinse  7 mL Mouth Rinse q12n4p  . b complex-vitamin c-folic acid  1 tablet Oral QHS  . calcium acetate  667 mg Oral TID WC  . chlorhexidine  15 mL Mouth Rinse BID  . cinacalcet  30 mg Oral QHS  . heparin  5,000 Units Subcutaneous 3 times per day  . levothyroxine  125 mcg Oral QAC breakfast  . piperacillin-tazobactam (ZOSYN)  IV  3.375 g Intravenous Q12H  . sevelamer carbonate  2,400 mg Oral TID WC   sodium chloride, sodium chloride, acetaminophen **OR** acetaminophen, ipratropium-albuterol, ondansetron **OR** ondansetron (ZOFRAN) IV, senna-docusate, tetrahydrozoline  Assessment/ Plan:   [name] is a 74 y.o. black female with end-stage renal disease on HD since 2002, hypertension, hypothyroidism, emphysema, AOCD, secondary hyperparathyroidism, colonic perforation s/p repair 12/05/12, colon cancer status post colectomy   Admitted for hypoglycemia and pulmonary edema on 05/01/2015  [facility] [place] TTS first shift  1. ESRD on HD TTHS: noncardiogenic edema has resolved with HD. Potassium of 3.3. Next treatment for tomorrow.  - evaluated daily for dialysis need.   2. Anemia of CKD: hemoglobin 9.5. micera as outpatient.  - epo with inpatient Hemodialysis treatment.   3. SHPTH: continue cinacalcet and calcium acetate - Check phos and PTH  4. Pneumonia J18.9: concern for sepsis with increased lactic acid level. Metabolic acidosis.  - empiric antibiotics with zosyn and vancomycin. Renally dosed. Pharmacy following.     LOS: 1 [name] 5/6/20169:19 AM

## 2015-05-02 NOTE — Progress Notes (Signed)
SUBJECTIVE:  No chest pain. Still with hypoglycemia.    Filed Vitals:   05/02/15 0000 05/02/15 0100 05/02/15 0340 05/02/15 0700  BP: 112/62 87/41  110/62  Pulse: 70 56  70  Temp:      TempSrc:      Resp: 19 15  15   Height:      Weight:   172 lb 13.5 oz (78.4 kg)   SpO2: 99% 100%  96%    Intake/Output Summary (Last 24 hours) at 05/02/15 0847 Last data filed at 05/01/15 2100  Gross per 24 hour  Intake   1910 ml  Output   2002 ml  Net    -92 ml    LABS: Basic Metabolic Panel:  Recent Labs  05/01/15 0755 05/01/15 1232  NA 138  --   K 4.3  --   CL 89*  --   CO2 20*  --   GLUCOSE 84  --   BUN 32*  --   CREATININE 6.95* 6.63*  CALCIUM 8.5*  --   MG  --  1.9  PHOS  --  7.0*   Liver Function Tests:  Recent Labs  05/01/15 0755  AST 60*  ALT 13*  ALKPHOS 75  BILITOT 2.2*  PROT 7.6  ALBUMIN 3.6    Recent Labs  05/01/15 1232  LIPASE 38   CBC:  Recent Labs  05/01/15 0755 05/01/15 1232 05/02/15 0630  WBC 8.1 6.1 3.4*  NEUTROABS 6.9*  --   --   HGB 10.6* 9.7* 9.5*  HCT 34.0* 31.0* 29.7*  MCV 92.7 90.9 89.7  PLT 154 105* 119*   Cardiac Enzymes:  Recent Labs  05/01/15 1232 05/01/15 1715 05/02/15 0012  TROPONINI 2.81* 3.52* 3.20*   BNP: Invalid input(s): POCBNP D-Dimer: No results for input(s): DDIMER in the last 72 hours. Hemoglobin A1C: No results for input(s): HGBA1C in the last 72 hours. Fasting Lipid Panel: No results for input(s): CHOL, HDL, LDLCALC, TRIG, CHOLHDL, LDLDIRECT in the last 72 hours. Thyroid Function Tests: No results for input(s): TSH, T4TOTAL, T3FREE, THYROIDAB in the last 72 hours.  Invalid input(s): FREET3 Anemia Panel: No results for input(s): VITAMINB12, FOLATE, FERRITIN, TIBC, IRON, RETICCTPCT in the last 72 hours.   PHYSICAL EXAM General: Well developed, well nourished, in no acute distress HEENT:  Normocephalic and atramatic Neck:  No JVD.  Lungs: Clear bilaterally to auscultation and  percussion. Heart: HRRR . Normal S1 and S2 without gallops or murmurs.  Abdomen: Bowel sounds are positive, abdomen soft and non-tender  Msk:  Back normal, normal gait. Normal strength and tone for age. Extremities: No clubbing, cyanosis or edema.   Neuro: Alert and oriented X 3. Psych:  Good affect, responds appropriately  TELEMETRY: Reviewed telemetry pt in  normal sinus rhythm with PACs:   ASSESSMENT AND PLAN: 1. Elevated troponin: -First troponin of 2.35,  repeat was 3.5. This could be due to supply demand ischemia. However, the patient has multiple risk factors for coronary artery disease. -Once she is stable would evaluate further with either a pharmacologic nuclear stress test or cardiac cath -Hypotension precludes starting of beta blocker  2. Respiratory failure/PNA/sepsis: -Requiring BiPAP currently -On Zosyn and vancomycin -She reports feeling much better curenttly than when she presented  -Blood cultures pending -Per IM  3. Arrhythmia: -Tele shows NSR with frequent PACs, blocked PACs, and junctional escape beats -Start beta blocker when able  4. ESRD on HD: -Missed treatment today -Nephrology on board  5. Anemia of CKD: -Hgb  9.7, epo as inpatient     Kathlyn Sacramento, MD, [facility] 05/02/2015 8:47 AM

## 2015-05-02 NOTE — Progress Notes (Signed)
RN notified Dr Fritzi Mandes that patient has been doing well on 6L [place], sats comes down in 86 sometimes but mostly goes back up to above 90%, currently pulse ox at 93%.  MD states "you can make her stepdown for tonight, we can maybe move her out to the floor tomorrow"

## 2015-05-02 NOTE — Progress Notes (Signed)
Patient alert and oriented, vital signs stable, alternate between afib and sinus rhythm on cardiac monitor, afebrile.  No complaint of pain this shift.  Patient off bipap, now on 6L [place] puls ox in the low 90s.   Order for stepdown.  Patient currently resting in no apparent distress.  See SCM for further details.

## 2015-05-02 NOTE — Progress Notes (Signed)
RN notified Dr Boyce Medici that patient states that she takes a pill for her bleeding ulcers at home and would like to request to take it here.  Home meds list include 40mg  protonix.  MD gave order for PO 40mg  protonix daily.

## 2015-05-03 ENCOUNTER — Inpatient Hospital Stay: Payer: Medicare Other

## 2015-05-03 DIAGNOSIS — A419 Sepsis, unspecified organism: Principal | ICD-10-CM

## 2015-05-03 DIAGNOSIS — Z992 Dependence on renal dialysis: Secondary | ICD-10-CM

## 2015-05-03 DIAGNOSIS — N186 End stage renal disease: Secondary | ICD-10-CM

## 2015-05-03 DIAGNOSIS — J189 Pneumonia, unspecified organism: Secondary | ICD-10-CM

## 2015-05-03 DIAGNOSIS — I5033 Acute on chronic diastolic (congestive) heart failure: Secondary | ICD-10-CM

## 2015-05-03 LAB — LACTIC ACID, PLASMA
LACTIC ACID, VENOUS: 1.1 mmol/L (ref 0.5–2.0)
Lactic Acid, Venous: 1 mmol/L (ref 0.5–2.0)
Lactic Acid, Venous: 1.2 mmol/L (ref 0.5–2.0)

## 2015-05-03 LAB — RENAL FUNCTION PANEL
ANION GAP: 13 (ref 5–15)
Albumin: 2.9 g/dL — ABNORMAL LOW (ref 3.5–5.0)
BUN: 26 mg/dL — AB (ref 6–20)
CO2: 29 mmol/L (ref 22–32)
Calcium: 8 mg/dL — ABNORMAL LOW (ref 8.9–10.3)
Chloride: 91 mmol/L — ABNORMAL LOW (ref 101–111)
Creatinine, Ser: 6.08 mg/dL — ABNORMAL HIGH (ref 0.44–1.00)
GFR calc non Af Amer: 6 mL/min — ABNORMAL LOW (ref >60)
GFR, EST AFRICAN AMERICAN: 7 mL/min — AB (ref >60)
GLUCOSE: 111 mg/dL — AB (ref 65–99)
PHOSPHORUS: 3.2 mg/dL (ref 2.5–4.6)
POTASSIUM: 3.2 mmol/L — AB (ref 3.5–5.1)
Sodium: 133 mmol/L — ABNORMAL LOW (ref 135–145)

## 2015-05-03 LAB — GLUCOSE, CAPILLARY
GLUCOSE-CAPILLARY: 81 mg/dL (ref 70–99)
GLUCOSE-CAPILLARY: 95 mg/dL (ref 70–99)
Glucose-Capillary: 92 mg/dL (ref 70–99)
Glucose-Capillary: 85 mg/dL (ref 70–99)
Glucose-Capillary: 96 mg/dL (ref 70–99)
Glucose-Capillary: 91 mg/dL (ref 70–99)

## 2015-05-03 LAB — CBC
HCT: 28.7 % — ABNORMAL LOW (ref 35.0–47.0)
HEMOGLOBIN: 9 g/dL — AB (ref 12.0–16.0)
MCH: 28.3 pg (ref 26.0–34.0)
MCHC: 31.5 g/dL — ABNORMAL LOW (ref 32.0–36.0)
MCV: 89.9 fL (ref 80.0–100.0)
PLATELETS: 140 10*3/uL — AB (ref 150–440)
RBC: 3.19 MIL/uL — AB (ref 3.80–5.20)
RDW: 19.6 % — ABNORMAL HIGH (ref 11.5–14.5)
WBC: 4.2 10*3/uL (ref 3.6–11.0)

## 2015-05-03 LAB — EXPECTORATED SPUTUM ASSESSMENT W REFEX TO RESP CULTURE

## 2015-05-03 LAB — EXPECTORATED SPUTUM ASSESSMENT W GRAM STAIN, RFLX TO RESP C: Special Requests: NORMAL

## 2015-05-03 MED ORDER — METOPROLOL TARTRATE 25 MG PO TABS: 12.5000 mg | ORAL_TABLET | Freq: Two times a day (BID) | ORAL | Status: DC

## 2015-05-03 MED ORDER — IPRATROPIUM-ALBUTEROL 0.5-2.5 (3) MG/3ML IN SOLN
3.0000 mL | RESPIRATORY_TRACT | Status: DC | PRN
  Administered 2015-05-06: 3 mL via RESPIRATORY_TRACT
  Filled 2015-05-03: qty 3

## 2015-05-03 MED ORDER — SODIUM CHLORIDE 0.9 % IV SOLN: 100.0000 mL | INTRAVENOUS | Status: DC | PRN

## 2015-05-03 MED ORDER — EPOETIN ALFA 10000 UNIT/ML IJ SOLN
10000.0000 [IU] | Freq: Once | INTRAMUSCULAR | Status: AC
  Administered 2015-05-03: 15:00:00 10000 [IU] via INTRAVENOUS

## 2015-05-03 MED ORDER — IPRATROPIUM-ALBUTEROL 0.5-2.5 (3) MG/3ML IN SOLN
3.0000 mL | Freq: Four times a day (QID) | RESPIRATORY_TRACT | Status: DC
  Administered 2015-05-04: 3 mL via RESPIRATORY_TRACT
  Filled 2015-05-03: qty 3

## 2015-05-03 MED ORDER — IPRATROPIUM-ALBUTEROL 0.5-2.5 (3) MG/3ML IN SOLN
3.0000 mL | RESPIRATORY_TRACT | Status: DC
  Administered 2015-05-03: 3 mL via RESPIRATORY_TRACT
  Filled 2015-05-03 (×2): qty 3

## 2015-05-03 NOTE — Progress Notes (Signed)
Pt is alert and oriented x 4, transfer from ccu, received patient from hemodialysis, on 4 l oxygen, resting in room quietly, 1.5 L removed during dialysis.Received patient at 18:30

## 2015-05-03 NOTE — Progress Notes (Signed)
[facility] [place] NAME: [name]    MR#:  [id number]  DATE OF BIRTH:  1941/04/09  SUBJECTIVE:  Off Bipap now on 3 L [place] doing well.  Shortness of breath improved.  No  Other complaints.   REVIEW OF SYSTEMS:   Review of Systems  Constitutional: Negative for fever and chills.  HENT: Negative for congestion and nosebleeds.   Respiratory: Positive for cough and wheezing. Negative for shortness of breath.   Cardiovascular: Positive for orthopnea and PND. Negative for chest pain.  Gastrointestinal: Negative for nausea, vomiting, abdominal pain and diarrhea.  Genitourinary: Negative for dysuria and hematuria.  Neurological: Negative for headaches.   Nutrition: po diet Tolerating PT: Not Done yet.  Tolerating diet: yes  DRUG ALLERGIES:   Allergies  Allergen Reactions  . Shellfish-Derived Products Anaphylaxis    VITALS:  Blood pressure 107/50, pulse 63, temperature 97.9 F (36.6 C), temperature source Oral, resp. rate 21, height 5\' 4"  (1.626 m), weight 80.4 kg (177 lb 4 oz), SpO2 94 %.  PHYSICAL EXAMINATION:  GENERAL:  74 y.o.-year-old patient lying in the bed with no acute distress.  EYES: Pupils equal, round, reactive to light and accommodation. No scleral icterus. Extraocular muscles intact.  HEENT: Head atraumatic, normocephalic. Oropharynx and nasopharynx clear.  NECK:  Supple, + jugular venous distention. No thyroid enlargement, no tenderness.  LUNGS: + use of accessory muscles.  Diffuse wheezing b/l.  No rales, rhonchi.  Good A/E b/l.  CARDIOVASCULAR: S1, S2 normal. No murmurs, rubs, or gallops or clicks.  ABDOMEN: Soft, nontender, nondistended. Bowel sounds present. No organomegaly or mass.  EXTREMITIES: + 1-2 pedal edema b/l, no cyanosis, or clubbing.  NEUROLOGIC: Cranial nerves II through XII are intact. No focal motor sensory defecits b/l. Globally weak PSYCHIATRIC: The patient is alert and oriented x 3.  SKIN:  No obvious rash, lesion, or ulcer.    LABORATORY PANEL:   CBC  Recent Labs Lab 05/01/15 1232 05/02/15 0630  WBC 6.1 3.4*  HGB 9.7* 9.5*  HCT 31.0* 29.7*  PLT 105* 119*   ------------------------------------------------------------------------------------------------------------------  Chemistries   Recent Labs Lab 05/01/15 0755 05/01/15 1232 05/02/15 0630  NA 138  --  140  K 4.3  --  3.3*  CL 89*  --  96*  CO2 20*  --  32  GLUCOSE 84  --  96  BUN 32*  --  20  CREATININE 6.95* 6.63* 4.39*  CALCIUM 8.5*  --  8.2*  MG  --  1.9  --   AST 60*  --   --   ALT 13*  --   --   ALKPHOS 75  --   --   BILITOT 2.2*  --   --    ------------------------------------------------------------------------------------------------------------------  Cardiac Enzymes  Recent Labs Lab 05/01/15 1715 05/02/15 0012  TROPONINI 3.52* 3.20*   ------------------------------------------------------------------------------------------------------------------  RADIOLOGY:  Dg Chest Port 1 View  05/03/2015   CLINICAL DATA:  74 year old female with a history of interstitial lung disease. Increasing cough  EXAM: PORTABLE CHEST - 1 VIEW  COMPARISON:  05/01/2015, 12/30/2014 chest CT 12/05/2012  FINDINGS: Cardiomediastinal silhouette unchanged. Calcifications of the aortic arch and descending aorta.  Low lung volumes. Mixed interstitial and airspace disease of the right lung periphery.  No pneumothorax or pleural effusion.  No displaced fracture.  Vascular calcifications of the upper extremities.  IMPRESSION: Mixed interstitial and airspace disease of the right greater than left lung. This is most concerning for  infection given the patient's history. Background of interstitial lung disease/ scarring may also be present, though there is no recent CT comparison, and the current appearance would represent significant progression.  Atherosclerosis.  Signed,  Dulcy Fanny. Earleen Newport, DO  [facility]  [facility]   Electronically Signed   By: Corrie Mckusick D.O.   On: 05/03/2015 11:42     ASSESSMENT AND PLAN:   * Sepsis - likely due to pneumonia and likely HCAP.   - clinically improved and afebrile, hemodynamically stable.  - cont. IV Vanco, Zosyn.   -now off BIPAP. Transition to [place] and prn BIPAP  * HCAP (healthcare-associated pneumonia) - likely cause of sepsis - cont.  IV Vanco and Zosyn - sputum CX pending, BC (-) so far.    * Acute respiratory failure with hypoxemia - likely due to combination of mild CHF and pneumonia.  - cont. Treatment of pneumonia as mentioned above. Going to have HD today to have fluid removed.  - was on Bipap and now weaned to [facility] and will monitor. PRN bipap.   * Elevated troponin - in the setting of end-stage renal disease on dialysis and also demand ischemia - treat underlying cause per Cardiology.  - once stable from infectious/hypoxia standpoint can do outpatient Cath, stress test - Echo showing EF of 55 with moderately decreased RV systolic function.   *ESRD (end stage renal disease) on dialysis  - dialysis on Tu, Thur, Sat.  Nephro following and to have Hd today.   * COPD (chronic obstructive pulmonary disease)  -cont duo nebs.  Assess for Home O2 prior to discharge.   * HTN (hypertension) - b.p on the low side and holding bp meds.   *Hypothyroidism - cont. Synthroid.   * Secondary Hyperparathyroidism - cont. Sensipar.   Transfer to floor, Pt consult.      All the records are reviewed and case discussed with Care Management/Social Workerr. Management plans discussed with the patient, family and they are in agreement.  CODE STATUS: Limited  TOTAL CRITICAL TIME TAKING CARE OF THIS PATIENT: 40 minutes.   POSSIBLE D/C IN 2-3DAYS, DEPENDING ON CLINICAL CONDITION.   Henreitta Leber M.D on 05/03/2015 at 12:01 PM  Between 7am to 6pm - Pager - [phone]  After 6pm go to [email or web address] - password [email or web address]  [facility]  Office  [phone]  CC: Primary care physician; Madelyn Brunner, MD

## 2015-05-03 NOTE — Progress Notes (Signed)
Patient ID: [name], female   DOB: [birth year], 74 y.o.   MRN: [id number]   SUBJECTIVE:  No chest pain.  Still wheezing and coughing up clear sputum.  Reports dyspnea.    Scheduled Meds: . antiseptic oral rinse  7 mL Mouth Rinse q12n4p  . b complex-vitamin c-folic acid  1 tablet Oral QHS  . calcium acetate  667 mg Oral TID WC  . chlorhexidine  15 mL Mouth Rinse BID  . cinacalcet  30 mg Oral QHS  . feeding supplement (NEPRO CARB STEADY)  237 mL Oral Q24H  . heparin  5,000 Units Subcutaneous 3 times per day  . levothyroxine  125 mcg Oral QAC breakfast  . metoprolol tartrate  12.5 mg Oral BID  . pantoprazole  40 mg Oral QAC breakfast  . piperacillin-tazobactam (ZOSYN)  IV  3.375 g Intravenous Q12H  . sevelamer carbonate  2,400 mg Oral TID WC  . vancomycin  1,000 mg Intravenous Q T,Th,Sa-HD   Continuous Infusions:  PRN Meds:.sodium chloride, sodium chloride, acetaminophen **OR** acetaminophen, ipratropium-albuterol, ondansetron **OR** ondansetron (ZOFRAN) IV, senna-docusate, tetrahydrozoline   Filed Vitals:   05/03/15 0900 05/03/15 1000 05/03/15 1100 05/03/15 1110  BP: 99/65 120/54 120/36 107/50  Pulse: 72 66 63   Temp:      TempSrc:      Resp: 21 20 21    Height:      Weight:      SpO2: 95% 94% 94%     Intake/Output Summary (Last 24 hours) at 05/03/15 1135 Last data filed at 05/03/15 1101  Gross per 24 hour  Intake    890 ml  Output      2 ml  Net    888 ml    LABS: Basic Metabolic Panel:  Recent Labs  05/01/15 0755 05/01/15 1232 05/02/15 0630  NA 138  --  140  K 4.3  --  3.3*  CL 89*  --  96*  CO2 20*  --  32  GLUCOSE 84  --  96  BUN 32*  --  20  CREATININE 6.95* 6.63* 4.39*  CALCIUM 8.5*  --  8.2*  MG  --  1.9  --   PHOS  --  7.0*  --    Liver Function Tests:  Recent Labs  05/01/15 0755  AST 60*  ALT 13*  ALKPHOS 75  BILITOT 2.2*  PROT 7.6  ALBUMIN 3.6    Recent Labs  05/01/15 1232  LIPASE 38   CBC:  Recent Labs  05/01/15 0755  05/01/15 1232 05/02/15 0630  WBC 8.1 6.1 3.4*  NEUTROABS 6.9*  --   --   HGB 10.6* 9.7* 9.5*  HCT 34.0* 31.0* 29.7*  MCV 92.7 90.9 89.7  PLT 154 105* 119*   Cardiac Enzymes:  Recent Labs  05/01/15 1232 05/01/15 1715 05/02/15 0012  TROPONINI 2.81* 3.52* 3.20*   BNP: Invalid input(s): POCBNP D-Dimer: No results for input(s): DDIMER in the last 72 hours. Hemoglobin A1C: No results for input(s): HGBA1C in the last 72 hours. Fasting Lipid Panel: No results for input(s): CHOL, HDL, LDLCALC, TRIG, CHOLHDL, LDLDIRECT in the last 72 hours. Thyroid Function Tests: No results for input(s): TSH, T4TOTAL, T3FREE, THYROIDAB in the last 72 hours.  Invalid input(s): FREET3 Anemia Panel: No results for input(s): VITAMINB12, FOLATE, FERRITIN, TIBC, IRON, RETICCTPCT in the last 72 hours.   PHYSICAL EXAM General: Well developed, well nourished, in no acute distress HEENT:  Normocephalic and atramatic Neck:  JVP 10-12 cm.  Lungs:  Distant breath sounds with end expiratory wheezing.  Heart: HRRR . Normal S1 and S2 without gallops or murmurs.  Abdomen: Bowel sounds are positive, abdomen soft and non-tender  Msk:  Back normal, normal gait. Normal strength and tone for age. Extremities: No clubbing, cyanosis or edema.   Neuro: Alert and oriented X 3. Psych:  Good affect, responds appropriately  TELEMETRY: Reviewed telemetry pt in normal sinus rhythm with PACs, had run of ?atrial fibrillation with aberrancy overnight   ASSESSMENT AND PLAN: 1. Elevated troponin: First troponin of 2.35,  repeat was 3.5. This could be due to demand ischemia with presumed HCAP and hypoxia. However, the patient has multiple risk factors for coronary artery disease.  Echo with EF 50-55%, moderate RV dilation/moderately decreased RV systolic function.  - Once she is stable would evaluate further with either a pharmacologic nuclear stress test or cardiac cath - RV dysfunction raises concern for PE but lung exam more  suggestive of PNA and volume overload.   2. Respiratory failure/PNA/sepsis: Today, she is short of breath with wheezing.  On exam she is volume overloaded.   - On Zosyn and vancomycin, continue for suspected HCAP. - Blood cultures NGTD - I think that a lot of her dyspnea this morning is likely due to volume overload (JVD) => needs HD today for fluid removal. Renal to see.  3. Arrhythmia: PACs, possible runs of atrial fibrillation with aberrancy (short).  - Hold off on beta blocker initiation today with active wheezing.  4. ESRD on HD:  As above, I think she should have fluid off today.  Due for HD.  5. Anemia of CKD: -Hgb 9.7, epo as inpatient   Loralie Champagne, MD, [facility] 05/03/2015 11:35 AM

## 2015-05-03 NOTE — Progress Notes (Signed)
Patient is alert and oriented. Reporting no pain. On 4l nasal cannula since start of shift, oxygen saturation WNL. Tolerating diet. Aneuric. Plan for hemodialysis this afternoon. Transfer to 1C, report called to [place]. Will transfer shortly.

## 2015-05-03 NOTE — Progress Notes (Signed)
[facility]  ROUNDING NOTE   Subjective:   Increased productive cough. Some desaturations. Scheduled for hemodialysis later today.    Objective:  Vital signs in last 24 hours:  Temp:  [96.8 F (36 C)-98.6 F (37 C)] 97.9 F (36.6 C) (05/07 0800) Pulse Rate:  [55-85] 63 (05/07 1100) Resp:  [14-26] 21 (05/07 1100) BP: (92-120)/(26-81) 107/50 mmHg (05/07 1110) SpO2:  [89 %-100 %] 94 % (05/07 1100) Weight:  [80.4 kg (177 lb 4 oz)] 80.4 kg (177 lb 4 oz) (05/07 0650)  Weight change: 8.732 kg (19 lb 4 oz) Filed Weights   05/01/15 2200 05/02/15 0340 05/03/15 0650  Weight: 78.4 kg (172 lb 13.5 oz) 78.4 kg (172 lb 13.5 oz) 80.4 kg (177 lb 4 oz)    Intake/Output: I/O last 3 completed shifts: In: 1350 [P.O.:1200; IV Piggyback:150] Out: 2000 [Other:2000]   Intake/Output this shift:  Total I/O In: 240 [P.O.:240] Out: 2 [Stool:2]  Physical Exam: General: NAD, nonlabored breathing  Head: Normocephalic, atraumatic.  Eyes: Anicteric, PERRL  Neck: Supple, trachea midline  Lungs:  Bilateral wheezes  Heart: Regular rate and rhythm  Abdomen:  Soft, nontender,   Extremities: no peripheral edema.  Neurologic: Nonfocal, moving all four extremities  Skin: No lesions  Access: Left arm AVF    Basic Metabolic Panel:  Recent Labs Lab 05/01/15 0755 05/01/15 1232 05/02/15 0630  NA 138  --  140  K 4.3  --  3.3*  CL 89*  --  96*  CO2 20*  --  32  GLUCOSE 84  --  96  BUN 32*  --  20  CREATININE 6.95* 6.63* 4.39*  CALCIUM 8.5*  --  8.2*  MG  --  1.9  --   PHOS  --  7.0*  --     Liver Function Tests:  Recent Labs Lab 05/01/15 0755  AST 60*  ALT 13*  ALKPHOS 75  BILITOT 2.2*  PROT 7.6  ALBUMIN 3.6    Recent Labs Lab 05/01/15 1232  LIPASE 38   No results for input(s): AMMONIA in the last 168 hours.  CBC:  Recent Labs Lab 05/01/15 0755 05/01/15 1232 05/02/15 0630  WBC 8.1 6.1 3.4*  NEUTROABS 6.9*  --   --   HGB 10.6* 9.7* 9.5*  HCT 34.0* 31.0*  29.7*  MCV 92.7 90.9 89.7  PLT 154 105* 119*    Cardiac Enzymes:  Recent Labs Lab 05/01/15 0755 05/01/15 1232 05/01/15 1715 05/02/15 0012  TROPONINI 2.35* 2.81* 3.52* 3.20*    BNP: Invalid input(s): POCBNP  CBG:  Recent Labs Lab 05/02/15 1636 05/03/15 0117 05/03/15 0352 05/03/15 0800 05/03/15 1125  GLUCAP 92 92 81 85 96    Microbiology: Results for orders placed or performed during the hospital encounter of 05/01/15  Blood Culture (routine x 2)     Status: None (Preliminary result)   Collection Time: 05/01/15  7:55 AM  Result Value Ref Range Status   Specimen Description BLOOD  Final   Special Requests Normal  Final   Culture NO GROWTH 2 DAYS  Final   Report Status PENDING  Incomplete  Blood Culture (routine x 2)     Status: None (Preliminary result)   Collection Time: 05/01/15  7:57 AM  Result Value Ref Range Status   Specimen Description BLOOD  Final   Special Requests Normal  Final   Culture NO GROWTH 2 DAYS  Final   Report Status PENDING  Incomplete  Urine culture  Status: None (Preliminary result)   Collection Time: 05/01/15  9:00 AM  Result Value Ref Range Status   Specimen Description URINE, RANDOM  Final   Special Requests Normal  Final   Culture   Final    80,000 COLONIES/ml GRAM NEGATIVE RODS 50,000 COLONIES/mL GRAM NEGATIVE RODS IDENTIFICATION TO FOLLOW SUSCEPTIBILITIES TO FOLLOW TWO DIFFERENT COLONY MORPHOLOGIES    Report Status PENDING  Incomplete  Culture, sputum-assessment     Status: None (Preliminary result)   Collection Time: 05/01/15  9:00 AM  Result Value Ref Range Status   Specimen Description SPUTUM  Final   Special Requests Normal  Final   Sputum evaluation   Final    Sputum specimen not acceptable for testing.  Please recollect.   Results Called to: [facility] AT 2482 05/01/15 LAB    Report Status PENDING  Incomplete    Coagulation Studies:  Recent Labs  05/01/15 0755  LABPROT 19.8*  INR 1.66     Urinalysis:  Recent Labs  05/01/15 0900  COLORURINE PINK*  LABSPEC 1.015  PHURINE 0.0*  GLUCOSEU TEST NOT REPORTED DUE TO COLOR INTERFERENCE OF URINE PIGMENT*  HGBUR TEST NOT REPORTED DUE TO COLOR INTERFERENCE OF URINE PIGMENT*  BILIRUBINUR TEST NOT REPORTED DUE TO COLOR INTERFERENCE OF URINE PIGMENT*  KETONESUR TEST NOT REPORTED DUE TO COLOR INTERFERENCE OF URINE PIGMENT*  PROTEINUR TEST NOT REPORTED DUE TO COLOR INTERFERENCE OF URINE PIGMENT*  NITRITE TEST NOT REPORTED DUE TO COLOR INTERFERENCE OF URINE PIGMENT*  LEUKOCYTESUR TEST NOT REPORTED DUE TO COLOR INTERFERENCE OF URINE PIGMENT*      Imaging: No results found.   Medications:     . antiseptic oral rinse  7 mL Mouth Rinse q12n4p  . b complex-vitamin c-folic acid  1 tablet Oral QHS  . calcium acetate  667 mg Oral TID WC  . chlorhexidine  15 mL Mouth Rinse BID  . cinacalcet  30 mg Oral QHS  . feeding supplement (NEPRO CARB STEADY)  237 mL Oral Q24H  . heparin  5,000 Units Subcutaneous 3 times per day  . levothyroxine  125 mcg Oral QAC breakfast  . pantoprazole  40 mg Oral QAC breakfast  . piperacillin-tazobactam (ZOSYN)  IV  3.375 g Intravenous Q12H  . sevelamer carbonate  2,400 mg Oral TID WC  . vancomycin  1,000 mg Intravenous Q T,Th,Sa-HD   sodium chloride, sodium chloride, acetaminophen **OR** acetaminophen, ipratropium-albuterol, ondansetron **OR** ondansetron (ZOFRAN) IV, senna-docusate, tetrahydrozoline  Assessment/ Plan:   Ms. Litzenberger is a 74 y.o. black female with end-stage renal disease on HD since 2002, hypertension, hypothyroidism, emphysema, AOCD, secondary hyperparathyroidism, colonic perforation s/p repair 12/05/12, colon cancer status post colectomy   Admitted for hypoglycemia and pulmonary edema on 05/01/2015  [facility] [place] [facility] first shift  1. ESRD on HD TTHS: with noncardiogenic edema  - Hemdialysis scheduled for later today. UF goal of 2 litres.  - CXR pending to evaluate  pulmonary edema and for pneumonia.  - evaluated daily for dialysis need.   2. Anemia of CKD: hemoglobin 9.5. micera as outpatient.  - epo with inpatient Hemodialysis treatment.   3. SHPTH: continue cinacalcet and calcium acetate - Check phos and PTH  4. Pneumonia J18.9: concern for sepsis with increased lactic acid level. Metabolic acidosis.  - empiric antibiotics with zosyn and vancomycin. Renally dosed. Pharmacy following.     LOS: 2 Chiante Peden 5/7/201611:38 AM

## 2015-05-03 NOTE — Progress Notes (Signed)
Notify Dr. Iva Boop that's pts sodium serum was 140 on 5/6 and is 133 today, MD acknowledged, no new orders.

## 2015-05-03 NOTE — Progress Notes (Signed)
Pt was resting comfortably throughout night, noted that pt is mouth breathing while asleep and oxygen saturation drops to low 80's on monitor due to low saturation pt placed back on bipap and saturation increased to 100%. Pt with no complaints of pain or sob. No other changes throughout shift.

## 2015-05-03 NOTE — Progress Notes (Signed)
Pre-hd tx 

## 2015-05-04 DIAGNOSIS — J9601 Acute respiratory failure with hypoxia: Secondary | ICD-10-CM

## 2015-05-04 LAB — BASIC METABOLIC PANEL
Anion gap: 12 (ref 5–15)
BUN: 14 mg/dL (ref 6–20)
CO2: 31 mmol/L (ref 22–32)
Calcium: 8.4 mg/dL — ABNORMAL LOW (ref 8.9–10.3)
Chloride: 95 mmol/L — ABNORMAL LOW (ref 101–111)
Creatinine, Ser: 4.15 mg/dL — ABNORMAL HIGH (ref 0.44–1.00)
GFR calc Af Amer: 11 mL/min — ABNORMAL LOW (ref >60)
GFR calc non Af Amer: 10 mL/min — ABNORMAL LOW (ref >60)
Glucose, Bld: 102 mg/dL — ABNORMAL HIGH (ref 65–99)
Potassium: 3.2 mmol/L — ABNORMAL LOW (ref 3.5–5.1)
Sodium: 138 mmol/L (ref 135–145)

## 2015-05-04 LAB — URINE CULTURE
Culture: 80000
Special Requests: NORMAL

## 2015-05-04 LAB — GLUCOSE, CAPILLARY
GLUCOSE-CAPILLARY: 115 mg/dL — AB (ref 70–99)
GLUCOSE-CAPILLARY: 94 mg/dL (ref 70–99)
GLUCOSE-CAPILLARY: 96 mg/dL (ref 70–99)
GLUCOSE-CAPILLARY: 98 mg/dL (ref 70–99)
Glucose-Capillary: 88 mg/dL (ref 70–99)
Glucose-Capillary: 99 mg/dL (ref 70–99)

## 2015-05-04 LAB — LACTIC ACID, PLASMA
LACTIC ACID, VENOUS: 1.3 mmol/L (ref 0.5–2.0)
LACTIC ACID, VENOUS: 1.4 mmol/L (ref 0.5–2.0)
Lactic Acid, Venous: 1.9 mmol/L (ref 0.5–2.0)
Lactic Acid, Venous: 2.5 mmol/L (ref 0.5–2.0)

## 2015-05-04 MED ORDER — IPRATROPIUM-ALBUTEROL 0.5-2.5 (3) MG/3ML IN SOLN
3.0000 mL | Freq: Three times a day (TID) | RESPIRATORY_TRACT | Status: DC
  Administered 2015-05-04 – 2015-05-06 (×6): 3 mL via RESPIRATORY_TRACT
  Filled 2015-05-04 (×6): qty 3

## 2015-05-04 MED ORDER — LEVOFLOXACIN IN D5W 500 MG/100ML IV SOLN
500.0000 mg | INTRAVENOUS | Status: DC
  Administered 2015-05-04: 500 mg via INTRAVENOUS
  Filled 2015-05-04: qty 100

## 2015-05-04 NOTE — Progress Notes (Signed)
[facility]  ROUNDING NOTE   Subjective:   Moved to CCU. Hemodialysis yesterday. Tolerating treatment well. UF of 1 litre.   Objective:  Vital signs in last 24 hours:  Temp:  [97.9 F (36.6 C)-98.4 F (36.9 C)] 98.4 F (36.9 C) (05/08 0528) Pulse Rate:  [64-70] 70 (05/08 0528) Resp:  [14-22] 16 (05/08 0528) BP: (96-119)/(47-67) 110/53 mmHg (05/08 0528) SpO2:  [85 %-98 %] 94 % (05/08 0528) Weight:  [77.9 kg (171 lb 11.8 oz)-79.861 kg (176 lb 1 oz)] 79.861 kg (176 lb 1 oz) (05/08 0500)  Weight change: -2.5 kg (-5 lb 8.2 oz) Filed Weights   05/03/15 0650 05/03/15 1430 05/04/15 0500  Weight: 80.4 kg (177 lb 4 oz) 77.9 kg (171 lb 11.8 oz) 79.861 kg (176 lb 1 oz)    Intake/Output: I/O last 3 completed shifts: In: 340 [P.O.:240; IV Piggyback:100] Out: 1004 [Other:1000; Stool:4]   Intake/Output this shift:  Total I/O In: 240 [P.O.:240] Out: 1 [Stool:1]  Physical Exam: General: NAD, nonlabored breathing  Head: Normocephalic, atraumatic.  Eyes: Anicteric, PERRL  Neck: Supple, trachea midline  Lungs:  clear  Heart: Regular rate and rhythm  Abdomen:  Soft, nontender,   Extremities: no peripheral edema.  Neurologic: Nonfocal, moving all four extremities  Skin: No lesions  Access: Left arm AVF    Basic Metabolic Panel:  Recent Labs Lab 05/01/15 0755 05/01/15 1232 05/02/15 0630 05/03/15 1430 05/04/15 0152  NA 138  --  140 133* 138  K 4.3  --  3.3* 3.2* 3.2*  CL 89*  --  96* 91* 95*  CO2 20*  --  32 29 31  GLUCOSE 84  --  96 111* 102*  BUN 32*  --  20 26* 14  CREATININE 6.95* 6.63* 4.39* 6.08* 4.15*  CALCIUM 8.5*  --  8.2* 8.0* 8.4*  MG  --  1.9  --   --   --   PHOS  --  7.0*  --  3.2  --     Liver Function Tests:  Recent Labs Lab 05/01/15 0755 05/03/15 1430  AST 60*  --   ALT 13*  --   ALKPHOS 75  --   BILITOT 2.2*  --   PROT 7.6  --   ALBUMIN 3.6 2.9*    Recent Labs Lab 05/01/15 1232  LIPASE 38   No results for input(s): AMMONIA  in the last 168 hours.  CBC:  Recent Labs Lab 05/01/15 0755 05/01/15 1232 05/02/15 0630 05/03/15 1430  WBC 8.1 6.1 3.4* 4.2  NEUTROABS 6.9*  --   --   --   HGB 10.6* 9.7* 9.5* 9.0*  HCT 34.0* 31.0* 29.7* 28.7*  MCV 92.7 90.9 89.7 89.9  PLT 154 105* 119* 140*    Cardiac Enzymes:  Recent Labs Lab 05/01/15 0755 05/01/15 1232 05/01/15 1715 05/02/15 0012  TROPONINI 2.35* 2.81* 3.52* 3.20*    BNP: Invalid input(s): POCBNP  CBG:  Recent Labs Lab 05/03/15 2034 05/04/15 0040 05/04/15 0430 05/04/15 0724 05/04/15 1131  GLUCAP 95 94 96 88 99    Microbiology: Results for orders placed or performed during the hospital encounter of 05/01/15  Blood Culture (routine x 2)     Status: None (Preliminary result)   Collection Time: 05/01/15  7:55 AM  Result Value Ref Range Status   Specimen Description BLOOD  Final   Special Requests Normal  Final   Culture NO GROWTH 3 DAYS  Final   Report Status PENDING  Incomplete  Blood Culture (routine x 2)     Status: None (Preliminary result)   Collection Time: 05/01/15  7:57 AM  Result Value Ref Range Status   Specimen Description BLOOD  Final   Special Requests Normal  Final   Culture NO GROWTH 3 DAYS  Final   Report Status PENDING  Incomplete  Urine culture     Status: None (Preliminary result)   Collection Time: 05/01/15  9:00 AM  Result Value Ref Range Status   Specimen Description URINE, RANDOM  Final   Special Requests Normal  Final   Culture   Final    80,000 COLONIES/ml GRAM NEGATIVE RODS 50,000 COLONIES/mL GRAM NEGATIVE RODS IDENTIFICATION TO FOLLOW SUSCEPTIBILITIES TO FOLLOW TWO DIFFERENT COLONY MORPHOLOGIES    Report Status PENDING  Incomplete  Culture, sputum-assessment     Status: None   Collection Time: 05/01/15  9:00 AM  Result Value Ref Range Status   Specimen Description SPUTUM  Final   Special Requests Normal  Final   Sputum evaluation   Final    Sputum specimen not acceptable for testing.  Please  recollect.   Results Called to: Loletha Grayer AT 1322 05/01/15 LAB    Report Status 05/03/2015 FINAL  Final    Coagulation Studies: No results for input(s): LABPROT, INR in the last 72 hours.  Urinalysis: No results for input(s): COLORURINE, LABSPEC, PHURINE, GLUCOSEU, HGBUR, BILIRUBINUR, KETONESUR, PROTEINUR, UROBILINOGEN, NITRITE, LEUKOCYTESUR in the last 72 hours.  Invalid input(s): APPERANCEUR    Imaging: Dg Chest Port 1 View  05/03/2015   CLINICAL DATA:  74 year old female with a history of interstitial lung disease. Increasing cough  EXAM: PORTABLE CHEST - 1 VIEW  COMPARISON:  05/01/2015, 12/30/2014 chest CT 12/05/2012  FINDINGS: Cardiomediastinal silhouette unchanged. Calcifications of the aortic arch and descending aorta.  Low lung volumes. Mixed interstitial and airspace disease of the right lung periphery.  No pneumothorax or pleural effusion.  No displaced fracture.  Vascular calcifications of the upper extremities.  IMPRESSION: Mixed interstitial and airspace disease of the right greater than left lung. This is most concerning for infection given the patient's history. Background of interstitial lung disease/ scarring may also be present, though there is no recent CT comparison, and the current appearance would represent significant progression.  Atherosclerosis.  Signed,  Dulcy Fanny. Earleen Newport, DO  [facility]  [facility]   Electronically Signed   By: Corrie Mckusick D.O.   On: 05/03/2015 11:42     Medications:     . b complex-vitamin c-folic acid  1 tablet Oral QHS  . calcium acetate  667 mg Oral TID WC  . cinacalcet  30 mg Oral QHS  . feeding supplement (NEPRO CARB STEADY)  237 mL Oral Q24H  . heparin  5,000 Units Subcutaneous 3 times per day  . ipratropium-albuterol  3 mL Nebulization TID  . levothyroxine  125 mcg Oral QAC breakfast  . pantoprazole  40 mg Oral QAC breakfast  . piperacillin-tazobactam (ZOSYN)  IV  3.375 g Intravenous  Q12H  . sevelamer carbonate  2,400 mg Oral TID WC  . vancomycin  1,000 mg Intravenous Q T,Th,Sa-HD   sodium chloride, sodium chloride, sodium chloride, sodium chloride, acetaminophen **OR** acetaminophen, ipratropium-albuterol, ondansetron **OR** ondansetron (ZOFRAN) IV, senna-docusate, tetrahydrozoline  Assessment/ Plan:   Ms. Swantek is a 74 y.o. black female with end-stage renal disease on HD since 2002, hypertension, hypothyroidism, emphysema, AOCD, secondary hyperparathyroidism, colonic perforation s/p repair 12/05/12, colon cancer status post colectomy   Admitted  for hypoglycemia and pulmonary edema on 05/01/2015  [facility] first shift  1. ESRD on HD TTHS: with noncardiogenic edema  - Hemdialysis scheduled for Tuesday if still here.  - evaluated daily for dialysis need.   2. Anemia of CKD: hemoglobin 9. micera as outpatient.  - epo with inpatient Hemodialysis treatment.   3. SHPTH: continue cinacalcet and calcium acetate - pending phos and PTH  4. Pneumonia J18.9: concern for sepsis with increased lactic acid level. Metabolic acidosis.  - empiric antibiotics with zosyn and vancomycin. Renally dosed. Pharmacy following.     LOS: 3 Birgitta Uhlir 5/8/20162:09 PM

## 2015-05-04 NOTE — Plan of Care (Signed)
Problem: Discharge Progression Outcomes Goal: Other Discharge Outcomes/Goals Outcome: Progressing No c/o pain. Nausea meds x1 for nausea following lunch. Ambulated with PT and up to chair for most of shift  Problem: Discharge Progression Outcomes Goal: Other Discharge Outcomes/Goals Outcome: Progressing Continues to have dyspnea with exertion, sats dropping with exertion. Tolerating sitting up in chair

## 2015-05-04 NOTE — Evaluation (Signed)
Physical Therapy Evaluation Patient Details Name: [name] MRN: [id number] DOB: Mar 01, 1941 Today's Date: 05/04/2015   History of Present Illness  [name] is a 74 y.o. female who presents with hypoglycemic episode at home. Patient states that she woke up and was confused, talking out of her head. Her daughter was with her ready to take her to dialysis. Patient states that she was having significant sweats last night. Her daughter was concerned when she was weak and confused, and called EMS. EMS found her fingerstick blood sugar value to be, reportedly, 15. They brought her to the ED right away, where her fingerstick was found to still be low at 45 despite glucose treatment she was given by EMS. The patient states here that she has had several days of productive sputum, yellow in color. She also states that she's been having profuse sweating at different times throughout the day, and worst at night. She states that she has had some increased shortness of breath. On evaluation in the ED she was found to be hypoxic requiring O2 via nasal cannula, and then nonrebreather. Apart from the hypoglycemia, she was also found to have a troponin elevated at 2.35, and a lactic acid very elevated at 10. Hospitals were called for admission for sepsis. Chest x-ray shows likely pneumonia. Pt was admitted for sepsis secondary to HCAP as well as acute respiratory failure. Cardiology recommending that pt follow-up for cardiac cath or stress test when medically stable. At baseline pt is a limited community ambulatory without assistive device although she occasionally uses a single point cane. She has had 1 fall in the last 12 months. Family describes a fairly sedentary lifestyle. She has received HH PT from [facility] in the recent past but daughter reports poor compliance after therapy ended.    Clinical Impression  Pt demonstrates good effort during therapy and is appropriate to discharge home with trial of HHPT  for balance, strengthening, and cardiopulmonary endurance. However according to the daughter patient has not been compliant with exercises since her last bout of physical therapy earlier this year so it is unclear if there will be much carryover. Reinforced that pt needs to wear supplemental O2 at all times.   Follow Up Recommendations Home health PT    Equipment Recommendations  None recommended by PT    Recommendations for Other Services       Precautions / Restrictions Precautions Precautions: None Restrictions Weight Bearing Restrictions: No      Mobility  Bed Mobility Overal bed mobility:  (received upright in recliner)                Transfers Overall transfer level: Independent Equipment used: None             General transfer comment: Good weight shift and speed demonstrated  Ambulation/Gait Ambulation/Gait assistance: Min guard Ambulation Distance (Feet): 250 Feet Assistive device: 1 person hand held assist (mostly none) Gait Pattern/deviations: WFL(Within Functional Limits)     General Gait Details: Generally good speed and cadence although slightly below full community ambulation. Pt prefers to reach for chair rails on wall when available. Moderate lateral gait deviations with head turns. Pt becomes fatigued during ambulation. SaO2 drops to 89% on 4 L/min O2 and pt requires standing rest break with pursed lip breathing in order to rebound to >90%.  Stairs Stairs: Yes Stairs assistance: Min guard Stair Management: One rail Right Number of Stairs: 4 General stair comments: Step-to pattern, good safety demonstrated  Wheelchair Mobility  Modified Rankin (Stroke Patients Only)       Balance Overall balance assessment: Independent         Standing balance support: Single extremity supported   Standing balance comment: Able to maintain narrow and wide stance without loss of balance. Positive Rhomberg Single Leg Stance - Right Leg: 1 Single  Leg Stance - Left Leg: 1                         Pertinent Vitals/Pain Pain Assessment: No/denies pain    Home Living Family/patient expects to be discharged to:: Private residence Living Arrangements: Spouse/significant other;Children Available Help at Discharge: Family Type of Home: House Home Access: Stairs to enter Entrance Stairs-Rails: Psychiatric nurse of Steps: 7 Home Layout: One level Home Equipment: Environmental consultant - 2 wheels;Cane - single point;Shower seat (wi shower)      Prior Function Level of Independence: Independent               Hand Dominance        Extremity/Trunk Assessment   Upper Extremity Assessment: Overall WFL for tasks assessed (Grossly 4 to 4+/5)           Lower Extremity Assessment: Overall WFL for tasks assessed (Grossly 4 to 4+/5)         Communication   Communication: No difficulties  Cognition Arousal/Alertness: Awake/alert Behavior During Therapy: WFL for tasks assessed/performed Overall Cognitive Status: Within Functional Limits for tasks assessed                      General Comments      Exercises        Assessment/Plan    PT Assessment Patient needs continued PT services  PT Diagnosis Abnormality of gait   PT Problem List Decreased balance;Cardiopulmonary status limiting activity  PT Treatment Interventions Gait training;Therapeutic exercise;Balance training;Neuromuscular re-education;Patient/family education   PT Goals (Current goals can be found in the Care Plan section) Acute Rehab PT Goals Patient Stated Goal: "I would like to return home" PT Goal Formulation: With patient Time For Goal Achievement: 05/18/15 Potential to Achieve Goals: Good    Frequency Min 2X/week   Barriers to discharge        Co-evaluation               End of Session Equipment Utilized During Treatment: Gait belt Activity Tolerance: Patient tolerated treatment well Patient left: in  chair;with chair alarm set           Time: 0913-0929 PT Time Calculation (min) (ACUTE ONLY): 16 min   Charges:   PT Evaluation $Initial PT Evaluation Tier I: 1 Procedure     PT G Codes:       Garron Eline D Jkayla Spiewak, PT  Rakiya Krawczyk 05/04/2015, 11:00 AM

## 2015-05-04 NOTE — Progress Notes (Signed)
Patient ID: [name], female   DOB: November 14, 1941, 74 y.o.   MRN: [id number]   SUBJECTIVE:  No chest pain.  Still some dyspnea but better after HD yesterday.     Scheduled Meds: . b complex-vitamin c-folic acid  1 tablet Oral QHS  . calcium acetate  667 mg Oral TID WC  . cinacalcet  30 mg Oral QHS  . feeding supplement (NEPRO CARB STEADY)  237 mL Oral Q24H  . heparin  5,000 Units Subcutaneous 3 times per day  . ipratropium-albuterol  3 mL Nebulization TID  . levothyroxine  125 mcg Oral QAC breakfast  . pantoprazole  40 mg Oral QAC breakfast  . piperacillin-tazobactam (ZOSYN)  IV  3.375 g Intravenous Q12H  . sevelamer carbonate  2,400 mg Oral TID WC  . vancomycin  1,000 mg Intravenous Q T,Th,Sa-HD   Continuous Infusions:  PRN Meds:.sodium chloride, sodium chloride, sodium chloride, sodium chloride, acetaminophen **OR** acetaminophen, ipratropium-albuterol, ondansetron **OR** ondansetron (ZOFRAN) IV, senna-docusate, tetrahydrozoline   Filed Vitals:   05/03/15 2117 05/03/15 2148 05/04/15 0500 05/04/15 0528  BP:  96/67  110/53  Pulse:  69  70  Temp:  97.9 F (36.6 C)  98.4 F (36.9 C)  TempSrc:  Oral    Resp:  16  16  Height:      Weight:   176 lb 1 oz (79.861 kg)   SpO2: 93% 98%  94%    Intake/Output Summary (Last 24 hours) at 05/04/15 1124 Last data filed at 05/04/15 0900  Gross per 24 hour  Intake    290 ml  Output   1003 ml  Net   -713 ml    LABS: Basic Metabolic Panel:  Recent Labs  05/01/15 1232  05/03/15 1430 05/04/15 0152  NA  --   < > 133* 138  K  --   < > 3.2* 3.2*  CL  --   < > 91* 95*  CO2  --   < > 29 31  GLUCOSE  --   < > 111* 102*  BUN  --   < > 26* 14  CREATININE 6.63*  < > 6.08* 4.15*  CALCIUM  --   < > 8.0* 8.4*  MG 1.9  --   --   --   PHOS 7.0*  --  3.2  --   < > = values in this interval not displayed. Liver Function Tests:  Recent Labs  05/03/15 1430  ALBUMIN 2.9*    Recent Labs  05/01/15 1232  LIPASE 38   CBC:  Recent  Labs  05/02/15 0630 05/03/15 1430  WBC 3.4* 4.2  HGB 9.5* 9.0*  HCT 29.7* 28.7*  MCV 89.7 89.9  PLT 119* 140*   Cardiac Enzymes:  Recent Labs  05/01/15 1232 05/01/15 1715 05/02/15 0012  TROPONINI 2.81* 3.52* 3.20*   BNP: Invalid input(s): POCBNP D-Dimer: No results for input(s): DDIMER in the last 72 hours. Hemoglobin A1C: No results for input(s): HGBA1C in the last 72 hours. Fasting Lipid Panel: No results for input(s): CHOL, HDL, LDLCALC, TRIG, CHOLHDL, LDLDIRECT in the last 72 hours. Thyroid Function Tests: No results for input(s): TSH, T4TOTAL, T3FREE, THYROIDAB in the last 72 hours.  Invalid input(s): FREET3 Anemia Panel: No results for input(s): VITAMINB12, FOLATE, FERRITIN, TIBC, IRON, RETICCTPCT in the last 72 hours.   PHYSICAL EXAM General: Well developed, well nourished, in no acute distress HEENT:  Normocephalic and atramatic Neck:  JVP 12+ cm.  Lungs: Crackles bilaterally Heart: HRRR .  Normal S1 and S2 without gallops or murmurs.  Abdomen: Bowel sounds are positive, abdomen soft and non-tender  Msk:  Back normal, normal gait. Normal strength and tone for age. Extremities: No clubbing, cyanosis or edema.   Neuro: Alert and oriented X 3. Psych:  Good affect, responds appropriately  TELEMETRY: Not on telemetry   ASSESSMENT AND PLAN: 1. Elevated troponin: First troponin of 2.35,  repeat was 3.5. This could be due to demand ischemia with presumed HCAP and hypoxia. However, the patient has multiple risk factors for coronary artery disease.  Echo with EF 50-55%, moderate RV dilation/moderately decreased RV systolic function.  - Once she is stable would evaluate further with either a pharmacologic nuclear stress test or cardiac cath - RV dysfunction raises concern for PE but lung exam more suggestive of PNA and volume overload.   2. Respiratory failure/PNA/sepsis: Today, breathing better but still a lot of crackles on lung exam. She remains volume overloaded.    - On Zosyn and vancomycin, continue for suspected HCAP. - Blood cultures NGTD - Still with excess fluid => dialysis per renal but still needs more fluid off. 3. Arrhythmia: PACs, possible runs of atrial fibrillation with aberrancy (short).  - Hold off on beta blocker initiation today with wheezing/acute CHF.   4. ESRD on HD:  As above, needs more fluid off.  5. Anemia of CKD  Loralie Champagne, MD, [facility] 05/04/2015 11:24 AM

## 2015-05-04 NOTE — Progress Notes (Signed)
[facility] [place] NAME: [name]    MR#:  [id number]  DATE OF BIRTH:  [birth year]  SUBJECTIVE:   Sitting up in chair in no distress.  Shortness of breath improved.  Off bipap and on 3 L [place] doing well.   REVIEW OF SYSTEMS:   Review of Systems  Constitutional: Negative for fever and chills.  HENT: Negative for congestion and tinnitus.   Eyes: Negative for blurred vision and double vision.  Respiratory: Positive for cough (non-productive) and shortness of breath (much improved).   Cardiovascular: Negative for chest pain, orthopnea and PND.  Gastrointestinal: Negative for nausea, vomiting, abdominal pain and diarrhea.  Genitourinary: Negative for dysuria and hematuria.  Neurological: Negative for sensory change and focal weakness.  All other systems reviewed and are negative.  Nutrition: Renal diet Tolerating PT:  Yes Tolerating diet: yes  DRUG ALLERGIES:   Allergies  Allergen Reactions  . Shellfish-Derived Products Anaphylaxis    VITALS:  Blood pressure 110/53, pulse 70, temperature 98.4 F (36.9 C), temperature source Oral, resp. rate 16, height 5\' 4"  (1.626 m), weight 79.861 kg (176 lb 1 oz), SpO2 94 %.  PHYSICAL EXAMINATION:  GENERAL:  74 y.o.-year-old patient lying in the bed with no acute distress.  EYES: Pupils equal, round, reactive to light and accommodation. No scleral icterus. Extraocular muscles intact.  HEENT: Head atraumatic, normocephalic. Oropharynx and nasopharynx clear.  NECK:  Supple, no JVD, No thyroid enlargement, no tenderness.  LUNGS: (-) use of accessory muscles, No wheezing, No rales, rhonchi.  Good A/E b/l.  CARDIOVASCULAR: S1, S2 normal. II/VI SEM at RSB, No rubs, or gallops or clicks.  ABDOMEN: Soft, nontender, nondistended. Bowel sounds present. No organomegaly or mass.  EXTREMITIES: + 1-2 pedal edema b/l, no cyanosis, or clubbing.  NEUROLOGIC: Cranial nerves II through XII are intact. No  focal motor sensory defecits b/l. Globally weak PSYCHIATRIC: The patient is alert and oriented x 3.  SKIN: No obvious rash, lesion, or ulcer.    LABORATORY PANEL:   CBC  Recent Labs Lab 05/02/15 0630 05/03/15 1430  WBC 3.4* 4.2  HGB 9.5* 9.0*  HCT 29.7* 28.7*  PLT 119* 140*   ------------------------------------------------------------------------------------------------------------------  Chemistries   Recent Labs Lab 05/01/15 0755 05/01/15 1232  05/03/15 1430 05/04/15 0152  NA 138  --   < > 133* 138  K 4.3  --   < > 3.2* 3.2*  CL 89*  --   < > 91* 95*  CO2 20*  --   < > 29 31  GLUCOSE 84  --   < > 111* 102*  BUN 32*  --   < > 26* 14  CREATININE 6.95* 6.63*  < > 6.08* 4.15*  CALCIUM 8.5*  --   < > 8.0* 8.4*  MG  --  1.9  --   --   --   AST 60*  --   --   --   --   ALT 13*  --   --   --   --   ALKPHOS 75  --   --   --   --   BILITOT 2.2*  --   --   --   --   < > = values in this interval not displayed. ------------------------------------------------------------------------------------------------------------------  Cardiac Enzymes  Recent Labs Lab 05/01/15 1715 05/02/15 0012  TROPONINI 3.52* 3.20*   ------------------------------------------------------------------------------------------------------------------  RADIOLOGY:  Dg Chest Port 1 View  05/03/2015   CLINICAL  DATA:  74 year old female with a history of interstitial lung disease. Increasing cough  EXAM: PORTABLE CHEST - 1 VIEW  COMPARISON:  05/01/2015, 12/30/2014 chest CT 12/05/2012  FINDINGS: Cardiomediastinal silhouette unchanged. Calcifications of the aortic arch and descending aorta.  Low lung volumes. Mixed interstitial and airspace disease of the right lung periphery.  No pneumothorax or pleural effusion.  No displaced fracture.  Vascular calcifications of the upper extremities.  IMPRESSION: Mixed interstitial and airspace disease of the right greater than left lung. This is most concerning for  infection given the patient's history. Background of interstitial lung disease/ scarring may also be present, though there is no recent CT comparison, and the current appearance would represent significant progression.  Atherosclerosis.  Signed,  Dulcy Fanny. Earleen Newport, DO  [facility]  [facility]   Electronically Signed   By: Corrie Mckusick D.O.   On: 05/03/2015 11:42     ASSESSMENT AND PLAN:   * Sepsis - likely due to pneumonia and likely HCAP.   - clinically improved and afebrile, hemodynamically stable.  - on IV Vanco, Zosyn and switched to IV Levaquin today.  -now off BIPAP on [place] doing well.   * HCAP (healthcare-associated pneumonia) - likely cause of sepsis - on IV Vanco, Zosyn and weaned to IV Levaquin today.  - sputum, BC (-) so far.   * Acute respiratory failure with hypoxemia - likely due to combination of mild CHF and pneumonia.  - cont. Treatment of pneumonia as mentioned above. S/p HD yesterday and feels better.   - was on Bipap and now weaned to [facility] and will monitor. PRN bipap.   * Elevated troponin - in the setting of end-stage renal disease on dialysis and also demand ischemia - treat underlying cause per Cardiology.  - once stable from infectious/hypoxia standpoint can do outpatient Cath, stress test - Echo showing EF of 55% with moderately decreased RV systolic function.   *ESRD (end stage renal disease) on dialysis  - dialysis on Tu, Thur, Sat.  Nephro following and HD per them.    * COPD (chronic obstructive pulmonary disease)  -cont duo nebs.  Assess for Home O2 prior to discharge.   * HTN (hypertension) - b.p on the low side and holding bp meds.   * Hypothyroidism - cont. Synthroid.   * Secondary Hyperparathyroidism - cont. Sensipar.    Seen by PT and they recommend home w/ home health.     All the records are reviewed and case discussed with Care Management/Social Workerr. Management plans discussed with the  patient, family and they are in agreement.  CODE STATUS: Limited  TOTAL CRITICAL TIME TAKING CARE OF THIS PATIENT: 40 minutes.   POSSIBLE D/C IN 1-2 DAYS, DEPENDING ON CLINICAL CONDITION.   Henreitta Leber M.D on 05/04/2015 at 3:06 PM  Between 7am to 6pm - Pager - [phone]  After 6pm go to [email or web address] - password [email or web address] [facility]  Office  [phone]  CC: Primary care physician; Madelyn Brunner, MD

## 2015-05-04 NOTE — Progress Notes (Signed)
Dr. Lavetta Nielsen notified of critical lactic acid of 2.5. Acknowledge, no new order at this time.

## 2015-05-04 NOTE — Plan of Care (Signed)
Problem: Discharge Progression Outcomes Goal: Barriers To Progression Addressed/Resolved Patient goes by [name] and is from home. HD patient, dialysis schedule is T/Th/Sat.  PMH: HTN, Hyperlipidemia, Hypothyroidism, CKD, Autoimmune Thrombocytopenia, COPD, Diverticulitis, Aginal pain, Pneumonia, GERD, Diastolic CHF, and arthritis; currently controlled with home medications.       Goal: Other Discharge Outcomes/Goals Outcome: Progressing Plan of care progress to goals: -Denies pain this shift. -denies SOB this shift -HD patient, not scheduled for dialysis today, normal schedule is T/Th/Sat.  -IV antibiotics infused per order.  -Tolerating diet and snacks this shift.  - FSBS stable in the 90's this shift.

## 2015-05-05 LAB — CBC
HEMATOCRIT: 27.8 % — AB (ref 35.0–47.0)
HEMOGLOBIN: 8.8 g/dL — AB (ref 12.0–16.0)
MCH: 28.8 pg (ref 26.0–34.0)
MCHC: 31.8 g/dL — AB (ref 32.0–36.0)
MCV: 90.6 fL (ref 80.0–100.0)
Platelets: 127 10*3/uL — ABNORMAL LOW (ref 150–440)
RBC: 3.06 MIL/uL — AB (ref 3.80–5.20)
RDW: 19.7 % — ABNORMAL HIGH (ref 11.5–14.5)
WBC: 3.3 10*3/uL — ABNORMAL LOW (ref 3.6–11.0)

## 2015-05-05 LAB — BASIC METABOLIC PANEL
Anion gap: 13 (ref 5–15)
BUN: 25 mg/dL — ABNORMAL HIGH (ref 6–20)
CO2: 31 mmol/L (ref 22–32)
Calcium: 8.2 mg/dL — ABNORMAL LOW (ref 8.9–10.3)
Chloride: 92 mmol/L — ABNORMAL LOW (ref 101–111)
Creatinine, Ser: 6.16 mg/dL — ABNORMAL HIGH (ref 0.44–1.00)
GFR calc Af Amer: 7 mL/min — ABNORMAL LOW (ref >60)
GFR calc non Af Amer: 6 mL/min — ABNORMAL LOW (ref >60)
GLUCOSE: 116 mg/dL — AB (ref 65–99)
POTASSIUM: 3.9 mmol/L (ref 3.5–5.1)
Sodium: 136 mmol/L (ref 135–145)

## 2015-05-05 LAB — GLUCOSE, CAPILLARY
Glucose-Capillary: 109 mg/dL — ABNORMAL HIGH (ref 70–99)
Glucose-Capillary: 82 mg/dL (ref 70–99)
Glucose-Capillary: 86 mg/dL (ref 70–99)
Glucose-Capillary: 88 mg/dL (ref 70–99)
Glucose-Capillary: 92 mg/dL (ref 70–99)

## 2015-05-05 LAB — LACTIC ACID, PLASMA
LACTIC ACID, VENOUS: 1 mmol/L (ref 0.5–2.0)
LACTIC ACID, VENOUS: 1.1 mmol/L (ref 0.5–2.0)

## 2015-05-05 LAB — MAGNESIUM: Magnesium: 1.9 mg/dL (ref 1.7–2.4)

## 2015-05-05 MED ORDER — MENTHOL 3 MG MT LOZG
1.0000 | LOZENGE | OROMUCOSAL | Status: DC | PRN
Start: 2015-05-05 — End: 2015-05-06
  Administered 2015-05-05 (×3): 3 mg via ORAL
  Filled 2015-05-05: qty 9

## 2015-05-05 MED ORDER — PENTAFLUOROPROP-TETRAFLUOROETH EX AERO: 1.0000 "application " | INHALATION_SPRAY | CUTANEOUS | Status: DC | PRN

## 2015-05-05 MED ORDER — LIDOCAINE HCL (PF) 1 % IJ SOLN
5.0000 mL | INTRAMUSCULAR | Status: DC | PRN
  Filled 2015-05-05: qty 5

## 2015-05-05 MED ORDER — ALTEPLASE 2 MG IJ SOLR
2.0000 mg | Freq: Once | INTRAMUSCULAR | Status: DC | PRN
  Filled 2015-05-05: qty 2

## 2015-05-05 MED ORDER — SODIUM CHLORIDE 0.9 % IV SOLN: 100.0000 mL | INTRAVENOUS | Status: DC | PRN

## 2015-05-05 MED ORDER — GUAIFENESIN-CODEINE 100-10 MG/5ML PO SOLN
10.0000 mL | ORAL | Status: DC | PRN
  Administered 2015-05-05: 10 mL via ORAL
  Filled 2015-05-05: qty 10

## 2015-05-05 MED ORDER — NEPRO/CARBSTEADY PO LIQD: 237.0000 mL | ORAL | Status: DC | PRN

## 2015-05-05 MED ORDER — LEVOFLOXACIN 500 MG PO TABS
500.0000 mg | ORAL_TABLET | ORAL | Status: DC
  Administered 2015-05-06: 500 mg via ORAL
  Filled 2015-05-05: qty 1

## 2015-05-05 MED ORDER — LIDOCAINE-PRILOCAINE 2.5-2.5 % EX CREA: 1.0000 "application " | TOPICAL_CREAM | CUTANEOUS | Status: DC | PRN

## 2015-05-05 MED ORDER — HEPARIN SODIUM (PORCINE) 1000 UNIT/ML DIALYSIS
1000.0000 [IU] | INTRAMUSCULAR | Status: DC | PRN
  Filled 2015-05-05: qty 1

## 2015-05-05 NOTE — Progress Notes (Signed)
POST HD 

## 2015-05-05 NOTE — Progress Notes (Signed)
PRE HD   

## 2015-05-05 NOTE — Plan of Care (Signed)
Problem: Discharge Progression Outcomes Goal: Other Discharge Outcomes/Goals Outcome: Progressing Remains on O2, Dyspnea with exertion HD today No pain Good appetite

## 2015-05-05 NOTE — Progress Notes (Signed)
[place] [facility] [facility] [place] NAME: [name]    MR#:  [id number]  DATE OF BIRTH:  1941/07/01  SUBJECTIVE:   Sitting up in chair in NAD.  Feels a bit dizzy when standing up.  Shortness of breath improved.     REVIEW OF SYSTEMS:   Review of Systems  Constitutional: Negative for fever and chills.  HENT: Negative for congestion and tinnitus.   Eyes: Negative for blurred vision and double vision.  Respiratory: Negative for cough and shortness of breath.   Cardiovascular: Negative for chest pain, orthopnea and PND.  Gastrointestinal: Negative for nausea, vomiting, abdominal pain and diarrhea.  Genitourinary: Negative for dysuria and hematuria.  Neurological: Negative for tremors, speech change and focal weakness.  All other systems reviewed and are negative.  Nutrition: Renal diet Tolerating PT:  Yes Tolerating diet: yes  DRUG ALLERGIES:   Allergies  Allergen Reactions  . Shellfish-Derived Products Anaphylaxis    VITALS:  Blood pressure 103/53, pulse 73, temperature 98.3 F (36.8 C), temperature source Oral, resp. rate 18, height 5\' 4"  (1.626 m), weight 77.384 kg (170 lb 9.6 oz), SpO2 98 %.  PHYSICAL EXAMINATION:  GENERAL:  74 y.o.-year-old patient lying in the bed with no acute distress.  EYES: Pupils equal, round, reactive to light and accommodation. No scleral icterus. Extraocular muscles intact.  HEENT: Head atraumatic, normocephalic. Oropharynx and nasopharynx clear.  NECK:  Supple, no JVD, No thyroid enlargement, no tenderness.  LUNGS: (-) use of accessory muscles, No wheezing.  Bibasilar rales. Good A/E b/l.  CARDIOVASCULAR: S1, S2 normal. II/VI SEM at RSB, No rubs, or gallops or clicks.  ABDOMEN: Soft, nontender, nondistended. Bowel sounds present. No organomegaly or mass.  EXTREMITIES: + 1-2 pedal edema b/l, no cyanosis, or clubbing.  NEUROLOGIC: Cranial nerves II through XII are intact. No focal motor sensory defecits  b/l. Globally weak PSYCHIATRIC: The patient is alert and oriented x 3.  SKIN: No obvious rash, lesion, or ulcer.    LABORATORY PANEL:   CBC  Recent Labs Lab 05/02/15 0630 05/03/15 1430  WBC 3.4* 4.2  HGB 9.5* 9.0*  HCT 29.7* 28.7*  PLT 119* 140*   ------------------------------------------------------------------------------------------------------------------  Chemistries   Recent Labs Lab 05/01/15 0755 05/01/15 1232  05/03/15 1430 05/04/15 0152  NA 138  --   < > 133* 138  K 4.3  --   < > 3.2* 3.2*  CL 89*  --   < > 91* 95*  CO2 20*  --   < > 29 31  GLUCOSE 84  --   < > 111* 102*  BUN 32*  --   < > 26* 14  CREATININE 6.95* 6.63*  < > 6.08* 4.15*  CALCIUM 8.5*  --   < > 8.0* 8.4*  MG  --  1.9  --   --   --   AST 60*  --   --   --   --   ALT 13*  --   --   --   --   ALKPHOS 75  --   --   --   --   BILITOT 2.2*  --   --   --   --   < > = values in this interval not displayed. ------------------------------------------------------------------------------------------------------------------  Cardiac Enzymes  Recent Labs Lab 05/01/15 1715 05/02/15 0012  TROPONINI 3.52* 3.20*   ------------------------------------------------------------------------------------------------------------------  RADIOLOGY:  No results found.   ASSESSMENT AND PLAN:   * Sepsis - likely  due to pneumonia and likely HCAP.   - clinically much improved and afebrile, hemodynamically stable since past 48 hrs. - switched from IV Vanc, Zosyn to Levaquin.  BC (-) so far.  - off Bipap and on Anna doing well.    * HCAP (healthcare-associated pneumonia) - likely cause of sepsis - off IV Vanco, Zosyn and now on IV Levaquin - sputum, BC (-) so far.   * Acute respiratory failure with hypoxemia - likely due to combination of mild CHF and pneumonia.  - cont. Treatment of pneumonia as mentioned above.  Discussed w/ Nephro and will get extra HD session today to get fluid removed.  - was on Bipap  and now weaned to [facility] and will monitor. PRN bipap.   * Elevated troponin - in the setting of end-stage renal disease on dialysis and also demand ischemia - treat underlying cause per Cardiology.  - once stable from infectious/hypoxia standpoint can do outpatient Cath, stress test - Echo showing EF of 55% with moderately decreased RV systolic function.   *ESRD (end stage renal disease) on dialysis  - dialysis on Tu, Thur, Sat.  - discussed w/ Nephro and will do extra HD session today.   * COPD (chronic obstructive pulmonary disease)  -cont duo nebs.  Already on O2 at home.   * HTN (hypertension) - b.p on the low side and holding bp meds.   * Hypothyroidism - cont. Synthroid.   * Secondary Hyperparathyroidism - cont. Sensipar.   Seen by PT and they recommend home w/ home health.     All the records are reviewed and case discussed with Care Management/Social Workerr. Management plans discussed with the patient, family and they are in agreement.  CODE STATUS: Limited  TOTAL TIME TAKING CARE OF THIS PATIENT: 30 minutes.   POSSIBLE D/C home w/ home health tomorrow after HD.    Henreitta Leber M.D on 05/05/2015 at 2:15 PM  Between 7am to 6pm - Pager - [phone]  After 6pm go to [email or web address] - password [email or web address] [place] [facility]  Office  [phone]  CC: Primary care physician; Madelyn Brunner, MD

## 2015-05-05 NOTE — Plan of Care (Signed)
Problem: Discharge Progression Outcomes Goal: Other Discharge Outcomes/Goals Plan of care progress to goals: -Denies pain this shift. -denies SOB this shift -HD patient, not scheduled for dialysis today, normal schedule is T/Th/Sat.    -Tolerating diet and snacks this shift.   - FSBS stable in the 86-98 this shift.  -pt up to [facility] with 1 assist

## 2015-05-05 NOTE — Progress Notes (Signed)
Subjective:   Reports  Excessive coughing, sputum production. No fever  Objective:  Vital signs in last 24 hours:  Temp:  [97.5 F (36.4 C)-98.4 F (36.9 C)] 98.3 F (36.8 C) (05/09 1305) Pulse Rate:  [66-73] 73 (05/09 1305) Resp:  [18] 18 (05/09 1305) BP: (103-128)/(53-63) 103/53 mmHg (05/09 1305) SpO2:  [92 %-99 %] 98 % (05/09 1349) Weight:  [77.384 kg (170 lb 9.6 oz)] 77.384 kg (170 lb 9.6 oz) (05/09 0544)  Weight change: -0.516 kg (-1 lb 2.2 oz) Filed Weights   05/03/15 1430 05/04/15 0500 05/05/15 0544  Weight: 77.9 kg (171 lb 11.8 oz) 79.861 kg (176 lb 1 oz) 77.384 kg (170 lb 9.6 oz)    Intake/Output: I/O last 3 completed shifts: In: 779 [P.O.:600; IV Piggyback:179] Out: 4 [Urine:1; Stool:3]   Intake/Output this shift:  Total I/O In: 240 [P.O.:240] Out: -   Physical Exam: General: NAD, nonlabored breathing  Head: Normocephalic, atraumatic.  Eyes: Anicteric,  Neck: Supple, trachea midline  Lungs:  Significant crackles b/l  Heart: Regular rate and rhythm  Abdomen:  Soft, nontender,   Extremities: ++ dependent peripheral edema.  Neurologic: Nonfocal, moving all four extremities  Skin: No lesions  Access: Left arm AVG    Basic Metabolic Panel:  Recent Labs Lab 05/01/15 0755 05/01/15 1232 05/02/15 0630 05/03/15 1430 05/04/15 0152  NA 138  --  140 133* 138  K 4.3  --  3.3* 3.2* 3.2*  CL 89*  --  96* 91* 95*  CO2 20*  --  32 29 31  GLUCOSE 84  --  96 111* 102*  BUN 32*  --  20 26* 14  CREATININE 6.95* 6.63* 4.39* 6.08* 4.15*  CALCIUM 8.5*  --  8.2* 8.0* 8.4*  MG  --  1.9  --   --   --   PHOS  --  7.0*  --  3.2  --     Liver Function Tests:  Recent Labs Lab 05/01/15 0755 05/03/15 1430  AST 60*  --   ALT 13*  --   ALKPHOS 75  --   BILITOT 2.2*  --   PROT 7.6  --   ALBUMIN 3.6 2.9*    Recent Labs Lab 05/01/15 1232  LIPASE 38   No results for input(s): AMMONIA in the last 168 hours.  CBC:  Recent Labs Lab 05/01/15 0755  05/01/15 1232 05/02/15 0630 05/03/15 1430  WBC 8.1 6.1 3.4* 4.2  NEUTROABS 6.9*  --   --   --   HGB 10.6* 9.7* 9.5* 9.0*  HCT 34.0* 31.0* 29.7* 28.7*  MCV 92.7 90.9 89.7 89.9  PLT 154 105* 119* 140*    Cardiac Enzymes:  Recent Labs Lab 05/01/15 0755 05/01/15 1232 05/01/15 1715 05/02/15 0012  TROPONINI 2.35* 2.81* 3.52* 3.20*    BNP: Invalid input(s): POCBNP  CBG:  Recent Labs Lab 05/04/15 1956 05/05/15 0016 05/05/15 0401 05/05/15 0726 05/05/15 1121  GLUCAP 98 28 86 73 109*    Microbiology: Results for orders placed or performed during the hospital encounter of 05/01/15  Blood Culture (routine x 2)     Status: None (Preliminary result)   Collection Time: 05/01/15  7:55 AM  Result Value Ref Range Status   Specimen Description BLOOD  Final   Special Requests Normal  Final   Culture NO GROWTH 4 DAYS  Final   Report Status PENDING  Incomplete  Blood Culture (routine x 2)     Status: None (Preliminary result)   Collection  Time: 05/01/15  7:57 AM  Result Value Ref Range Status   Specimen Description BLOOD  Final   Special Requests Normal  Final   Culture NO GROWTH 4 DAYS  Final   Report Status PENDING  Incomplete  Urine culture     Status: None   Collection Time: 05/01/15  9:00 AM  Result Value Ref Range Status   Specimen Description URINE, RANDOM  Final   Special Requests Normal  Final   Culture   Final    80,000 COLONIES/ml SERRATIA MARCESCENS 50,000 COLONIES/mL CITROBACTER FREUNDII    Report Status 05/04/2015 FINAL  Final   Organism ID, Bacteria SERRATIA MARCESCENS  Final   Organism ID, Bacteria CITROBACTER FREUNDII  Final      Susceptibility   Citrobacter freundii - MIC*    CEFTAZIDIME Value in next row       <=1SENSITIVE    CEFAZOLIN Value in next row       >=64RESISTANT    CEFTRIAXONE Value in next row Sensitive      >=64RESISTANT    CIPROFLOXACIN Value in next row Sensitive      >=64RESISTANT    GENTAMICIN Value in next row Sensitive       >=64RESISTANT    IMIPENEM Value in next row Sensitive      >=64RESISTANT    TRIMETH/SULFA Value in next row Sensitive      >=64RESISTANT    CEFOXITIN Value in next row Resistant      >=64RESISTANT    * 50,000 COLONIES/mL CITROBACTER FREUNDII   Serratia marcescens - MIC*    CEFTAZIDIME Value in next row       <=1SENSITIVE    CEFAZOLIN Value in next row       >=64RESISTANT    CEFTRIAXONE Value in next row Sensitive      >=64RESISTANT    CIPROFLOXACIN Value in next row Sensitive      >=64RESISTANT    GENTAMICIN Value in next row Sensitive      >=64RESISTANT    TRIMETH/SULFA Value in next row Sensitive      >=64RESISTANT    CEFOXITIN Value in next row Resistant      >=64RESISTANT    * 80,000 COLONIES/ml SERRATIA MARCESCENS  Culture, sputum-assessment     Status: None   Collection Time: 05/01/15  9:00 AM  Result Value Ref Range Status   Specimen Description SPUTUM  Final   Special Requests Normal  Final   Sputum evaluation   Final    Sputum specimen not acceptable for testing.  Please recollect.   Results Called to: Loletha Grayer AT 1322 05/01/15 LAB    Report Status 05/03/2015 FINAL  Final    Coagulation Studies: No results for input(s): LABPROT, INR in the last 72 hours.  Urinalysis: No results for input(s): COLORURINE, LABSPEC, PHURINE, GLUCOSEU, HGBUR, BILIRUBINUR, KETONESUR, PROTEINUR, UROBILINOGEN, NITRITE, LEUKOCYTESUR in the last 72 hours.  Invalid input(s): APPERANCEUR    Imaging: No results found.   Medications:     . b complex-vitamin c-folic acid  1 tablet Oral QHS  . calcium acetate  667 mg Oral TID WC  . cinacalcet  30 mg Oral QHS  . feeding supplement (NEPRO CARB STEADY)  237 mL Oral Q24H  . heparin  5,000 Units Subcutaneous 3 times per day  . ipratropium-albuterol  3 mL Nebulization TID  . [START ON 05/06/2015] levofloxacin  500 mg Oral Q48H  . levothyroxine  125 mcg Oral QAC breakfast  . pantoprazole  40 mg  Oral QAC breakfast  . sevelamer carbonate   2,400 mg Oral TID WC   sodium chloride, sodium chloride, sodium chloride, sodium chloride, acetaminophen **OR** acetaminophen, guaiFENesin-codeine, ipratropium-albuterol, menthol-cetylpyridinium, ondansetron **OR** ondansetron (ZOFRAN) IV, senna-docusate, tetrahydrozoline  Assessment/ Plan:   Ms. Didio is a 74 y.o. black female with end-stage renal disease on HD since 2002, hypertension, hypothyroidism, emphysema, AOCD, secondary hyperparathyroidism, colonic perforation s/p repair 12/05/12, colon cancer status post colectomy   Admitted for hypoglycemia and pulmonary edema on 05/01/2015  [facility] [place] T[facility] first shift  1. ESRD on HD TTHS: with noncardiogenic pulm edema   - Extra HD today - regular HD tomorrow  2. Anemia of CKD:  micera as outpatient.  - epo with inpatient Hemodialysis treatment.   3. SHPTH: continue cinacalcet and calcium acetate   4. Pneumonia J18.9: concern for sepsis with increased lactic acid level. Metabolic acidosis.  - empiric antibiotics with zosyn and vancomycin. Renally dosed. Pharmacy following.     LOS: 4 [name] 5/9/20162:05 PM

## 2015-05-05 NOTE — Progress Notes (Signed)
Patient: [name] / Admit Date: 05/01/2015 / Date of Encounter: 05/05/2015, 1:10 PM   Subjective: No chest pain. Continued SOB, cough productive of yellow sputum and wheezing. Hypokalemic on 5/8. No bmet this morning.   Review of Systems: Review of Systems  Constitutional: Positive for malaise/fatigue. Negative for fever, chills and diaphoresis.  Respiratory: Positive for cough, sputum production, shortness of breath and wheezing. Negative for hemoptysis.        Yellow sputum  Cardiovascular: Negative for chest pain, palpitations, orthopnea, claudication, leg swelling and PND.  Gastrointestinal: Negative for diarrhea and constipation.  Neurological: Positive for weakness.    Objective: Telemetry: not on telemetry Physical Exam: Blood pressure 103/53, pulse 73, temperature 98.3 F (36.8 C), temperature source Oral, resp. rate 18, height 5\' 4"  (1.626 m), weight 170 lb 9.6 oz (77.384 kg), SpO2 96 %. Body mass index is 29.27 kg/(m^2). General: Well developed, well nourished, in no acute distress. Head: Normocephalic, atraumatic, sclera non-icteric, no xanthomas, nares are without discharge. Neck: Negative for carotid bruits. JVP elevated. Lungs: Crackles bilaterally. Breathing is unlabored, on 4L via nasal cannula.  Heart: RRR S1 S2. 2/6 systolic murmurs. No rubs or gallops.  Abdomen: Soft, non-tender, non-distended with normoactive bowel sounds. No rebound/guarding. Extremities: No clubbing or cyanosis. No edema. Multiple bruises. Neuro: Alert and oriented X 3. Moves all extremities spontaneously. Psych:  Responds to questions appropriately with a normal affect.   Intake/Output Summary (Last 24 hours) at 05/05/15 1310 Last data filed at 05/05/15 0900  Gross per 24 hour  Intake    609 ml  Output      1 ml  Net    608 ml    Inpatient Medications:  . b complex-vitamin c-folic acid  1 tablet Oral QHS  . calcium acetate  667 mg Oral TID WC  . cinacalcet  30 mg Oral QHS  .  feeding supplement (NEPRO CARB STEADY)  237 mL Oral Q24H  . heparin  5,000 Units Subcutaneous 3 times per day  . ipratropium-albuterol  3 mL Nebulization TID  . [START ON 05/06/2015] levofloxacin  500 mg Oral Q48H  . levothyroxine  125 mcg Oral QAC breakfast  . pantoprazole  40 mg Oral QAC breakfast  . sevelamer carbonate  2,400 mg Oral TID WC   Infusions:    Labs:  Recent Labs  05/03/15 1430 05/04/15 0152  NA 133* 138  K 3.2* 3.2*  CL 91* 95*  CO2 29 31  GLUCOSE 111* 102*  BUN 26* 14  CREATININE 6.08* 4.15*  CALCIUM 8.0* 8.4*  PHOS 3.2  --     Recent Labs  05/03/15 1430  ALBUMIN 2.9*    Recent Labs  05/03/15 1430  WBC 4.2  HGB 9.0*  HCT 28.7*  MCV 89.9  PLT 140*   No results for input(s): CKTOTAL, CKMB, TROPONINI in the last 72 hours. Invalid input(s): POCBNP No results for input(s): HGBA1C in the last 72 hours.   Weights: Filed Weights   05/03/15 1430 05/04/15 0500 05/05/15 0544  Weight: 171 lb 11.8 oz (77.9 kg) 176 lb 1 oz (79.861 kg) 170 lb 9.6 oz (77.384 kg)     Radiology/Studies:  Dg Chest Port 1 View  05/03/2015   CLINICAL DATA:  74 year old female with a history of interstitial lung disease. Increasing cough  EXAM: PORTABLE CHEST - 1 VIEW  COMPARISON:  05/01/2015, 12/30/2014 chest CT 12/05/2012  FINDINGS: Cardiomediastinal silhouette unchanged. Calcifications of the aortic arch and descending aorta.  Low lung  volumes. Mixed interstitial and airspace disease of the right lung periphery.  No pneumothorax or pleural effusion.  No displaced fracture.  Vascular calcifications of the upper extremities.  IMPRESSION: Mixed interstitial and airspace disease of the right greater than left lung. This is most concerning for infection given the patient's history. Background of interstitial lung disease/ scarring may also be present, though there is no recent CT comparison, and the current appearance would represent significant progression.  Atherosclerosis.  Signed,   Dulcy Fanny. Earleen Newport, DO  [facility]  [facility]   Electronically Signed   By: Corrie Mckusick D.O.   On: 05/03/2015 11:42   Dg Chest Port 1 View  05/01/2015   CLINICAL DATA:  Sepsis.  EXAM: PORTABLE CHEST - 1 VIEW  COMPARISON:  12/30/2014.  12/25/2014.  FINDINGS: Mediastinum and hilar structures are normal. Chronic interstitial prominence consistent with chronic interstitial lung disease. Bibasilar subsegmental atelectasis and/or infiltrates. Pleural parenchymal thickening again noted consistent with scarring. Cardiomegaly with normal pulmonary vascularity. No pleural effusion or pneumothorax.  IMPRESSION: 1. Chronic interstitial lung disease. Pleural parenchymal scarring . 2. Mild bibasilar subsegmental atelectasis and/or infiltrates. 3. Stable cardiomegaly.   Electronically Signed   By: Marcello Moores  Register   On: 05/01/2015 08:30     Assessment and Plan  1. Elevated troponin:  -First troponin of 2.35, repeat was 3.5. This could be due to demand ischemia with presumed HCAP and hypoxia. However, the patient has multiple risk factors for coronary artery disease (HTN, HLD, prior tobacco abuse, obesity). Echo with EF 50-55%, moderate RV dilation/moderately decreased RV systolic function.  -Once she is stable would evaluate further with either a pharmacologic nuclear stress test.  -RV dysfunction raises concern for PE but lung exam more suggestive of PNA and volume overload.   2. Respiratory failure/PNA/sepsis:  -Continues to improve, but still crackles on lung exam. She remains volume overloaded.  -On Zosyn and vancomycin, continue for suspected HCAP. -Blood cultures NGTD -Still with excess fluid => dialysis per renal but still needs more fluid off.  3. Arrhythmia: PACs, possible runs of atrial fibrillation with aberrancy (short).  -Hold off on beta blocker initiation today with acute wheezing, acute wheezing overnight into 5/9, and acute CHF.   4.  ESRD on HD:  -As above, needs more fluid off.   5. Anemia of CKD:  6. Hypokalemia: -BMET pending this AM -Per dialysis    Signed, Christell Faith, PA-C

## 2015-05-05 NOTE — Progress Notes (Signed)
Dr. Lavetta Nielsen notified of patients c/o cough. New orders entered by Dr. Lavetta Nielsen.

## 2015-05-05 NOTE — Progress Notes (Signed)
HD START 

## 2015-05-05 NOTE — Progress Notes (Signed)
HD END 

## 2015-05-05 NOTE — Progress Notes (Signed)
PT Cancellation Note  Patient Details Name: [name] MRN: [id number] DOB: 23-Sep-1941   Cancelled Treatment:    Reason Eval/Treat Not Completed: Patient declined, no reason specified;Other (comment) (Attempted therapy in am pt requesting to return in afternoon as not feeling well.  Returned in pm pt stating now actively nauesous and vomiting deferred for today.)   Quita Mcgrory 05/05/2015, 2:14 PM Chelly Dombeck, PTA

## 2015-05-06 LAB — CULTURE, BLOOD (ROUTINE X 2)
CULTURE: NO GROWTH
CULTURE: NO GROWTH
Special Requests: NORMAL
Special Requests: NORMAL

## 2015-05-06 LAB — GLUCOSE, CAPILLARY
GLUCOSE-CAPILLARY: 84 mg/dL (ref 70–99)
GLUCOSE-CAPILLARY: 99 mg/dL (ref 70–99)
Glucose-Capillary: 92 mg/dL (ref 70–99)

## 2015-05-06 LAB — LACTIC ACID, PLASMA
Lactic Acid, Venous: 0.9 mmol/L (ref 0.5–2.0)
Lactic Acid, Venous: 0.8 mmol/L (ref 0.5–2.0)

## 2015-05-06 MED ORDER — EPOETIN ALFA 10000 UNIT/ML IJ SOLN
10000.0000 [IU] | Freq: Once | INTRAMUSCULAR | Status: AC
  Administered 2015-05-06: 10000 [IU] via SUBCUTANEOUS

## 2015-05-06 MED ORDER — GUAIFENESIN-DM 100-10 MG/5ML PO SYRP: 5.0000 mL | ORAL_SOLUTION | ORAL | Status: DC | PRN

## 2015-05-06 MED ORDER — NEPHRO-VITE 0.8 MG PO TABS: 1.0000 | ORAL_TABLET | Freq: Every day | ORAL | Status: AC

## 2015-05-06 MED ORDER — LEVOFLOXACIN 500 MG PO TABS: 500.0000 mg | ORAL_TABLET | ORAL | Status: DC

## 2015-05-06 NOTE — Progress Notes (Signed)
Subjective:   Reports  Excessive coughing, sputum production. No fever Patient reports improvement after 2 liters were removed with Hemodialysis  Objective:  Vital signs in last 24 hours:  Temp:  [97.4 F (36.3 C)-98.3 F (36.8 C)] 97.7 F (36.5 C) (05/10 0935) Pulse Rate:  [66-135] 76 (05/10 1030) Resp:  [12-43] 43 (05/10 1030) BP: (73-134)/(53-103) 114/91 mmHg (05/10 1030) SpO2:  [90 %-99 %] 99 % (05/10 1030) Weight:  [75.6 kg (166 lb 10.7 oz)-77.021 kg (169 lb 12.8 oz)] 75.6 kg (166 lb 10.7 oz) (05/10 0935)  Weight change: -0.363 kg (-12.8 oz) Filed Weights   05/05/15 0544 05/06/15 0500 05/06/15 0935  Weight: 77.384 kg (170 lb 9.6 oz) 77.021 kg (169 lb 12.8 oz) 75.6 kg (166 lb 10.7 oz)    Intake/Output: I/O last 3 completed shifts: In: 480 [P.O.:480] Out: 2002 [Urine:2; Other:2000]   Intake/Output this Reynolds:     Physical Exam: General: NAD, nonlabored breathing  Head: Normocephalic, atraumatic.  Eyes: Anicteric,  Neck: Supple, trachea midline  Lungs:  Significant crackles b/l, Left worse than right  Heart: Regular rate and rhythm  Abdomen:  Soft, nontender,   Extremities: ++ dependent peripheral edema.  Neurologic: Nonfocal, moving all four extremities  Skin: No lesions  Access: Left arm AVG    Basic Metabolic Panel:  Recent Labs Lab 05/01/15 0755 05/01/15 1232 05/02/15 0630 05/03/15 1430 05/04/15 0152 05/05/15 1630  NA 138  --  140 133* 138 136  K 4.3  --  3.3* 3.2* 3.2* 3.9  CL 89*  --  96* 91* 95* 92*  CO2 20*  --  32 29 31 31   GLUCOSE 84  --  96 111* 102* 116*  BUN 32*  --  20 26* 14 25*  CREATININE 6.95* 6.63* 4.39* 6.08* 4.15* 6.16*  CALCIUM 8.5*  --  8.2* 8.0* 8.4* 8.2*  MG  --  1.9  --   --   --  1.9  PHOS  --  7.0*  --  3.2  --   --     Liver Function Tests:  Recent Labs Lab 05/01/15 0755 05/03/15 1430  AST 60*  --   ALT 13*  --   ALKPHOS 75  --   BILITOT 2.2*  --   PROT 7.6  --   ALBUMIN 3.6 2.9*    Recent Labs Lab  05/01/15 1232  LIPASE 38   No results for input(s): AMMONIA in the last 168 hours.  CBC:  Recent Labs Lab 05/01/15 0755 05/01/15 1232 05/02/15 0630 05/03/15 1430 05/05/15 1630  WBC 8.1 6.1 3.4* 4.2 3.3*  NEUTROABS 6.9*  --   --   --   --   HGB 10.6* 9.7* 9.5* 9.0* 8.8*  HCT 34.0* 31.0* 29.7* 28.7* 27.8*  MCV 92.7 90.9 89.7 89.9 90.6  PLT 154 105* 119* 140* 127*    Cardiac Enzymes:  Recent Labs Lab 05/01/15 0755 05/01/15 1232 05/01/15 1715 05/02/15 0012  TROPONINI 2.35* 2.81* 3.52* 3.20*    BNP: Invalid input(s): POCBNP  CBG:  Recent Labs Lab 05/05/15 1121 05/05/15 2011 05/06/15 0018 05/06/15 0341 05/06/15 0726  GLUCAP 109* 88 99 84 92    Microbiology: Results for orders placed or performed during the hospital encounter of 05/01/15  Blood Culture (routine x 2)     Status: None (Preliminary result)   Collection Time: 05/01/15  7:55 AM  Result Value Ref Range Status   Specimen Description BLOOD  Final   Special Requests Normal  Final  Culture NO GROWTH 4 DAYS  Final   Report Status PENDING  Incomplete  Blood Culture (routine x 2)     Status: None (Preliminary result)   Collection Time: 05/01/15  7:57 AM  Result Value Ref Range Status   Specimen Description BLOOD  Final   Special Requests Normal  Final   Culture NO GROWTH 4 DAYS  Final   Report Status PENDING  Incomplete  Urine culture     Status: None   Collection Time: 05/01/15  9:00 AM  Result Value Ref Range Status   Specimen Description URINE, RANDOM  Final   Special Requests Normal  Final   Culture   Final    80,000 COLONIES/ml SERRATIA MARCESCENS 50,000 COLONIES/mL CITROBACTER FREUNDII    Report Status 05/04/2015 FINAL  Final   Organism ID, Bacteria SERRATIA MARCESCENS  Final   Organism ID, Bacteria CITROBACTER FREUNDII  Final      Susceptibility   Citrobacter freundii - MIC*    CEFTAZIDIME Value in next row       <=1SENSITIVE    CEFAZOLIN Value in next row       >=64RESISTANT     CEFTRIAXONE Value in next row Sensitive      >=64RESISTANT    CIPROFLOXACIN Value in next row Sensitive      >=64RESISTANT    GENTAMICIN Value in next row Sensitive      >=64RESISTANT    IMIPENEM Value in next row Sensitive      >=64RESISTANT    TRIMETH/SULFA Value in next row Sensitive      >=64RESISTANT    CEFOXITIN Value in next row Resistant      >=64RESISTANT    * 50,000 COLONIES/mL CITROBACTER FREUNDII   Serratia marcescens - MIC*    CEFTAZIDIME Value in next row       <=1SENSITIVE    CEFAZOLIN Value in next row       >=64RESISTANT    CEFTRIAXONE Value in next row Sensitive      >=64RESISTANT    CIPROFLOXACIN Value in next row Sensitive      >=64RESISTANT    GENTAMICIN Value in next row Sensitive      >=64RESISTANT    TRIMETH/SULFA Value in next row Sensitive      >=64RESISTANT    CEFOXITIN Value in next row Resistant      >=64RESISTANT    * 80,000 COLONIES/ml SERRATIA MARCESCENS  Culture, sputum-assessment     Status: None   Collection Time: 05/01/15  9:00 AM  Result Value Ref Range Status   Specimen Description SPUTUM  Final   Special Requests Normal  Final   Sputum evaluation   Final    Sputum specimen not acceptable for testing.  Please recollect.   Results Called to: Loletha Grayer AT 1322 05/01/15 LAB    Report Status 05/03/2015 FINAL  Final    Coagulation Studies: No results for input(s): LABPROT, INR in the last 72 hours.  Urinalysis: No results for input(s): COLORURINE, LABSPEC, PHURINE, GLUCOSEU, HGBUR, BILIRUBINUR, KETONESUR, PROTEINUR, UROBILINOGEN, NITRITE, LEUKOCYTESUR in the last 72 hours.  Invalid input(s): APPERANCEUR    Imaging: No results found.   Medications:     . b complex-vitamin c-folic acid  1 tablet Oral QHS  . calcium acetate  667 mg Oral TID WC  . cinacalcet  30 mg Oral QHS  . feeding supplement (NEPRO CARB STEADY)  237 mL Oral Q24H  . heparin  5,000 Units Subcutaneous 3 times per day  . ipratropium-albuterol  3 mL Nebulization  TID  . levofloxacin  500 mg Oral Q48H  . levothyroxine  125 mcg Oral QAC breakfast  . pantoprazole  40 mg Oral QAC breakfast  . sevelamer carbonate  2,400 mg Oral TID WC   sodium chloride, acetaminophen **OR** [DISCONTINUED] acetaminophen, guaiFENesin-codeine, ipratropium-albuterol, menthol-cetylpyridinium, ondansetron **OR** ondansetron (ZOFRAN) IV, senna-docusate, tetrahydrozoline  Assessment/ Plan:   [name] is a 74 y.o. black female with end-stage renal disease on HD since 2002, hypertension, hypothyroidism, emphysema, AOCD, secondary hyperparathyroidism, colonic perforation s/p repair 12/05/12, colon cancer status post colectomy   Admitted for hypoglycemia and pulmonary edema on 05/01/2015  [name] [name] [name]  1. ESRD on HD TTHS: with noncardiogenic pulm edema   - regular HD treatment today. Goal UF 2000 cc as tolerated  2. Anemia of CKD:  micera as outpatient.  - epo SQ as inpatient    3. SHPTH: continue cinacalcet and calcium acetate   4. Pneumonia J18.9: concern for sepsis with increased lactic acid level. Metabolic acidosis.  - empiric antibiotics with zosyn and vancomycin. Renally dosed. Pharmacy following.     LOS: 5 [name] 5/10/201610:45 AM

## 2015-05-06 NOTE — Progress Notes (Signed)
Pre-hd tx 

## 2015-05-06 NOTE — Discharge Summary (Signed)
[facility] [place] NAME: [name]    MR#:  [id number]  DATE OF BIRTH:  [birth year]  DATE OF ADMISSION:  05/01/2015 ADMITTING PHYSICIAN: Lance Coon, MD  DATE OF DISCHARGE: 05/06/2015  PRIMARY CARE PHYSICIAN: Madelyn Brunner, MD    ADMISSION DIAGNOSIS:  Hypoglycemia [E16.2] Acute non-ST-elevation myocardial infarction [I21.4] Healthcare-associated pneumonia [J18.9] End stage renal failure on dialysis [N18.6, Z99.2] Sepsis, due to unspecified organism [A41.9]  DISCHARGE DIAGNOSIS:  Principal Problem:   Sepsis Active Problems:   ESRD (end stage renal disease) on dialysis   HTN (hypertension)   COPD (chronic obstructive pulmonary disease)   Hypothyroidism   HCAP (healthcare-associated pneumonia)   Hypoglycemia   Elevated troponin   Diastolic congestive heart failure   Acute respiratory failure UTI  SECONDARY DIAGNOSIS:   Past Medical History  Diagnosis Date  . Hypertension   . Hyperlipidemia   . Thyroid disease     Hypothyroidism  . ESRD (end stage renal disease) on dialysis   . Autoimmune thrombocytopenia   . COPD (chronic obstructive pulmonary disease)   . Diverticulitis 2013 sept.      hospitalized  at [place].  . Anginal pain   . Hypothyroidism   . Shortness of breath   . Pneumonia   . Chronic diastolic CHF (congestive heart failure)   . GERD (gastroesophageal reflux disease)   . Arthritis     HOSPITAL COURSE:   74 year old female with past medical history of end-stage renal disease and hemodialysis COPD hypertension secondary hyperparathyroidism GERD who presented to the hospital but shortness of breath   #1 Acute respiratory failure with hypoxemia - likely due to combination of mild CHF and pneumonia.  Patient was treated with broad-spectrum IV antibiotics for pneumonia with vancomycin and Zosyn and then weaned off to just Levaquin and is being discharged on Levaquin presently. Patient also had  extra dialysis sessions to get fluid removed and after that her shortness of breath and hypoxemia significantly improved and she is therefore being discharged home with home health services she is only on oxygen at home.  #2 Sepsis - this was thought to be secondary to pneumonia. Patient was empirically treated with broad-spectrum IV antibiotics with vancomycin and Zosyn. Patient was then weaned off the vancomycin Zosyn and switched over to IV Levaquin and is currently being discharged on that patient's blood cultures and sputum cultures have remained negative and hospital and she has been afebrile and hemodynamically stable now for the past 48 hours  #3 HCAP (healthcare-associated pneumonia) This is likely the cause of patient's sepsis patient was empirically treated with vancomycin and Zosyn and then weaned off to IV Levaquin and is currently being discharged on oral Levaquin as mentioned above  #4 urinary tract infection this is based off a urine culture as noted below patient is being discharged on oral Levaquin which should cover Citrobacter and Serratia noted in the urine culture.  #5 Elevated troponin - in the setting of end-stage renal disease on dialysis and also demand ischemia.  Patient was seen by cardiology did not recommend acute intervention patient will follow up with them as an outpatient for possible need for outpatient stress test or catheter if needed. Patient is clinically chest pain free  #6 ESRD (end stage renal disease) on dialysis patient was seen in the nephrology in the hospital did have an extra 2 sessions of dialysis to help extra fluid removal.  Patient has clinically improved and will resume her  dialysis on Tuesday Thursday Saturday.    #7 COPD (chronic obstructive pulmonary disease) - no acute exacerbation she was maintained on as needed duo nebs and strongly advised to quit smoking she is on an oxygen at home  #8 Hypothyroidism - cont. Synthroid.   #9 Secondary  Hyperparathyroidism - cont. Sensipar.    DISCHARGE CONDITIONS:   Stable.   CONSULTS OBTAINED:  Treatment Team:  Wellington Hampshire, MD  DRUG ALLERGIES:   Allergies  Allergen Reactions  . Shellfish-Derived Products Anaphylaxis    DISCHARGE MEDICATIONS:   Current Discharge Medication List    START taking these medications   Details  guaiFENesin-dextromethorphan (ROBITUSSIN DM) 100-10 MG/5ML syrup Take 5 mLs by mouth every 4 (four) hours as needed for cough. Qty: 118 mL, Refills: 0    levofloxacin (LEVAQUIN) 500 MG tablet Take 1 tablet (500 mg total) by mouth every other day. Qty: 10 tablet, Refills: 0      CONTINUE these medications which have CHANGED   Details  b complex-vitamin c-folic acid (NEPHRO-VITE) 0.8 MG TABS tablet Take 1 tablet by mouth at bedtime. Qty: 30 tablet, Refills: 1      CONTINUE these medications which have NOT CHANGED   Details  acetaminophen (TYLENOL) 500 MG tablet Take 1,000 mg by mouth 2 (two) times daily as needed for headache.     albuterol-ipratropium (COMBIVENT) 18-103 MCG/ACT inhaler Inhale 2 puffs into the lungs 2 (two) times daily as needed for wheezing or shortness of breath.     calcium acetate (PHOSLO) 667 MG capsule Take 667 mg by mouth 3 (three) times daily with meals.    cinacalcet (SENSIPAR) 30 MG tablet Take 30 mg by mouth every evening.     diphenhydrAMINE (BENADRYL) 25 mg capsule Take 25 mg by mouth 3 (three) times daily as needed for itching or sleep.     isosorbide mononitrate (IMDUR) 60 MG 24 hr tablet Take 60 mg by mouth daily.     levothyroxine (SYNTHROID, LEVOTHROID) 125 MCG tablet Take 125 mcg by mouth daily.     metoCLOPramide (REGLAN) 5 MG tablet Take 5 mg by mouth 2 (two) times daily before a meal.    ondansetron (ZOFRAN) 4 MG tablet Take 4 mg by mouth every 8 (eight) hours as needed for nausea or vomiting.    pantoprazole (PROTONIX) 40 MG tablet Take 40 mg by mouth 2 (two) times daily.    sevelamer (RENVELA)  800 MG tablet Take 1,600-2,400 mg by mouth See admin instructions. Pt takes three tablets before meals and two tablets with snacks.      STOP taking these medications     Tetrahydrozoline HCl (VISINE OP)          DISCHARGE INSTRUCTIONS:   DIET:  Renal diet  DISCHARGE CONDITION:  Good  ACTIVITY:  Activity as tolerated  OXYGEN:  Home Oxygen: Yes.     Oxygen Delivery: 2 liters/min via Patient connected to nasal cannula oxygen  DISCHARGE LOCATION:  Home w/ Home health RN/PT   If you experience worsening of your admission symptoms, develop shortness of breath, life threatening emergency, suicidal or homicidal thoughts you must seek medical attention immediately by calling 911 or calling your MD immediately  if symptoms less severe.  You Must read complete instructions/literature along with all the possible adverse reactions/side effects for all the Medicines you take and that have been prescribed to you. Take any new Medicines after you have completely understood and accpet all the possible adverse reactions/side effects.   Please  note  You were cared for by a hospitalist during your hospital stay. If you have any questions about your discharge medications or the care you received while you were in the hospital after you are discharged, you can call the unit and asked to speak with the hospitalist on call if the hospitalist that took care of you is not available. Once you are discharged, your primary care physician will handle any further medical issues. Please note that NO REFILLS for any discharge medications will be authorized once you are discharged, as it is imperative that you return to your primary care physician (or establish a relationship with a primary care physician if you do not have one) for your aftercare needs so that they can reassess your need for medications and monitor your lab values.     Today    VITAL SIGNS:  Blood pressure 111/76, pulse 70, temperature  97.7 F (36.5 C), temperature source Oral, resp. rate 20, height 5\' 4"  (1.626 m), weight 74.7 kg (164 lb 10.9 oz), SpO2 99 %.  I/O:   Intake/Output Summary (Last 24 hours) at 05/06/15 1521 Last data filed at 05/06/15 1312  Gross per 24 hour  Intake      0 ml  Output   4001 ml  Net  -4001 ml    PHYSICAL EXAMINATION:  GENERAL:  74 y.o.-year-old patient lying in the bed with no acute distress.  EYES: Pupils equal, round, reactive to light and accommodation. No scleral icterus. Extraocular muscles intact.  HEENT: Head atraumatic, normocephalic. Oropharynx and nasopharynx clear.  NECK:  Supple, no jugular venous distention. No thyroid enlargement, no tenderness.  LUNGS:  Minimal bibasilar crackles, no wheezin, rhonchi.  (-) use of accessory muscles.  CARDIOVASCULAR: S1, S2 normal. No murmurs, rubs, or gallops.  ABDOMEN: Soft, non-tender, non-distended. Bowel sounds present. No organomegaly or mass.  EXTREMITIES: No pedal edema, cyanosis, or clubbing.  NEUROLOGIC: Cranial nerves II through XII are intact. No focal motor or sensory defecits b/l. PSYCHIATRIC: The patient is alert and oriented x 3.  SKIN: No obvious rash, lesion, or ulcer.   DATA REVIEW:   CBC  Recent Labs Lab 05/05/15 1630  WBC 3.3*  HGB 8.8*  HCT 27.8*  PLT 127*    Chemistries   Recent Labs Lab 05/01/15 0755  05/05/15 1630  NA 138  < > 136  K 4.3  < > 3.9  CL 89*  < > 92*  CO2 20*  < > 31  GLUCOSE 84  < > 116*  BUN 32*  < > 25*  CREATININE 6.95*  < > 6.16*  CALCIUM 8.5*  < > 8.2*  MG  --   < > 1.9  AST 60*  --   --   ALT 13*  --   --   ALKPHOS 75  --   --   BILITOT 2.2*  --   --   < > = values in this interval not displayed.  Cardiac Enzymes  Recent Labs Lab 05/02/15 0012  TROPONINI 3.20*    Microbiology Results  Results for orders placed or performed during the hospital encounter of 05/01/15  Blood Culture (routine x 2)     Status: None   Collection Time: 05/01/15  7:55 AM  Result  Value Ref Range Status   Specimen Description BLOOD  Final   Special Requests Normal  Final   Culture NO GROWTH 5 DAYS  Final   Report Status 05/06/2015 FINAL  Final  Blood Culture (routine  x 2)     Status: None   Collection Time: 05/01/15  7:57 AM  Result Value Ref Range Status   Specimen Description BLOOD  Final   Special Requests Normal  Final   Culture NO GROWTH 5 DAYS  Final   Report Status 05/06/2015 FINAL  Final  Urine culture     Status: None   Collection Time: 05/01/15  9:00 AM  Result Value Ref Range Status   Specimen Description URINE, RANDOM  Final   Special Requests Normal  Final   Culture   Final    80,000 COLONIES/ml SERRATIA MARCESCENS 50,000 COLONIES/mL CITROBACTER FREUNDII    Report Status 05/04/2015 FINAL  Final   Organism ID, Bacteria SERRATIA MARCESCENS  Final   Organism ID, Bacteria CITROBACTER FREUNDII  Final      Susceptibility   Citrobacter freundii - MIC*    CEFTAZIDIME Value in next row       <=1SENSITIVE    CEFAZOLIN Value in next row       >=64RESISTANT    CEFTRIAXONE Value in next row Sensitive      >=64RESISTANT    CIPROFLOXACIN Value in next row Sensitive      >=64RESISTANT    GENTAMICIN Value in next row Sensitive      >=64RESISTANT    IMIPENEM Value in next row Sensitive      >=64RESISTANT    TRIMETH/SULFA Value in next row Sensitive      >=64RESISTANT    CEFOXITIN Value in next row Resistant      >=64RESISTANT    * 50,000 COLONIES/mL CITROBACTER FREUNDII   Serratia marcescens - MIC*    CEFTAZIDIME Value in next row       <=1SENSITIVE    CEFAZOLIN Value in next row       >=64RESISTANT    CEFTRIAXONE Value in next row Sensitive      >=64RESISTANT    CIPROFLOXACIN Value in next row Sensitive      >=64RESISTANT    GENTAMICIN Value in next row Sensitive      >=64RESISTANT    TRIMETH/SULFA Value in next row Sensitive      >=64RESISTANT    CEFOXITIN Value in next row Resistant      >=64RESISTANT    * 80,000 COLONIES/ml SERRATIA  MARCESCENS  Culture, sputum-assessment     Status: None   Collection Time: 05/01/15  9:00 AM  Result Value Ref Range Status   Specimen Description SPUTUM  Final   Special Requests Normal  Final   Sputum evaluation   Final    Sputum specimen not acceptable for testing.  Please recollect.   Results Called to: Loletha Grayer AT 4332 05/01/15 LAB    Report Status 05/03/2015 FINAL  Final    RADIOLOGY:  No results found.    Management plans discussed with the patient, family and they are in agreement.  CODE STATUS:     Code Status Orders        Start     Ordered   05/01/15 1049  Limited resuscitation (code)   Continuous    Question Answer Comment  In the event of cardiac or respiratory ARREST: Initiate Code Blue, Call Rapid Response Yes   In the event of cardiac or respiratory ARREST: Perform CPR No   In the event of cardiac or respiratory ARREST: Perform Intubation/Mechanical Ventilation Yes   In the event of cardiac or respiratory ARREST: Use NIPPV/BiPAp only if indicated Yes   In the event of cardiac  or respiratory ARREST: Administer ACLS medications if indicated No   In the event of cardiac or respiratory ARREST: Perform Defibrillation or Cardioversion if indicated No      05/01/15 1049      TOTAL TIME TAKING CARE OF THIS PATIENT: 35 minutes.    Henreitta Leber M.D on 05/06/2015 at 3:21 PM  Between 7am to 6pm - Pager - [phone]  After 6pm go to [email or web address] - password [email or web address] [facility]  Office  [phone]  CC: Primary care physician; Madelyn Brunner, MD

## 2015-05-06 NOTE — Progress Notes (Signed)
This note also relates to the following rows which could not be included: Resp - Cannot attach notes to unvalidated device data   Post hd tx

## 2015-05-06 NOTE — Progress Notes (Signed)
PT Cancellation Note  Patient Details Name: [name] MRN: [id number] DOB: August 14, 1941   Cancelled Treatment:    Reason Eval/Treat Not Completed: Patient at procedure or test/unavailable (hemodialysis)   Alan Riles 05/06/2015, 11:22 AM Tram Wrenn, PTA

## 2015-05-06 NOTE — Care Management (Signed)
Discharge to home today after dialysis per Dr. Heron Sabins. Spoke with Ms. Spindler at the bedside. States she gets her home oxygen thru [facility] for several years. Home Health thru [facility] in the past. [place]. Gets Dialysis [facility]. Daughter will transport. Shelbie Ammons RN MSN Care Management [phone]

## 2015-05-06 NOTE — Progress Notes (Signed)
This note also relates to the following rows which could not be included: BP - Cannot attach notes to unvalidated device data   HD tx completed

## 2015-05-06 NOTE — Progress Notes (Signed)
HD tx start 

## 2015-05-06 NOTE — Discharge Instructions (Signed)
°  DIET:  Renal diet  DISCHARGE CONDITION:  Stable  ACTIVITY:  Activity as tolerated  OXYGEN:  Home Oxygen: No.   Oxygen Delivery: room air  DISCHARGE LOCATION:  Home with Home health RN/PT   If you experience worsening of your admission symptoms, develop shortness of breath, life threatening emergency, suicidal or homicidal thoughts you must seek medical attention immediately by calling 911 or calling your MD immediately  if symptoms less severe.  You Must read complete instructions/literature along with all the possible adverse reactions/side effects for all the Medicines you take and that have been prescribed to you. Take any new Medicines after you have completely understood and accpet all the possible adverse reactions/side effects.   Please note  You were cared for by a hospitalist during your hospital stay. If you have any questions about your discharge medications or the care you received while you were in the hospital after you are discharged, you can call the unit and asked to speak with the hospitalist on call if the hospitalist that took care of you is not available. Once you are discharged, your primary care physician will handle any further medical issues. Please note that NO REFILLS for any discharge medications will be authorized once you are discharged, as it is imperative that you return to your primary care physician (or establish a relationship with a primary care physician if you do not have one) for your aftercare needs so that they can reassess your need for medications and monitor your lab values.

## 2015-05-06 NOTE — Plan of Care (Signed)
Problem: Discharge Progression Outcomes Goal: Discharge plan in place and appropriate Outcome: Progressing Individualization of care :  Patient goes by [name] and is from home. HD patient, dialysis schedule is T/Th/Sat.   PMH: HTN, Hyperlipidemia, Hypothyroidism, CKD, Autoimmune Thrombocytopenia, COPD, Diverticulitis, Aginal pain, Pneumonia, GERD, Diastolic CHF, and arthritis; currently controlled with home medications.      Goal: Other Discharge Outcomes/Goals Outcome: Progressing Plan of care progress to goal: Denies pain this shift. -denies SOB this shift -HD patient, dialysis yesterday. Pt for HD again today this possible discharge home with home health. Normal schedule is T/Th/Sat.   -Changed to po antibiotics. -Tolerating diet and snacks this shift.   - FSBS stable. -Lactic acid level improving.

## 2015-05-07 LAB — PARATHYROID HORMONE, INTACT (NO CA): PTH: 220 pg/mL — ABNORMAL HIGH (ref 15–65)

## 2015-06-13 ENCOUNTER — Encounter: Payer: Self-pay | Admitting: *Deleted

## 2015-06-13 ENCOUNTER — Encounter
Admission: RE | Disposition: A | Discharge status: Home / Self Care | Payer: Self-pay | Source: Ambulatory Visit | Attending: Vascular Surgery

## 2015-06-13 ENCOUNTER — Ambulatory Visit
Admission: RE | Admit: 2015-06-13 | Discharge: 2015-06-13 | Disposition: A | Discharge status: Home / Self Care | Payer: Medicare Other | Source: Ambulatory Visit | Attending: Vascular Surgery | Admitting: Vascular Surgery

## 2015-06-13 DIAGNOSIS — Z992 Dependence on renal dialysis: Secondary | ICD-10-CM | POA: Diagnosis not present

## 2015-06-13 DIAGNOSIS — T82858A Stenosis of vascular prosthetic devices, implants and grafts, initial encounter: Secondary | ICD-10-CM | POA: Insufficient documentation

## 2015-06-13 DIAGNOSIS — I509 Heart failure, unspecified: Secondary | ICD-10-CM | POA: Diagnosis not present

## 2015-06-13 DIAGNOSIS — N186 End stage renal disease: Secondary | ICD-10-CM | POA: Diagnosis not present

## 2015-06-13 DIAGNOSIS — I120 Hypertensive chronic kidney disease with stage 5 chronic kidney disease or end stage renal disease: Secondary | ICD-10-CM | POA: Insufficient documentation

## 2015-06-13 DIAGNOSIS — E669 Obesity, unspecified: Secondary | ICD-10-CM | POA: Diagnosis not present

## 2015-06-13 DIAGNOSIS — J449 Chronic obstructive pulmonary disease, unspecified: Secondary | ICD-10-CM | POA: Diagnosis not present

## 2015-06-13 HISTORY — PX: PERIPHERAL VASCULAR CATHETERIZATION: SHX172C

## 2015-06-13 LAB — POTASSIUM (ARMC VASCULAR LAB ONLY): Potassium (ARMC vascular lab): 3.7

## 2015-06-13 SURGERY — A/V SHUNTOGRAM/FISTULAGRAM
Anesthesia: Moderate Sedation | Laterality: Left

## 2015-06-13 MED ORDER — SODIUM CHLORIDE 0.9 % IV SOLN
INTRAVENOUS | Status: DC
  Administered 2015-06-13: 11:00:00 via INTRAVENOUS

## 2015-06-13 MED ORDER — LIDOCAINE HCL (PF) 1 % IJ SOLN
INTRAMUSCULAR | Status: AC
  Filled 2015-06-13: qty 10

## 2015-06-13 MED ORDER — FENTANYL CITRATE (PF) 100 MCG/2ML IJ SOLN
INTRAMUSCULAR | Status: AC
  Filled 2015-06-13: qty 2

## 2015-06-13 MED ORDER — IOHEXOL 350 MG/ML SOLN
INTRAVENOUS | Status: DC | PRN
  Administered 2015-06-13: 45 mL via INTRAVENOUS

## 2015-06-13 MED ORDER — FAMOTIDINE 20 MG PO TABS
ORAL_TABLET | ORAL | Status: AC
  Filled 2015-06-13: qty 1

## 2015-06-13 MED ORDER — FENTANYL CITRATE (PF) 100 MCG/2ML IJ SOLN
INTRAMUSCULAR | Status: DC | PRN
  Administered 2015-06-13: 50 ug via INTRAVENOUS
  Administered 2015-06-13 (×2): 25 ug via INTRAVENOUS

## 2015-06-13 MED ORDER — CEFAZOLIN SODIUM 1-5 GM-% IV SOLN
1.0000 g | Freq: Once | INTRAVENOUS | Status: AC
  Administered 2015-06-13: 1 g via INTRAVENOUS

## 2015-06-13 MED ORDER — HEPARIN SODIUM (PORCINE) 1000 UNIT/ML IJ SOLN
INTRAMUSCULAR | Status: AC
  Filled 2015-06-13: qty 1

## 2015-06-13 MED ORDER — CEFAZOLIN SODIUM 1-5 GM-% IV SOLN
INTRAVENOUS | Status: AC
  Filled 2015-06-13: qty 50

## 2015-06-13 MED ORDER — METHYLPREDNISOLONE SODIUM SUCC 125 MG IJ SOLR: 125.0000 mg | Freq: Once | INTRAMUSCULAR | Status: DC

## 2015-06-13 MED ORDER — MIDAZOLAM HCL 2 MG/2ML IJ SOLN
INTRAMUSCULAR | Status: DC | PRN
  Administered 2015-06-13: 2 mg via INTRAVENOUS
  Administered 2015-06-13: 1 mg via INTRAVENOUS
  Administered 2015-06-13: 2 mg via INTRAVENOUS

## 2015-06-13 MED ORDER — HEPARIN SODIUM (PORCINE) 1000 UNIT/ML IJ SOLN
INTRAMUSCULAR | Status: DC | PRN
  Administered 2015-06-13: 3000 [IU] via INTRAVENOUS

## 2015-06-13 MED ORDER — METHYLPREDNISOLONE SODIUM SUCC 125 MG IJ SOLR
INTRAMUSCULAR | Status: AC
  Administered 2015-06-13: 125 mg
  Filled 2015-06-13: qty 2

## 2015-06-13 MED ORDER — FAMOTIDINE 20 MG PO TABS
20.0000 mg | ORAL_TABLET | Freq: Once | ORAL | Status: AC
  Administered 2015-06-13: 20 mg via ORAL

## 2015-06-13 MED ORDER — HEPARIN (PORCINE) IN NACL 2-0.9 UNIT/ML-% IJ SOLN
INTRAMUSCULAR | Status: AC
  Filled 2015-06-13: qty 1000

## 2015-06-13 MED ORDER — ONDANSETRON HCL 4 MG/2ML IJ SOLN: 4.0000 mg | INTRAMUSCULAR | Status: DC | PRN

## 2015-06-13 MED ORDER — MIDAZOLAM HCL 5 MG/5ML IJ SOLN
INTRAMUSCULAR | Status: AC
  Filled 2015-06-13: qty 5

## 2015-06-13 SURGICAL SUPPLY — 20 items
BALLN DORADO 8X40X80 (BALLOONS) ×3
BALLN LUTONIX DCB 7X40X130 (BALLOONS) ×3
BALLN LUTONIX DCB 7X60X130 (BALLOONS) ×3
BALLOON DORADO 8X40X80 (BALLOONS) ×1 IMPLANT
BALLOON LUTONIX DCB 7X40X130 (BALLOONS) ×1 IMPLANT
BALLOON LUTONIX DCB 7X60X130 (BALLOONS) ×1 IMPLANT
CATH KUMPE (CATHETERS) ×2
CATH RIM 5FR (CATHETERS) ×3 IMPLANT
CATH SLIP 5FR .038X65 KMP (CATHETERS) ×1
CATH SLIP 5FR 0.38 X 40 KMP (CATHETERS) ×1 IMPLANT
CATH VC FLUSH 5FX65 8S (CATHETERS) ×3 IMPLANT
DRAPE BRACHIAL (DRAPES) ×3 IMPLANT
GUIDEWIRE ANGLED .035 180CM (WIRE) ×3 IMPLANT
PACK ANGIOGRAPHY (CUSTOM PROCEDURE TRAY) ×3 IMPLANT
SET INTRO CAPELLA COAXIAL (SET/KITS/TRAYS/PACK) ×3 IMPLANT
SHEATH BRITE TIP 6FRX5.5 (SHEATH) ×3 IMPLANT
SHEATH BRITE TIP 7FRX5.5 (SHEATH) ×3 IMPLANT
TOWEL OR 17X26 4PK STRL BLUE (TOWEL DISPOSABLE) ×3 IMPLANT
WIRE MAGIC TOR.035 180C (WIRE) ×3 IMPLANT
WIRE MAGIC TORQUE 260C (WIRE) ×3 IMPLANT

## 2015-06-13 NOTE — H&P (Signed)
[facility] History & Physical Update  The patient was interviewed and re-examined.  The patient's previous History and Physical has been reviewed and is unchanged.  There is no change in the plan of care.  Schnier, Dolores Lory, MD  06/13/2015, 12:00 PM

## 2015-06-13 NOTE — Progress Notes (Signed)
Clarification; 1105 to 1123 cupid documentation and all medication administration done by [email or web address],rn and [place] d, rn

## 2015-06-16 ENCOUNTER — Encounter: Payer: Self-pay | Admitting: Vascular Surgery

## 2015-09-02 ENCOUNTER — Emergency Department: Payer: Medicare Other

## 2015-09-02 ENCOUNTER — Emergency Department
Admission: EM | Admit: 2015-09-02 | Discharge: 2015-09-02 | Disposition: A | Discharge status: Home / Self Care | Payer: Medicare Other | Attending: Emergency Medicine | Admitting: Emergency Medicine

## 2015-09-02 ENCOUNTER — Encounter: Payer: Self-pay | Admitting: Emergency Medicine

## 2015-09-02 DIAGNOSIS — I120 Hypertensive chronic kidney disease with stage 5 chronic kidney disease or end stage renal disease: Secondary | ICD-10-CM | POA: Diagnosis not present

## 2015-09-02 DIAGNOSIS — Z79899 Other long term (current) drug therapy: Secondary | ICD-10-CM | POA: Insufficient documentation

## 2015-09-02 DIAGNOSIS — Z87891 Personal history of nicotine dependence: Secondary | ICD-10-CM | POA: Insufficient documentation

## 2015-09-02 DIAGNOSIS — I4891 Unspecified atrial fibrillation: Secondary | ICD-10-CM | POA: Insufficient documentation

## 2015-09-02 DIAGNOSIS — Z992 Dependence on renal dialysis: Secondary | ICD-10-CM | POA: Diagnosis not present

## 2015-09-02 DIAGNOSIS — N186 End stage renal disease: Secondary | ICD-10-CM | POA: Insufficient documentation

## 2015-09-02 DIAGNOSIS — R000 Tachycardia, unspecified: Secondary | ICD-10-CM

## 2015-09-02 LAB — CBC WITH DIFFERENTIAL/PLATELET
BASOS ABS: 0.1 10*3/uL (ref 0–0.1)
BASOS PCT: 2 %
Eosinophils Absolute: 0.1 10*3/uL (ref 0–0.7)
Eosinophils Relative: 2 %
HCT: 33.9 % — ABNORMAL LOW (ref 35.0–47.0)
Hemoglobin: 10.6 g/dL — ABNORMAL LOW (ref 12.0–16.0)
LYMPHS PCT: 16 %
Lymphs Abs: 0.6 10*3/uL — ABNORMAL LOW (ref 1.0–3.6)
MCH: 28.5 pg (ref 26.0–34.0)
MCHC: 31.4 g/dL — AB (ref 32.0–36.0)
MCV: 91 fL (ref 80.0–100.0)
MONO ABS: 0.3 10*3/uL (ref 0.2–0.9)
Monocytes Relative: 8 %
NEUTROS ABS: 2.8 10*3/uL (ref 1.4–6.5)
NEUTROS PCT: 72 %
PLATELETS: 129 10*3/uL — AB (ref 150–440)
RBC: 3.73 MIL/uL — AB (ref 3.80–5.20)
RDW: 20.6 % — ABNORMAL HIGH (ref 11.5–14.5)
WBC: 3.9 10*3/uL (ref 3.6–11.0)

## 2015-09-02 LAB — BASIC METABOLIC PANEL
Anion gap: 13 (ref 5–15)
BUN: 14 mg/dL (ref 6–20)
CHLORIDE: 88 mmol/L — AB (ref 101–111)
CO2: 29 mmol/L (ref 22–32)
CREATININE: 3.55 mg/dL — AB (ref 0.44–1.00)
Calcium: 8 mg/dL — ABNORMAL LOW (ref 8.9–10.3)
GFR calc Af Amer: 13 mL/min — ABNORMAL LOW (ref >60)
GFR calc non Af Amer: 12 mL/min — ABNORMAL LOW (ref >60)
GLUCOSE: 87 mg/dL (ref 65–99)
Potassium: 3.7 mmol/L (ref 3.5–5.1)
SODIUM: 130 mmol/L — AB (ref 135–145)

## 2015-09-02 LAB — TROPONIN I

## 2015-09-02 MED ORDER — METOPROLOL TARTRATE 25 MG PO TABS
25.0000 mg | ORAL_TABLET | Freq: Once | ORAL | Status: AC
  Administered 2015-09-02: 25 mg via ORAL
  Filled 2015-09-02: qty 1

## 2015-09-02 MED ORDER — ONDANSETRON 4 MG PO TBDP
ORAL_TABLET | ORAL | Status: AC
  Administered 2015-09-02: 4 mg via ORAL
  Filled 2015-09-02: qty 1

## 2015-09-02 MED ORDER — ONDANSETRON 4 MG PO TBDP
4.0000 mg | ORAL_TABLET | Freq: Once | ORAL | Status: AC
  Administered 2015-09-02 (×2): 4 mg via ORAL

## 2015-09-02 MED ORDER — METOPROLOL TARTRATE 25 MG PO TABS: 25.0000 mg | ORAL_TABLET | Freq: Every day | ORAL | Status: DC

## 2015-09-02 MED ORDER — IPRATROPIUM-ALBUTEROL 0.5-2.5 (3) MG/3ML IN SOLN
RESPIRATORY_TRACT | Status: AC
  Filled 2015-09-02: qty 3

## 2015-09-02 NOTE — ED Notes (Signed)
MD at bedside for eval.

## 2015-09-02 NOTE — ED Provider Notes (Signed)
[facility] Emergency Department Provider Note    ____________________________________________  Time seen: 1915  I have reviewed the triage vital signs and the nursing notes.   HISTORY  Chief Complaint Tachycardia   History limited by: Not Limited   HPI [name] is a 74 y.o. female who presents to the emergency department today because of concerns for fast heart rate. The patient states that her home health nurse came to visit her today and noted that her heart rate was elevated. Home health nurse advised that the patient be evaluated in the emergency department. The patient herself states that she felt like her normal self today. She did not notice any chest pain, shortness breath, nausea, fevers, recent vomiting, diarrhea. The patient is on dialysis. She states that she got her full and normal course of dialysis today. She states they brought her down to her dry weight.     Past Medical History  Diagnosis Date  . Hypertension   . Hyperlipidemia   . Thyroid disease     Hypothyroidism  . ESRD (end stage renal disease) on dialysis   . Autoimmune thrombocytopenia   . COPD (chronic obstructive pulmonary disease)   . Diverticulitis 2013 sept.      hospitalized  at [facility].  . Anginal pain   . Hypothyroidism   . Shortness of breath   . Pneumonia   . Chronic diastolic CHF (congestive heart failure)   . GERD (gastroesophageal reflux disease)   . Arthritis     Patient Active Problem List   Diagnosis Date Noted  . Sepsis 05/01/2015  . HCAP (healthcare-associated pneumonia) 05/01/2015  . Hypoglycemia 05/01/2015  . Elevated troponin 05/01/2015  . Diastolic congestive heart failure 05/01/2015  . Acute respiratory failure 05/01/2015  . Venous stenosis of left upper extremity 05/11/2013  . PSVT (paroxysmal supraventricular tachycardia) 04/22/2013  . Diverticulitis of sigmoid colon 04/22/2013  . Clostridium difficile colitis 04/22/2013  .  Bacteremia due to Staphylococcus 04/22/2013  . Anemia 09/17/2012  . ESRD (end stage renal disease) on dialysis 09/17/2012  . HTN (hypertension) 09/17/2012  . COPD (chronic obstructive pulmonary disease) 09/17/2012  . Hypothyroidism 09/17/2012  . Nausea and vomiting 09/17/2012    Past Surgical History  Procedure Laterality Date  . Av fistula placement, brachiocephalic  60/73/7106    right arm  . Abdominal hysterectomy    . Colonoscopy  Dec 2013    colon cancer .. surgery  to be 2014  . Shuntogram Left 05/28/2013    Procedure: SHUNTOGRAM;  Surgeon: Conrad Ontonagon, MD;  Location: [facility] CATH LAB;  Service: Cardiovascular;  Laterality: Left;  . Peripheral vascular catheterization Left 06/13/2015    Procedure: A/V Shuntogram/Fistulagram;  Surgeon: Katha Cabal, MD;  Location: [facility] CV LAB;  Service: Cardiovascular;  Laterality: Left;  . Peripheral vascular catheterization Left 06/13/2015    Procedure: A/V Shunt Intervention;  Surgeon: Katha Cabal, MD;  Location: [place] CV LAB;  Service: Cardiovascular;  Laterality: Left;    Current Outpatient Rx  Name  Route  Sig  Dispense  Refill  . acetaminophen (TYLENOL) 500 MG tablet   Oral   Take 1,000 mg by mouth 2 (two) times daily as needed for headache.          . albuterol-ipratropium (COMBIVENT) 18-103 MCG/ACT inhaler   Inhalation   Inhale 2 puffs into the lungs 2 (two) times daily as needed for wheezing or shortness of breath.          Marland Kitchen  b complex-vitamin c-folic acid (NEPHRO-VITE) 0.8 MG TABS tablet   Oral   Take 1 tablet by mouth at bedtime.   30 tablet   1   . calcium acetate (PHOSLO) 667 MG capsule   Oral   Take 667 mg by mouth 3 (three) times daily with meals.         . cinacalcet (SENSIPAR) 30 MG tablet   Oral   Take 30 mg by mouth every evening.          . diphenhydrAMINE (BENADRYL) 25 mg capsule   Oral   Take 25 mg by mouth 3 (three) times daily as needed for itching or sleep.          Marland Kitchen  guaiFENesin-dextromethorphan (ROBITUSSIN DM) 100-10 MG/5ML syrup   Oral   Take 5 mLs by mouth every 4 (four) hours as needed for cough.   118 mL   0   . isosorbide mononitrate (IMDUR) 60 MG 24 hr tablet   Oral   Take 60 mg by mouth daily.          Marland Kitchen levofloxacin (LEVAQUIN) 500 MG tablet   Oral   Take 1 tablet (500 mg total) by mouth every other day. Patient not taking: Reported on 06/13/2015   10 tablet   0   . levothyroxine (SYNTHROID, LEVOTHROID) 125 MCG tablet   Oral   Take 125 mcg by mouth daily.          . metoCLOPramide (REGLAN) 5 MG tablet   Oral   Take 5 mg by mouth 2 (two) times daily before a meal.         . ondansetron (ZOFRAN) 4 MG tablet   Oral   Take 4 mg by mouth every 8 (eight) hours as needed for nausea or vomiting.         . pantoprazole (PROTONIX) 40 MG tablet   Oral   Take 40 mg by mouth 2 (two) times daily.         . sevelamer (RENVELA) 800 MG tablet   Oral   Take 1,600-2,400 mg by mouth See admin instructions. Pt takes three tablets before meals and two tablets with snacks.           Allergies Shellfish-derived products  Family History  Problem Relation Age of Onset  . Heart disease Mother   . Cancer Father   . Kidney disease Brother     Social History Social History  Substance Use Topics  . Smoking status: Former Smoker    Types: Cigarettes    Quit date: 06/12/1998  . Smokeless tobacco: Never Used  . Alcohol Use: No    Review of Systems  Constitutional: Negative for fever. Cardiovascular: Negative for chest pain. Respiratory: Negative for shortness of breath. Gastrointestinal: Negative for abdominal pain, vomiting and diarrhea. Genitourinary: Does not produce urine Musculoskeletal: Negative for back pain. Skin: Negative for rash. Neurological: Negative for headaches, focal weakness or numbness.  10-point ROS otherwise negative.  ____________________________________________   PHYSICAL EXAM:  VITAL SIGNS: ED  Triage Vitals  Enc Vitals Group     BP 09/02/15 1710 97/83 mmHg     Pulse Rate 09/02/15 1709 98     Resp 09/02/15 1709 20     Temp 09/02/15 1709 97.7 F (36.5 C)     Temp src --      SpO2 --      Weight 09/02/15 1709 163 lb 2.3 oz (74 kg)     Height 09/02/15 1709 5\' 4"  (  1.626 m)   Constitutional: Alert and oriented. Well appearing and in no distress. Eyes: Conjunctivae are normal. PERRL. Normal extraocular movements. ENT   Head: Normocephalic and atraumatic.   Nose: No congestion/rhinnorhea.   Mouth/Throat: Mucous membranes are moist.   Neck: No stridor. Hematological/Lymphatic/Immunilogical: No cervical lymphadenopathy. Cardiovascular: Irregularly irregular rhythm. Tachycardic.  No murmurs, rubs, or gallops. Respiratory: Normal respiratory effort without tachypnea nor retractions. Breath sounds are clear and equal bilaterally. No wheezes/rales/rhonchi. Gastrointestinal: Soft and nontender. No distention.  Genitourinary: Deferred Musculoskeletal: Normal range of motion in all extremities. No joint effusions.  No lower extremity tenderness nor edema. Neurologic:  Normal speech and language. No gross focal neurologic deficits are appreciated. Speech is normal.  Skin:  Skin is warm, dry and intact. No rash noted. Psychiatric: Mood and affect are normal. Speech and behavior are normal. Patient exhibits appropriate insight and judgment.  ____________________________________________    LABS (pertinent positives/negatives)  Labs Reviewed  CBC WITH DIFFERENTIAL/PLATELET - Abnormal; Notable for the following:    RBC 3.73 (*)    Hemoglobin 10.6 (*)    HCT 33.9 (*)    MCHC 31.4 (*)    RDW 20.6 (*)    Platelets 129 (*)    Lymphs Abs 0.6 (*)    All other components within normal limits  BASIC METABOLIC PANEL - Abnormal; Notable for the following:    Sodium 130 (*)    Chloride 88 (*)    Creatinine, Ser 3.55 (*)    Calcium 8.0 (*)    GFR calc non Af Amer 12 (*)    GFR  calc Af Amer 13 (*)    All other components within normal limits  TROPONIN I  URINALYSIS COMPLETEWITH MICROSCOPIC ([facility] ONLY)     ____________________________________________   EKG  I, Nance Pear, attending physician, personally viewed and interpreted this EKG  EKG Time: 1717 Rate: 115 Rhythm: afib w/ rvr Axis: normal Intervals: qtc 376 QRS: narrow ST changes: no st elevation  ____________________________________________    RADIOLOGY  CXR IMPRESSION: Moderate background interstitial change and chronic changes on the right with no acute findings  ____________________________________________   PROCEDURES  Procedure(s) performed: None  Critical Care performed: No  ____________________________________________   INITIAL IMPRESSION / ASSESSMENT AND PLAN / ED COURSE  Pertinent labs & imaging results that were available during my care of the patient were reviewed by me and considered in my medical decision making (see chart for details).  Patient presented to the emergency department today  with concerns for fast heart rate. EKG here does show A. fib. Patient unaware of that diagnosis however on chart review patient appeared to be in A. fib on EKGs obtained during her hospitalization in May. Does not appear that she is on any anticoagulation or rate control medications at this point. Talk to Dr. Nehemiah Massed who will be able to see the patient in the clinic tomorrow. I will put the patient on Lopressor and have patient follow-up with cardiology.  ____________________________________________   FINAL CLINICAL IMPRESSION(S) / ED DIAGNOSES  Final diagnoses:  Atrial fibrillation, unspecified  Tachycardia     Nance Pear, MD 09/02/15 2309

## 2015-09-02 NOTE — ED Notes (Signed)
Patient transported to X-ray 

## 2015-09-02 NOTE — ED Notes (Signed)
Pt states home health RN visited her at home and sent her to ER due to high pulse, pt states it was up to 135, pt arrives alert and oriented in no distress, home 02, family at bedside, pt denies any chest pain, dialysis tue/thur/sat

## 2015-09-02 NOTE — Discharge Instructions (Signed)
Please seek medical attention for any high fevers, chest pain, shortness of breath, change in behavior, persistent vomiting, bloody stool or any other new or concerning symptoms.   Atrial Fibrillation Atrial fibrillation is a type of irregular heart rhythm (arrhythmia). During atrial fibrillation, the upper chambers of the heart (atria) quiver continuously in a chaotic pattern. This causes an irregular and often rapid heart rate.  Atrial fibrillation is the result of the heart becoming overloaded with disorganized signals that tell it to beat. These signals are normally released one at a time by a part of the right atrium called the sinoatrial node. They then travel from the atria to the lower chambers of the heart (ventricles), causing the atria and ventricles to contract and pump blood as they pass. In atrial fibrillation, parts of the atria outside of the sinoatrial node also release these signals. This results in two problems. First, the atria receive so many signals that they do not have time to fully contract. Second, the ventricles, which can only receive one signal at a time, beat irregularly and out of rhythm with the atria.  There are three types of atrial fibrillation:   Paroxysmal. Paroxysmal atrial fibrillation starts suddenly and stops on its own within a week.  Persistent. Persistent atrial fibrillation lasts for more than a week. It may stop on its own or with treatment.  Permanent. Permanent atrial fibrillation does not go away. Episodes of atrial fibrillation may lead to permanent atrial fibrillation. Atrial fibrillation can prevent your heart from pumping blood normally. It increases your risk of stroke and can lead to heart failure.  CAUSES   Heart conditions, including a heart attack, heart failure, coronary artery disease, and heart valve conditions.   Inflammation of the sac that surrounds the heart (pericarditis).  Blockage of an artery in the lungs (pulmonary  embolism).  Pneumonia or other infections.  Chronic lung disease.  Thyroid problems, especially if the thyroid is overactive (hyperthyroidism).  Caffeine, excessive alcohol use, and use of some illegal drugs.   Use of some medicines, including certain decongestants and diet pills.  Heart surgery.   Birth defects.  Sometimes, no cause can be found. When this happens, the atrial fibrillation is called lone atrial fibrillation. The risk of complications from atrial fibrillation increases if you have lone atrial fibrillation and you are age 75 years or older. RISK FACTORS  Heart failure.  Coronary artery disease.  Diabetes mellitus.   High blood pressure (hypertension).   Obesity.   Other arrhythmias.   Increased age. SIGNS AND SYMPTOMS   A feeling that your heart is beating rapidly or irregularly.   A feeling of discomfort or pain in your chest.   Shortness of breath.   Sudden light-headedness or weakness.   Getting tired easily when exercising.   Urinating more often than normal (mainly when atrial fibrillation first begins).  In paroxysmal atrial fibrillation, symptoms may start and suddenly stop. DIAGNOSIS  Your health care provider may be able to detect atrial fibrillation when taking your pulse. Your health care provider may have you take a test called an ambulatory electrocardiogram (ECG). An ECG records your heartbeat patterns over a 24-hour period. You may also have other tests, such as:  Transthoracic echocardiogram (TTE). During echocardiography, sound waves are used to evaluate how blood flows through your heart.  Transesophageal echocardiogram (TEE).  Stress test. There is more than one type of stress test. If a stress test is needed, ask your health care provider about which type  is best for you.  Chest X-ray exam.  Blood tests.  Computed tomography (CT). TREATMENT  Treatment may include:  Treating any underlying conditions. For  example, if you have an overactive thyroid, treating the condition may correct atrial fibrillation.  Taking medicine. Medicines may be given to control a rapid heart rate or to prevent blood clots, heart failure, or a stroke.  Having a procedure to correct the rhythm of the heart:  Electrical cardioversion. During electrical cardioversion, a controlled, low-energy shock is delivered to the heart through your skin. If you have chest pain, very low blood pressure, or sudden heart failure, this procedure may need to be done as an emergency.  Catheter ablation. During this procedure, heart tissues that send the signals that cause atrial fibrillation are destroyed.  Surgical ablation. During this surgery, thin lines of heart tissue that carry the abnormal signals are destroyed. This procedure can either be an open-heart surgery or a minimally invasive surgery. With the minimally invasive surgery, small cuts are made to access the heart instead of a large opening.  Pulmonary venous isolation. During this surgery, tissue around the veins that carry blood from the lungs (pulmonary veins) is destroyed. This tissue is thought to carry the abnormal signals. HOME CARE INSTRUCTIONS   Take medicines only as directed by your health care provider. Some medicines can make atrial fibrillation worse or recur.  If blood thinners were prescribed by your health care provider, take them exactly as directed. Too much blood-thinning medicine can cause bleeding. If you take too little, you will not have the needed protection against stroke and other problems.  Perform blood tests at home if directed by your health care provider. Perform blood tests exactly as directed.  Quit smoking if you smoke.  Do not drink alcohol.  Do not drink caffeinated beverages such as coffee, soda, and some teas. You may drink decaffeinated coffee, soda, or tea.   Maintain a healthy weight.Do not use diet pills unless your health care  provider approves. They may make heart problems worse.   Follow diet instructions as directed by your health care provider.  Exercise regularly as directed by your health care provider.  Keep all follow-up visits as directed by your health care provider. This is important. PREVENTION  The following substances can cause atrial fibrillation to recur:   Caffeinated beverages.  Alcohol.  Certain medicines, especially those used for breathing problems.  Certain herbs and herbal medicines, such as those containing ephedra or ginseng.  Illegal drugs, such as cocaine and amphetamines. Sometimes medicines are given to prevent atrial fibrillation from recurring. Proper treatment of any underlying condition is also important in helping prevent recurrence.  SEEK MEDICAL CARE IF:  You notice a change in the rate, rhythm, or strength of your heartbeat.  You suddenly begin urinating more frequently.  You tire more easily when exerting yourself or exercising. SEEK IMMEDIATE MEDICAL CARE IF:   You have chest pain, abdominal pain, sweating, or weakness.  You feel nauseous.  You have shortness of breath.  You suddenly have swollen feet and ankles.  You feel dizzy.  Your face or limbs feel numb or weak.  You have a change in your vision or speech. MAKE SURE YOU:   Understand these instructions.  Will watch your condition.  Will get help right away if you are not doing well or get worse. Document Released: 12/13/2005 Document Revised: 04/29/2014 Document Reviewed: 01/23/2013 [facility] Patient Information 2015 [place], [place]. This information is not intended to  replace advice given to you by your health care provider. Make sure you discuss any questions you have with your health care provider. ° °

## 2015-09-02 NOTE — ED Notes (Signed)
Reports being a little more sob than usual.  Home health nurse told her her heartrate was high and to go to ER

## 2015-11-10 ENCOUNTER — Other Ambulatory Visit: Payer: Self-pay | Admitting: Vascular Surgery

## 2015-11-12 ENCOUNTER — Encounter: Payer: Self-pay | Admitting: *Deleted

## 2015-11-12 ENCOUNTER — Encounter
Admission: RE | Disposition: A | Discharge status: Home / Self Care | Payer: Self-pay | Source: Ambulatory Visit | Attending: Vascular Surgery

## 2015-11-12 ENCOUNTER — Ambulatory Visit
Admission: RE | Admit: 2015-11-12 | Discharge: 2015-11-12 | Disposition: A | Discharge status: Home / Self Care | Payer: Medicare Other | Source: Ambulatory Visit | Attending: Vascular Surgery | Admitting: Vascular Surgery

## 2015-11-12 DIAGNOSIS — Z87891 Personal history of nicotine dependence: Secondary | ICD-10-CM | POA: Insufficient documentation

## 2015-11-12 DIAGNOSIS — K219 Gastro-esophageal reflux disease without esophagitis: Secondary | ICD-10-CM | POA: Insufficient documentation

## 2015-11-12 DIAGNOSIS — J449 Chronic obstructive pulmonary disease, unspecified: Secondary | ICD-10-CM | POA: Diagnosis not present

## 2015-11-12 DIAGNOSIS — I5032 Chronic diastolic (congestive) heart failure: Secondary | ICD-10-CM | POA: Diagnosis not present

## 2015-11-12 DIAGNOSIS — E039 Hypothyroidism, unspecified: Secondary | ICD-10-CM | POA: Insufficient documentation

## 2015-11-12 DIAGNOSIS — I2510 Atherosclerotic heart disease of native coronary artery without angina pectoris: Secondary | ICD-10-CM | POA: Insufficient documentation

## 2015-11-12 DIAGNOSIS — T82858A Stenosis of vascular prosthetic devices, implants and grafts, initial encounter: Secondary | ICD-10-CM | POA: Diagnosis present

## 2015-11-12 DIAGNOSIS — E785 Hyperlipidemia, unspecified: Secondary | ICD-10-CM | POA: Diagnosis not present

## 2015-11-12 DIAGNOSIS — I120 Hypertensive chronic kidney disease with stage 5 chronic kidney disease or end stage renal disease: Secondary | ICD-10-CM | POA: Diagnosis not present

## 2015-11-12 DIAGNOSIS — N186 End stage renal disease: Secondary | ICD-10-CM | POA: Insufficient documentation

## 2015-11-12 DIAGNOSIS — Z992 Dependence on renal dialysis: Secondary | ICD-10-CM | POA: Diagnosis not present

## 2015-11-12 HISTORY — PX: PERIPHERAL VASCULAR CATHETERIZATION: SHX172C

## 2015-11-12 LAB — POTASSIUM (ARMC VASCULAR LAB ONLY): POTASSIUM (ARMC VASCULAR LAB): 3.9 (ref 3.5–5.1)

## 2015-11-12 SURGERY — A/V SHUNTOGRAM/FISTULAGRAM
Anesthesia: Moderate Sedation

## 2015-11-12 MED ORDER — MIDAZOLAM HCL 2 MG/2ML IJ SOLN
INTRAMUSCULAR | Status: DC | PRN
  Administered 2015-11-12 (×3): 1 mg via INTRAVENOUS

## 2015-11-12 MED ORDER — FENTANYL CITRATE (PF) 100 MCG/2ML IJ SOLN
INTRAMUSCULAR | Status: AC
  Filled 2015-11-12: qty 2

## 2015-11-12 MED ORDER — IOHEXOL 300 MG/ML  SOLN
INTRAMUSCULAR | Status: DC | PRN
  Administered 2015-11-12: 55 mL via INTRAVENOUS

## 2015-11-12 MED ORDER — HEPARIN (PORCINE) IN NACL 2-0.9 UNIT/ML-% IJ SOLN
INTRAMUSCULAR | Status: AC
  Filled 2015-11-12: qty 1000

## 2015-11-12 MED ORDER — HEPARIN SODIUM (PORCINE) 1000 UNIT/ML IJ SOLN
INTRAMUSCULAR | Status: DC | PRN
  Administered 2015-11-12: 3000 [IU] via INTRAVENOUS

## 2015-11-12 MED ORDER — LIDOCAINE HCL (PF) 1 % IJ SOLN
INTRAMUSCULAR | Status: AC
  Filled 2015-11-12: qty 10

## 2015-11-12 MED ORDER — ACETAMINOPHEN 325 MG RE SUPP: 325.0000 mg | RECTAL | Status: DC | PRN

## 2015-11-12 MED ORDER — ONDANSETRON HCL 4 MG/2ML IJ SOLN: 4.0000 mg | Freq: Four times a day (QID) | INTRAMUSCULAR | Status: DC | PRN

## 2015-11-12 MED ORDER — FAMOTIDINE 20 MG PO TABS
20.0000 mg | ORAL_TABLET | Freq: Once | ORAL | Status: AC
  Administered 2015-11-12: 20 mg via ORAL

## 2015-11-12 MED ORDER — IPRATROPIUM-ALBUTEROL 0.5-2.5 (3) MG/3ML IN SOLN
RESPIRATORY_TRACT | Status: AC
  Filled 2015-11-12: qty 3

## 2015-11-12 MED ORDER — MIDAZOLAM HCL 5 MG/5ML IJ SOLN
INTRAMUSCULAR | Status: AC
  Filled 2015-11-12: qty 5

## 2015-11-12 MED ORDER — METHYLPREDNISOLONE SODIUM SUCC 125 MG IJ SOLR
125.0000 mg | Freq: Once | INTRAMUSCULAR | Status: AC
  Administered 2015-11-12: 125 mg via INTRAVENOUS

## 2015-11-12 MED ORDER — ACETAMINOPHEN 325 MG PO TABS: 325.0000 mg | ORAL_TABLET | ORAL | Status: DC | PRN

## 2015-11-12 MED ORDER — OXYCODONE HCL 5 MG PO TABS: 5.0000 mg | ORAL_TABLET | ORAL | Status: DC | PRN

## 2015-11-12 MED ORDER — LIDOCAINE HCL (PF) 1 % IJ SOLN
INTRAMUSCULAR | Status: DC | PRN
  Administered 2015-11-12: 2 mL via INTRADERMAL
  Administered 2015-11-12: 5 mL via INTRADERMAL

## 2015-11-12 MED ORDER — IPRATROPIUM-ALBUTEROL 0.5-2.5 (3) MG/3ML IN SOLN
3.0000 mL | Freq: Four times a day (QID) | RESPIRATORY_TRACT | Status: DC
  Administered 2015-11-12: 3 mL via RESPIRATORY_TRACT

## 2015-11-12 MED ORDER — DEXTROSE 5 % IV SOLN
1.5000 g | INTRAVENOUS | Status: AC
  Administered 2015-11-12: 1.5 g via INTRAVENOUS
  Filled 2015-11-12: qty 1.5

## 2015-11-12 MED ORDER — METHYLPREDNISOLONE SODIUM SUCC 125 MG IJ SOLR
INTRAMUSCULAR | Status: AC
  Filled 2015-11-12: qty 2

## 2015-11-12 MED ORDER — FAMOTIDINE 20 MG PO TABS
ORAL_TABLET | ORAL | Status: AC
  Filled 2015-11-12: qty 1

## 2015-11-12 MED ORDER — SODIUM CHLORIDE 0.9 % IV SOLN
INTRAVENOUS | Status: DC
  Administered 2015-11-12: 09:00:00 via INTRAVENOUS

## 2015-11-12 MED ORDER — HEPARIN SODIUM (PORCINE) 1000 UNIT/ML IJ SOLN
INTRAMUSCULAR | Status: AC
  Filled 2015-11-12: qty 1

## 2015-11-12 MED ORDER — HYDROMORPHONE HCL 1 MG/ML IJ SOLN: 0.5000 mg | INTRAMUSCULAR | Status: DC | PRN

## 2015-11-12 MED ORDER — ALUM & MAG HYDROXIDE-SIMETH 200-200-20 MG/5ML PO SUSP: 15.0000 mL | ORAL | Status: DC | PRN

## 2015-11-12 SURGICAL SUPPLY — 16 items
BALLN DORADO 10X80X80 (BALLOONS) ×3
BALLN DORADO 8X60X80 (BALLOONS) ×3
BALLOON DORADO 10X80X80 (BALLOONS) IMPLANT
BALLOON DORADO 8X60X80 (BALLOONS) IMPLANT
CATH RIM 65CM (CATHETERS) ×2 IMPLANT
DEVICE PRESTO INFLATION (MISCELLANEOUS) ×2 IMPLANT
DEVICE TORQUE (MISCELLANEOUS) ×2 IMPLANT
DRAPE BRACHIAL (DRAPES) ×2 IMPLANT
GLIDEWIRE STIFF .35X180X3 HYDR (WIRE) ×2 IMPLANT
PACK ANGIOGRAPHY (CUSTOM PROCEDURE TRAY) ×2 IMPLANT
SET INTRO CAPELLA COAXIAL (SET/KITS/TRAYS/PACK) ×2 IMPLANT
SHEATH BRITE TIP 6FRX5.5 (SHEATH) ×2 IMPLANT
SHEATH BRITE TIP 7FRX5.5 (SHEATH) ×2 IMPLANT
SUT MNCRL AB 4-0 PS2 18 (SUTURE) ×2 IMPLANT
TOWEL OR 17X26 4PK STRL BLUE (TOWEL DISPOSABLE) ×2 IMPLANT
WIRE MAGIC TORQUE 260C (WIRE) ×2 IMPLANT

## 2015-11-12 NOTE — Discharge Instructions (Signed)

## 2015-11-12 NOTE — H&P (Signed)
[facility] Admission History & Physical  MRN : [id number]  [name] is a 74 y.o. (July 27, 1941) female who presents with chief complaint of fistula is not working.  History of Present Illness: Patient is sent by her dialysis center secondary to difficulties with her fistula. There expand prolonged bleeding as well as low flows. There also noted increasing difficulty with cannulation area did  Patient denies fever chills while on dialysis. She denies arm pain. No symptoms consistent with steal.  She has a left arm basilic transposition and last underwent angiography and intervention in June 2016. At that point a high-grade stenosis was noted at the arterial anastomosis as well as the leading edge of a previously placed stent.  Current Facility-Administered Medications  Medication Dose Route Frequency Provider Last Rate Last Dose  . 0.9 %  sodium chloride infusion   Intravenous Continuous Janalyn Harder Stegmayer, PA-C 10 mL/hr at 11/12/15 0840    . cefUROXime (ZINACEF) 1.5 g in dextrose 5 % 50 mL IVPB  1.5 g Intravenous 30 min Pre-Op Kimberly A Stegmayer, PA-C      . famotidine (PEPCID) 20 MG tablet           . methylPREDNISolone sodium succinate (SOLU-MEDROL) 125 mg/2 mL injection             Past Medical History  Diagnosis Date  . Hypertension   . Hyperlipidemia   . Thyroid disease     Hypothyroidism  . ESRD (end stage renal disease) on dialysis ([place])   . 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 COPD (chronic obstructive pulmonary disease) ([place])   . Diverticulitis 2013 sept.      hospitalized  at [facility].  . Anginal pain ([place])   . Hypothyroidism   . Shortness of breath   . Pneumonia   . Chronic diastolic CHF (congestive heart failure) ([place])   . GERD (gastroesophageal reflux disease)   . Arthritis     Past Surgical History  Procedure Laterality Date  . Av fistula placement, brachiocephalic  [id number]    right arm  . Abdominal hysterectomy    .  Colonoscopy  Dec 2013    colon cancer .. surgery  to be 2014  . Shuntogram Left 05/28/2013    Procedure: SHUNTOGRAM;  Surgeon: Conrad Marion, MD;  Location[facility] CATH LAB;  Service: Cardiovascular;  Laterality: Left;  . Peripheral vascular catheterization Left 06/13/2015    Procedure: A/V Shuntogram/Fistulagram;  Surgeon: Katha Cabal, MD;  Location: [facility];  Service: Cardiovascular;  Laterality: Left;  . Peripheral vascular catheterization Left 06/13/2015    Procedure: A/V Shunt Intervention;  Surgeon: Katha Cabal, MD;  Location: [facility];  Service: Cardiovascular;  Laterality: Left;  . Colon surgery      Social History Social History  Substance Use Topics  . Smoking status: Former Smoker -- 0.50 packs/day for 20 years    Types: Cigarettes    Quit date: 06/12/1998  . Smokeless tobacco: Never Used  . Alcohol Use: No    Family History Family History  Problem Relation Age of Onset  . Heart disease Mother   . Cancer Father   . Kidney disease Brother    no family history of bleeding clotting disorders porphyria or autoimmune disease  Allergies  Allergen Reactions  . Shellfish-Derived Products Anaphylaxis     REVIEW OF SYSTEMS (Negative unless checked)  Constitutional: [] Weight loss  [] Fever  [] Chills Cardiac: [] Chest pain   [] Chest pressure   []   Palpitations   [] Shortness of breath when laying flat   [] Shortness of breath at rest   [] Shortness of breath with exertion. Vascular:  [] Pain in legs with walking   [] Pain in legs at rest   [] Pain in legs when laying flat   [] Claudication   [] Pain in feet when walking  [] Pain in feet at rest  [] Pain in feet when laying flat   [] History of DVT   [] Phlebitis   [] Swelling in legs   [] Varicose veins   [] Non-healing ulcers Pulmonary:   [] Uses home oxygen   [] Productive cough   [] Hemoptysis   [] Wheeze  [] COPD   [] Asthma Neurologic:  [] Dizziness  [] Blackouts   [] Seizures   [] History of stroke   [] History of TIA   [] Aphasia   [] Temporary blindness   [] Dysphagia   [] Weakness or numbness in arms   [] Weakness or numbness in legs Musculoskeletal:  [] Arthritis   [] Joint swelling   [] Joint pain   [] Low back pain Hematologic:  [] Easy bruising  [] Easy bleeding   [] Hypercoagulable state   [] Anemic  [] Hepatitis Gastrointestinal:  [] Blood in stool   [] Vomiting blood  [] Gastroesophageal reflux/heartburn   [] Difficulty swallowing. Genitourinary:  [] Chronic kidney disease   [] Difficult urination  [] Frequent urination  [] Burning with urination   [] Blood in urine Skin:  [] Rashes   [] Ulcers   [] Wounds Psychological:  [] History of anxiety   []  History of major depression.  Physical Examination  Filed Vitals:   11/12/15 0811  BP: 100/72  Temp: 98.1 F (36.7 C)  TempSrc: Oral  Height: 5\' 4"  (1.626 m)  Weight: 78.926 kg (174 lb)  SpO2: 98%   Body mass index is 29.85 kg/(m^2). Gen: WD/WN, NAD Head: [place]/AT, No temporalis wasting.  Ear/Nose/Throat: Hearing grossly intact, nares w/o erythema or drainage, oropharynx w/o Erythema/Exudate, Eyes: PERRLA, EOMI.  Neck: Supple, no nuchal rigidity.  No bruit or JVD.  Pulmonary:  Good air movement, clear to auscultation bilaterally, no increased work of respiration or use of accessory muscles  Cardiac: RRR, normal S1, S2, no Murmurs, rubs or gallops. Vascular: Left arm fistula is markedly pulsatile with diminished thrill more proximally. Has been well used is mildly aneurysmal but the skin is intact without evidence of breakdown cellulitis or other complication Vessel Right Left  Radial Palpable Palpable  Ulnar Palpable Palpable  Brachial Palpable Palpable  Carotid Palpable, without bruit Palpable, without bruit  Aorta Not palpable N/A  Femoral Palpable Palpable  Popliteal Palpable Palpable  PT Palpable Palpable  DP Palpable Palpable  Gastrointestinal: soft, non-tender/non-distended. No guarding/reflex. No masses, surgical incisions, or scars. Musculoskeletal: M/S 5/5  throughout.  No deformity or atrophy. Neurologic: CN 2-12 intact. Pain and light touch intact in extremities.  Symmetrical.  Speech is fluent. Motor exam as listed above. Psychiatric: Judgment intact, Mood & affect appropriate for pt's clinical situation. Dermatologic: No rashes or ulcers noted.  No cellulitis or open wounds. Lymph : No Cervical, Axillary, or Inguinal lymphadenopathy.   CBC Lab Results  Component Value Date   WBC 3.9 09/02/2015   HGB 10.6* 09/02/2015   HCT 33.9* 09/02/2015   MCV 91.0 09/02/2015   PLT 129* 09/02/2015    BMET    Component Value Date/Time   NA 130* 09/02/2015 1728   NA 137 12/31/2014 1022   K 3.7 09/02/2015 1728   K 4.5 12/31/2014 1022   CL 88* 09/02/2015 1728   CL 99 12/31/2014 1022   CO2 29 09/02/2015 1728   CO2 25 12/31/2014 1022   GLUCOSE 87  09/02/2015 1728   GLUCOSE 108* 12/31/2014 1022   BUN 14 09/02/2015 1728   BUN 133* 12/31/2014 1022   CREATININE 3.55* 09/02/2015 1728   CREATININE 10.56* 12/31/2014 1022   CALCIUM 8.0* 09/02/2015 1728   CALCIUM 8.9 12/31/2014 1022   GFRNONAA 12* 09/02/2015 1728   GFRNONAA 4* 12/31/2014 1022   GFRNONAA 4* 07/28/2014 0359   GFRAA 13* 09/02/2015 1728   GFRAA 5* 12/31/2014 1022   GFRAA 4* 07/28/2014 0359   CrCl cannot be calculated (Patient has no serum creatinine result on file.).  COAG Lab Results  Component Value Date   INR 1.66 05/01/2015   INR 1.2 12/07/2012   INR 1.0 12/05/2012    Radiology No results found.  Assessment/Plan 1.  Complication dialysis access with difficulty cannulating and poor dialysis runs:  Patient's left arm dialysis access is not functioning well. The patient will undergo angiography using interventional techniques restore patency to the fistula. Potassium will be drawn to ensure that it is an appropriate level prior to performing thrombectomy. Also given her shellfish allergy she will be prophylaxed for dye allergy 2.  End-stage renal disease requiring  hemodialysis:  Patient will continue dialysis therapy without further interruption if a successful thrombectomy is not achieved then catheter will be placed. Dialysis has already been arranged since the patient missed their previous session 3.  Hypertension:  Patient will continue medical management; nephrology is following no changes in oral medications. 4. COPD:  She will be maintained on her inhalers and her daily pulmonary medications. She will be given supplemental O2 during the procedure  5.  Coronary artery disease:  EKG will be monitored. Nitrates will be used if needed. The patient's oral cardiac medications will be continued.     Garek Schuneman, Meredith Lory, MD  11/12/2015 10:06 AM

## 2015-11-12 NOTE — Op Note (Signed)
OPERATIVE NOTE   PROCEDURE: 1. Contrast injection left arm basilic transposition 2. Percutaneous transluminal angioplasty venous portion to 10 mm  PRE-OPERATIVE DIAGNOSIS: Complication of dialysis access                                                       End Stage Renal Disease  POST-OPERATIVE DIAGNOSIS: same as above   SURGEON: Katha Cabal, M.D.  ANESTHESIA: Conscious Sedation   ESTIMATED BLOOD LOSS: minimal  FINDING(S): 1. Stricture within the previously placed flare stent  SPECIMEN(S):  None  CONTRAST: 55 cc  FLUOROSCOPY TIME: 6.3 minutes  INDICATIONS: PAXTYN WISDOM is a 74 y.o. female who  presents with malfunctioning left arm AV access.  The patient is scheduled for angiography with possible intervention of the AV access.  The patient is aware the risks include but are not limited to: bleeding, infection, thrombosis of the cannulated access, and possible anaphylactic reaction to the contrast.  The patient acknowledges if the access can not be salvaged a tunneled catheter will be needed and will be placed during this procedure.  The patient is aware of the risks of the procedure and elects to proceed with the angiogram and intervention.  DESCRIPTION: After full informed written consent was obtained, the patient was brought back to the Special Procedure suite and placed supine position.  Appropriate cardiopulmonary monitors were placed.  The left arm was prepped and draped in the standard fashion.  Appropriate timeout is called. The left basilic transposition  was cannulated with a micropuncture needle.  The microwire was advanced and the needle was exchanged for  a microsheath.  The J-wire was then advanced and a 6 Fr sheath inserted.  Hand injections were completed to image the access from the arterial anastomosis through the entire access.  The central venous structures were also imaged by hand injections.  Based on the images,  3000 units of heparin was given. A  Magic torque wire was then advanced into the central venous system. Initially an 8 x 6 [place] balloon was advanced across the stricture and inflated to 16 atm for 1 minute.  Subsequently, a 7 French sheath was exchanged for the 6 French sheath and then a 10 x 8 [place] balloon was advanced across the stricture and angioplasty was performed to 20 atm for little over 1 minute. Follow-up imaging demonstrated complete resolution of the stenosis in the stricture in this area. Reflux imaging with the balloon up demonstrated a moderate stenosis of the arterial portion as previously described.  A 4-0 Monocryl purse-string suture was sewn around the sheath.  The sheath was removed and light pressure was applied. A small hematoma was noted and based on this I did not feel that increasing or improving the inflow would be appropriate at this time as it would probably cause increased expansion of the hematoma. A sterile bandage was applied to the puncture site.  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 The axillary subclavian innominate and superior vena cava are widely patent. On reflux images there is a moderate to severe stenosis of the arterial portion  approximately 1 cm above the actual anastomosis as was previously identified in the past study. It does not look to be quite as severe as it was on the last exam. As noted above given the hematoma that developed after pulling out the 7 French sheath did not feel that improving the inflow was going to do anything more at this time except increase the hematomas expansion.  My plan will be to interrogate this fistula with ultrasound and see if I can identify any further areas that show velocity shifts that would account for  the increased pressure within the fistula itself at this point after reviewing and re-reviewing the images obtained today there are no further strictures or stenoses that I can identify.    COMPLICATIONS: None  CONDITION: [name], M.D [facility] Office: [phone]  11/12/2015 11:35 AM

## 2016-03-17 ENCOUNTER — Inpatient Hospital Stay
Admission: EM | Admit: 2016-03-17 | Discharge: 2016-03-27 | DRG: 291 | Disposition: E | Payer: Medicare Other | Attending: Internal Medicine | Admitting: Internal Medicine

## 2016-03-17 ENCOUNTER — Emergency Department: Payer: Medicare Other

## 2016-03-17 ENCOUNTER — Encounter: Payer: Self-pay | Admitting: Radiology

## 2016-03-17 DIAGNOSIS — E162 Hypoglycemia, unspecified: Secondary | ICD-10-CM | POA: Diagnosis present

## 2016-03-17 DIAGNOSIS — I481 Persistent atrial fibrillation: Secondary | ICD-10-CM | POA: Diagnosis present

## 2016-03-17 DIAGNOSIS — R601 Generalized edema: Secondary | ICD-10-CM | POA: Diagnosis present

## 2016-03-17 DIAGNOSIS — Z841 Family history of disorders of kidney and ureter: Secondary | ICD-10-CM | POA: Diagnosis not present

## 2016-03-17 DIAGNOSIS — R188 Other ascites: Secondary | ICD-10-CM | POA: Diagnosis present

## 2016-03-17 DIAGNOSIS — Z809 Family history of malignant neoplasm, unspecified: Secondary | ICD-10-CM | POA: Diagnosis not present

## 2016-03-17 DIAGNOSIS — I469 Cardiac arrest, cause unspecified: Secondary | ICD-10-CM | POA: Diagnosis present

## 2016-03-17 DIAGNOSIS — Z9889 Other specified postprocedural states: Secondary | ICD-10-CM

## 2016-03-17 DIAGNOSIS — Z66 Do not resuscitate: Secondary | ICD-10-CM | POA: Diagnosis present

## 2016-03-17 DIAGNOSIS — Z87891 Personal history of nicotine dependence: Secondary | ICD-10-CM

## 2016-03-17 DIAGNOSIS — D631 Anemia in chronic kidney disease: Secondary | ICD-10-CM | POA: Diagnosis present

## 2016-03-17 DIAGNOSIS — I248 Other forms of acute ischemic heart disease: Secondary | ICD-10-CM | POA: Diagnosis present

## 2016-03-17 DIAGNOSIS — Z992 Dependence on renal dialysis: Secondary | ICD-10-CM | POA: Diagnosis not present

## 2016-03-17 DIAGNOSIS — Z79899 Other long term (current) drug therapy: Secondary | ICD-10-CM

## 2016-03-17 DIAGNOSIS — I5033 Acute on chronic diastolic (congestive) heart failure: Secondary | ICD-10-CM | POA: Diagnosis present

## 2016-03-17 DIAGNOSIS — Z85038 Personal history of other malignant neoplasm of large intestine: Secondary | ICD-10-CM

## 2016-03-17 DIAGNOSIS — N2581 Secondary hyperparathyroidism of renal origin: Secondary | ICD-10-CM | POA: Diagnosis present

## 2016-03-17 DIAGNOSIS — K219 Gastro-esophageal reflux disease without esophagitis: Secondary | ICD-10-CM | POA: Diagnosis present

## 2016-03-17 DIAGNOSIS — Z90710 Acquired absence of both cervix and uterus: Secondary | ICD-10-CM | POA: Diagnosis not present

## 2016-03-17 DIAGNOSIS — N186 End stage renal disease: Secondary | ICD-10-CM | POA: Diagnosis present

## 2016-03-17 DIAGNOSIS — M1990 Unspecified osteoarthritis, unspecified site: Secondary | ICD-10-CM | POA: Diagnosis present

## 2016-03-17 DIAGNOSIS — D693 Immune thrombocytopenic purpura: Secondary | ICD-10-CM | POA: Diagnosis present

## 2016-03-17 DIAGNOSIS — R0600 Dyspnea, unspecified: Secondary | ICD-10-CM

## 2016-03-17 DIAGNOSIS — I132 Hypertensive heart and chronic kidney disease with heart failure and with stage 5 chronic kidney disease, or end stage renal disease: Principal | ICD-10-CM | POA: Diagnosis present

## 2016-03-17 DIAGNOSIS — J45909 Unspecified asthma, uncomplicated: Secondary | ICD-10-CM | POA: Diagnosis present

## 2016-03-17 DIAGNOSIS — J449 Chronic obstructive pulmonary disease, unspecified: Secondary | ICD-10-CM | POA: Diagnosis present

## 2016-03-17 DIAGNOSIS — J9621 Acute and chronic respiratory failure with hypoxia: Secondary | ICD-10-CM | POA: Diagnosis present

## 2016-03-17 DIAGNOSIS — I120 Hypertensive chronic kidney disease with stage 5 chronic kidney disease or end stage renal disease: Secondary | ICD-10-CM | POA: Diagnosis present

## 2016-03-17 DIAGNOSIS — J9600 Acute respiratory failure, unspecified whether with hypoxia or hypercapnia: Secondary | ICD-10-CM | POA: Diagnosis present

## 2016-03-17 DIAGNOSIS — Z9049 Acquired absence of other specified parts of digestive tract: Secondary | ICD-10-CM

## 2016-03-17 DIAGNOSIS — Z9981 Dependence on supplemental oxygen: Secondary | ICD-10-CM | POA: Diagnosis not present

## 2016-03-17 DIAGNOSIS — J42 Unspecified chronic bronchitis: Secondary | ICD-10-CM | POA: Diagnosis not present

## 2016-03-17 DIAGNOSIS — Z8249 Family history of ischemic heart disease and other diseases of the circulatory system: Secondary | ICD-10-CM | POA: Diagnosis not present

## 2016-03-17 DIAGNOSIS — I272 Other secondary pulmonary hypertension: Secondary | ICD-10-CM | POA: Diagnosis present

## 2016-03-17 DIAGNOSIS — E039 Hypothyroidism, unspecified: Secondary | ICD-10-CM | POA: Diagnosis present

## 2016-03-17 DIAGNOSIS — J810 Acute pulmonary edema: Secondary | ICD-10-CM

## 2016-03-17 DIAGNOSIS — J9601 Acute respiratory failure with hypoxia: Secondary | ICD-10-CM | POA: Diagnosis not present

## 2016-03-17 DIAGNOSIS — I10 Essential (primary) hypertension: Secondary | ICD-10-CM | POA: Diagnosis present

## 2016-03-17 HISTORY — DX: Malignant (primary) neoplasm, unspecified: C80.1

## 2016-03-17 HISTORY — DX: Unspecified asthma, uncomplicated: J45.909

## 2016-03-17 LAB — TYPE AND SCREEN
ABO/RH(D): AB POS
ANTIBODY SCREEN: NEGATIVE

## 2016-03-17 LAB — BLOOD GAS, ARTERIAL
ACID-BASE EXCESS: 5.3 mmol/L — AB (ref 0.0–3.0)
ACID-BASE EXCESS: 0.8 mmol/L (ref 0.0–3.0)
ALLENS TEST (PASS/FAIL): POSITIVE — AB
ALLENS TEST (PASS/FAIL): POSITIVE — AB
BICARBONATE: 29.9 meq/L — AB (ref 21.0–28.0)
Bicarbonate: 25.3 mEq/L (ref 21.0–28.0)
DELIVERY SYSTEMS: POSITIVE
Expiratory PAP: 5
FIO2: 1
FIO2: 1
INSPIRATORY PAP: 10
LHR: 10 {breaths}/min
O2 Saturation: 92.5 %
PATIENT TEMPERATURE: 37
PCO2 ART: 43 mmHg (ref 32.0–48.0)
PCO2 ART: 39 mmHg (ref 32.0–48.0)
PO2 ART: 23 mmHg — AB (ref 83.0–108.0)
Patient temperature: 37
pH, Arterial: 7.45 (ref 7.350–7.450)
pH, Arterial: 7.42 (ref 7.350–7.450)
pO2, Arterial: 64 mmHg — ABNORMAL LOW (ref 83.0–108.0)

## 2016-03-17 LAB — COMPREHENSIVE METABOLIC PANEL
ALBUMIN: 3.1 g/dL — AB (ref 3.5–5.0)
ALT: 6 U/L — ABNORMAL LOW (ref 14–54)
ANION GAP: 13 (ref 5–15)
AST: 14 U/L — AB (ref 15–41)
Alkaline Phosphatase: 141 U/L — ABNORMAL HIGH (ref 38–126)
BUN: 25 mg/dL — AB (ref 6–20)
CHLORIDE: 96 mmol/L — AB (ref 101–111)
CO2: 22 mmol/L (ref 22–32)
Calcium: 7.5 mg/dL — ABNORMAL LOW (ref 8.9–10.3)
Creatinine, Ser: 4.79 mg/dL — ABNORMAL HIGH (ref 0.44–1.00)
GFR calc Af Amer: 9 mL/min — ABNORMAL LOW (ref >60)
GFR, EST NON AFRICAN AMERICAN: 8 mL/min — AB (ref >60)
GLUCOSE: 65 mg/dL (ref 65–99)
POTASSIUM: 4 mmol/L (ref 3.5–5.1)
Sodium: 131 mmol/L — ABNORMAL LOW (ref 135–145)
Total Bilirubin: 2.5 mg/dL — ABNORMAL HIGH (ref 0.3–1.2)
Total Protein: 7.4 g/dL (ref 6.5–8.1)

## 2016-03-17 LAB — CBC WITH DIFFERENTIAL/PLATELET
BASOS ABS: 0.1 10*3/uL (ref 0–0.1)
BASOS PCT: 3 %
Eosinophils Absolute: 0 10*3/uL (ref 0–0.7)
Eosinophils Relative: 1 %
HEMATOCRIT: 34.6 % — AB (ref 35.0–47.0)
HEMOGLOBIN: 11 g/dL — AB (ref 12.0–16.0)
LYMPHS PCT: 15 %
Lymphs Abs: 0.7 10*3/uL — ABNORMAL LOW (ref 1.0–3.6)
MCH: 28.5 pg (ref 26.0–34.0)
MCHC: 31.7 g/dL — ABNORMAL LOW (ref 32.0–36.0)
MCV: 90 fL (ref 80.0–100.0)
MONO ABS: 0.3 10*3/uL (ref 0.2–0.9)
Monocytes Relative: 7 %
NEUTROS ABS: 3.5 10*3/uL (ref 1.4–6.5)
NEUTROS PCT: 74 %
Platelets: 163 10*3/uL (ref 150–440)
RBC: 3.84 MIL/uL (ref 3.80–5.20)
RDW: 19.5 % — AB (ref 11.5–14.5)
WBC: 4.7 10*3/uL (ref 3.6–11.0)

## 2016-03-17 LAB — LIPASE, BLOOD: LIPASE: 26 U/L (ref 11–51)

## 2016-03-17 LAB — APTT: APTT: 44 s — AB (ref 24–36)

## 2016-03-17 LAB — TROPONIN I: TROPONIN I: 0.21 ng/mL — AB (ref <0.031)

## 2016-03-17 LAB — LACTIC ACID, PLASMA: LACTIC ACID, VENOUS: 1.8 mmol/L (ref 0.5–2.0)

## 2016-03-17 LAB — PROTIME-INR
INR: 1.44
PROTHROMBIN TIME: 17.6 s — AB (ref 11.4–15.0)

## 2016-03-17 MED ORDER — PIPERACILLIN-TAZOBACTAM 3.375 G IVPB: 3.3750 g | Freq: Two times a day (BID) | INTRAVENOUS | Status: DC

## 2016-03-17 MED ORDER — PIPERACILLIN-TAZOBACTAM 3.375 G IVPB 30 MIN: 3.3750 g | Freq: Once | INTRAVENOUS | Status: DC

## 2016-03-17 MED ORDER — IOPAMIDOL (ISOVUE-370) INJECTION 76%
100.0000 mL | Freq: Once | INTRAVENOUS | Status: AC | PRN
  Administered 2016-03-17: 100 mL via INTRAVENOUS

## 2016-03-17 MED ORDER — IOHEXOL 240 MG/ML SOLN
25.0000 mL | Freq: Once | INTRAMUSCULAR | Status: AC | PRN
  Administered 2016-03-17: 25 mL via ORAL

## 2016-03-17 MED ORDER — VANCOMYCIN HCL IN DEXTROSE 1-5 GM/200ML-% IV SOLN: 1000.0000 mg | Freq: Once | INTRAVENOUS | Status: DC

## 2016-03-17 MED ORDER — ONDANSETRON HCL 4 MG/2ML IJ SOLN
INTRAMUSCULAR | Status: AC
  Filled 2016-03-17: qty 2

## 2016-03-17 MED ORDER — IOHEXOL 240 MG/ML SOLN: 25.0000 mL | Freq: Once | INTRAMUSCULAR | Status: DC | PRN

## 2016-03-17 MED ORDER — ONDANSETRON HCL 4 MG/2ML IJ SOLN
4.0000 mg | Freq: Once | INTRAMUSCULAR | Status: AC
  Administered 2016-03-17: 4 mg via INTRAVENOUS

## 2016-03-17 MED ORDER — IPRATROPIUM-ALBUTEROL 0.5-2.5 (3) MG/3ML IN SOLN
3.0000 mL | Freq: Once | RESPIRATORY_TRACT | Status: AC
  Administered 2016-03-17: 3 mL via RESPIRATORY_TRACT
  Filled 2016-03-17: qty 3

## 2016-03-17 MED ORDER — SODIUM CHLORIDE 0.9 % IV BOLUS (SEPSIS)
250.0000 mL | Freq: Once | INTRAVENOUS | Status: AC
  Administered 2016-03-17: 250 mL via INTRAVENOUS

## 2016-03-17 MED ORDER — VANCOMYCIN HCL 10 G IV SOLR
1500.0000 mg | Freq: Once | INTRAVENOUS | Status: AC
  Administered 2016-03-17: 1500 mg via INTRAVENOUS
  Filled 2016-03-17: qty 1500

## 2016-03-17 NOTE — H&P (Signed)
[facility] NAME: [name]    MR#:  [id number]  DATE OF BIRTH:  [birth year]  DATE OF ADMISSION:  03/25/2016  PRIMARY CARE PHYSICIAN: Madelyn Brunner, MD   REQUESTING/REFERRING PHYSICIAN: Edd Fabian, MD  CHIEF COMPLAINT:   Chief Complaint  Patient presents with  . Weakness  . Diarrhea    HISTORY OF PRESENT ILLNESS:  [name]  is a 75 y.o. female who presents with Acute onset dyspnea and then over shortness of breath. She states that this happened today while she was up and about trying to clean. When she arrived to the ED she was hypoxic requiring BiPAP. Chest CT did not show any PE, though it did show significant pulmonary edema and vascular congestion. Patient was initially also started on sepsis protocol in the ED, though in taking further history and looking at her labs there is no clear overt sign of infection, with a normal white blood cell count and no history of recent fevers chills or other infectious symptoms. ED physician called nephrologist for urgent dialysis, and then hospitalists were called for admission.  PAST MEDICAL HISTORY:   Past Medical History  Diagnosis Date  . Hypertension   . Hyperlipidemia   . Thyroid disease     Hypothyroidism  . ESRD (end stage renal disease) on dialysis ([place])   . 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 COPD (chronic obstructive pulmonary disease) ([place])   . Diverticulitis 2013 sept.      hospitalized  at [place].  . Anginal pain ([place])   . Hypothyroidism   . Shortness of breath   . Pneumonia   . Chronic diastolic CHF (congestive heart failure) ([place])   . GERD (gastroesophageal reflux disease)   . Arthritis   . Asthma   . Cancer [facility])     PAST SURGICAL HISTORY:   Past Surgical History  Procedure Laterality Date  . Av fistula placement, brachiocephalic  [id number]    right arm  . Abdominal hysterectomy    . Colonoscopy  Dec 2013    colon cancer .. surgery  to be  2014  . Shuntogram Left 05/28/2013    Procedure: SHUNTOGRAM;  Surgeon: Conrad Corwin, MD;  Location[facility] CATH LAB;  Service: Cardiovascular;  Laterality: Left;  . Peripheral vascular catheterization Left 06/13/2015    Procedure: A/V Shuntogram/Fistulagram;  Surgeon: Katha Cabal, MD;  Location: [place] CV LAB;  Service: Cardiovascular;  Laterality: Left;  . Peripheral vascular catheterization Left 06/13/2015    Procedure: A/V Shunt Intervention;  Surgeon: Katha Cabal, MD;  Location: [place] CV LAB;  Service: Cardiovascular;  Laterality: Left;  . Colon surgery    . Peripheral vascular catheterization N/A 11/12/2015    Procedure: A/V Shuntogram/Fistulagram;  Surgeon: Katha Cabal, MD;  Location: [place] CV LAB;  Service: Cardiovascular;  Laterality: N/A;  . Peripheral vascular catheterization N/A 11/12/2015    Procedure: A/V Shunt Intervention;  Surgeon: Katha Cabal, MD;  Location: [place] CV LAB;  Service: Cardiovascular;  Laterality: N/A;    SOCIAL HISTORY:   Social History  Substance Use Topics  . Smoking status: Former Smoker -- 0.50 packs/day for 20 years    Types: Cigarettes    Quit date: 06/12/1998  . Smokeless tobacco: Never Used  . Alcohol Use: No    FAMILY HISTORY:   Family History  Problem Relation Age of Onset  . Heart disease Mother   . Cancer Father   .  Kidney disease Brother     DRUG ALLERGIES:   Allergies  Allergen Reactions  . Shellfish-Derived Products Anaphylaxis and Hives    MEDICATIONS AT HOME:   Prior to Admission medications   Medication Sig Start Date End Date Taking? Authorizing Provider  acetaminophen (TYLENOL) 500 MG tablet Take 1,000 mg by mouth every 4 (four) hours as needed for mild pain or headache.    Yes Historical Provider, MD  b complex-vitamin c-folic acid (NEPHRO-VITE) 0.8 MG TABS tablet Take 1 tablet by mouth at bedtime. 05/06/15  Yes Henreitta Leber, MD  budesonide-formoterol (SYMBICORT) 160-4.5  MCG/ACT inhaler Inhale 2 puffs into the lungs 2 (two) times daily.   Yes Historical Provider, MD  calcium acetate (PHOSLO) 667 MG capsule Take 667 mg by mouth 3 (three) times daily with meals.   Yes Historical Provider, MD  diphenhydrAMINE (BENADRYL) 25 mg capsule Take 25 mg by mouth 3 (three) times daily as needed for itching or sleep.    Yes Historical Provider, MD  Ipratropium-Albuterol (COMBIVENT RESPIMAT) 20-100 MCG/ACT AERS respimat Inhale 1 puff into the lungs every 6 (six) hours as needed for wheezing or shortness of breath.   Yes Historical Provider, MD  isosorbide mononitrate (IMDUR) 60 MG 24 hr tablet Take 60 mg by mouth daily.    Yes Historical Provider, MD  levothyroxine (SYNTHROID, LEVOTHROID) 125 MCG tablet Take 125 mcg by mouth daily before breakfast.    Yes Historical Provider, MD  metoprolol tartrate (LOPRESSOR) 25 MG tablet Take 25 mg by mouth 2 (two) times daily.   Yes Historical Provider, MD  ondansetron (ZOFRAN) 4 MG tablet Take 4 mg by mouth every 8 (eight) hours as needed for nausea or vomiting.   Yes Historical Provider, MD  pantoprazole (PROTONIX) 40 MG tablet Take 40 mg by mouth 2 (two) times daily.   Yes Historical Provider, MD  sevelamer (RENVELA) 800 MG tablet Take 800 mg by mouth 3 (three) times daily with meals.    Yes Historical Provider, MD    REVIEW OF SYSTEMS:  Review of Systems  Constitutional: Negative for fever, chills, weight loss and malaise/fatigue.  HENT: Negative for ear pain, hearing loss and tinnitus.   Eyes: Negative for blurred vision, double vision, pain and redness.  Respiratory: Positive for shortness of breath. Negative for cough and hemoptysis.   Cardiovascular: Positive for leg swelling. Negative for chest pain, palpitations and orthopnea.  Gastrointestinal: Negative for nausea, vomiting, abdominal pain, diarrhea and constipation.  Genitourinary: Negative for dysuria, frequency and hematuria.  Musculoskeletal: Negative for back pain, joint  pain and neck pain.  Skin:       No acne, rash, or lesions  Neurological: Negative for dizziness, tremors, focal weakness and weakness.  Endo/Heme/Allergies: Negative for polydipsia. Does not bruise/bleed easily.  Psychiatric/Behavioral: Negative for depression. The patient is not nervous/anxious and does not have insomnia.      VITAL SIGNS:   Filed Vitals:   03/02/2016 2017 03/20/2016 2030 03/15/2016 2130 03/13/2016 2209  BP: 101/6 112/70 114/63 101/60  Pulse: 112 113  113  Temp:      TempSrc:      Resp: 30 26 25 27   Height:      Weight:      SpO2: 90% 85%  100%   Wt Readings from Last 3 Encounters:  03/24/2016 74.8 kg (164 lb 14.5 oz)  11/12/15 78.926 kg (174 lb)  09/02/15 74 kg (163 lb 2.3 oz)    PHYSICAL EXAMINATION:  Physical Exam  Vitals reviewed. Constitutional:  She is oriented to person, place, and time. She appears well-developed and well-nourished. No distress.  HENT:  Head: Normocephalic and atraumatic.  Mouth/Throat: Oropharynx is clear and moist.  Eyes: Conjunctivae and EOM are normal. Pupils are equal, round, and reactive to light. No scleral icterus.  Neck: Normal range of motion. Neck supple. No JVD present. No thyromegaly present.  Cardiovascular: Normal rate, regular rhythm and intact distal pulses.  Exam reveals no gallop and no friction rub.   No murmur heard. Respiratory: She is in respiratory distress. She has wheezes. She has rales.  GI: Soft. Bowel sounds are normal. She exhibits no distension. There is no tenderness.  Musculoskeletal: Normal range of motion. She exhibits edema.  No arthritis, no gout  Lymphadenopathy:    She has no cervical adenopathy.  Neurological: She is alert and oriented to person, place, and time. No cranial nerve deficit.  No dysarthria, no aphasia  Skin: Skin is warm and dry. No rash noted. No erythema.  Psychiatric: She has a normal mood and affect. Her behavior is normal. Judgment and thought content normal.    LABORATORY  PANEL:   CBC  Recent Labs Lab 03/25/2016 1835  WBC 4.7  HGB 11.0*  HCT 34.6*  PLT 163   ------------------------------------------------------------------------------------------------------------------  Chemistries   Recent Labs Lab 03/12/2016 1835  NA 131*  K 4.0  CL 96*  CO2 22  GLUCOSE 65  BUN 25*  CREATININE 4.79*  CALCIUM 7.5*  AST 14*  ALT 6*  ALKPHOS 141*  BILITOT 2.5*   ------------------------------------------------------------------------------------------------------------------  Cardiac Enzymes  Recent Labs Lab 03/16/2016 1835  TROPONINI 0.21*   ------------------------------------------------------------------------------------------------------------------  RADIOLOGY:  Ct Angio Chest Pe W/cm &/or Wo Cm  02/25/2016  CLINICAL DATA:  Increasing weakness and loss of appetite for several days. Shortness of breath and hypoxia. Diarrhea for 1 month. Dialysis patient. History of colon cancer. Left lower quadrant pain. EXAM: CT ANGIOGRAPHY CHEST CT ABDOMEN AND PELVIS WITH CONTRAST TECHNIQUE: Multidetector CT imaging of the chest was performed using the standard protocol during bolus administration of intravenous contrast. Multiplanar CT image reconstructions and MIPs were obtained to evaluate the vascular anatomy. Multidetector CT imaging of the abdomen and pelvis was performed using the standard protocol during bolus administration of intravenous contrast. CONTRAST:  100 mL Isovue 370 COMPARISON:  CT abdomen and pelvis 10/07/2013 FINDINGS: CTA CHEST FINDINGS Technically adequate study with moderately good opacification of the central and segmental pulmonary arteries. No focal filling defects are demonstrated. No evidence of significant pulmonary embolus. Mild cardiac enlargement. Calcifications in the aortic and mitral valves. Calcification in the coronary arteries. Calcification throughout the aorta. Probable calcific stenosis of the origin of the left subclavian  artery. No aortic dissection. Mediastinal lymph nodes are not pathologically enlarged. Esophagus is decompressed. Small amount of fluid in the lower mediastinum apparently extends to the diaphragmatic hiatus. Multiple venous collaterals in the right side of the chest. Apparent stenosis of the origin of the right subclavian vein with collateral reconstitution at the SVC. Evaluation of lungs is limited due to respiratory motion artifact. There are small bilateral pleural effusions with basilar atelectasis. Somewhat nodular focal consolidation in the left lung base is probably due to atelectasis but an underlying mass is not excluded. Follow-up after resolution of acute process is recommended. Diffuse interstitial pattern and diffuse mosaic appearance to the lungs suggests pulmonary edema. Airways are patent. CT ABDOMEN and PELVIS FINDINGS Large volume diffuse free fluid throughout the abdomen and pelvis. Appearance is consistent with ascites. The this  is new since the previous study. The liver, spleen, gallbladder, pancreas, adrenal glands, inferior vena cava, and retroperitoneal lymph nodes are unremarkable. Prominent diffuse calcification of the abdominal aorta and branch vessel origins. There is likely calcific stenosis of the iliac arteries bilaterally with occlusion of the left internal iliac artery. No aneurysm. Stomach, small bowel, and colon are not abnormally distended. No free air in the abdomen. Diffuse edema throughout the subcutaneous fat and soft tissues. Kidneys are markedly atrophic bilaterally with small cysts appreciated. No hydronephrosis. Pelvis: Bladder is decompressed and cannot be evaluated. Uterus is surgically absent. Appendix appears surgically absent. No pelvic mass or lymphadenopathy. No destructive bone lesions. Review of the MIP images confirms the above findings. IMPRESSION: Chest: No evidence of significant pulmonary embolus. Cardiac enlargement with pulmonary vascular congestion and  diffuse pulmonary edema. Small bilateral pleural effusions. Atelectasis or consolidation in the lung bases. Focal nodular consolidation in the left lung base is probably due to atelectasis but underlying mass lesion is not excluded and followup after resolution of acute process is suggested. 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 Multiple right chest wall collaterals. Probable high-grade stenosis of the left subclavian artery. Abdomen: Large volume diffuse ascites throughout the abdomen and pelvis. This is new since previous study. Diffuse soft tissue edema. Prominent vascular calcifications. Electronically Signed   By: Lucienne Capers M.D.   On: 03/04/2016 21:05   Ct Abdomen Pelvis W Contrast  02/27/2016  CLINICAL DATA:  Increasing weakness and loss of appetite for several days. Shortness of breath and hypoxia. Diarrhea for 1 month. Dialysis patient. History of colon cancer. Left lower quadrant pain. EXAM: CT ANGIOGRAPHY CHEST CT ABDOMEN AND PELVIS WITH CONTRAST TECHNIQUE: Multidetector CT imaging of the chest was performed using the standard protocol during bolus administration of intravenous contrast. Multiplanar CT image reconstructions and MIPs were obtained to evaluate the vascular anatomy. Multidetector CT imaging of the abdomen and pelvis was performed using the standard protocol during bolus administration of intravenous contrast. CONTRAST:  100 mL Isovue 370 COMPARISON:  CT abdomen and pelvis 10/07/2013 FINDINGS: CTA CHEST FINDINGS Technically adequate study with moderately good opacification of the central and segmental pulmonary arteries. No focal filling defects are demonstrated. No evidence of significant pulmonary embolus. Mild cardiac enlargement. Calcifications in the aortic and mitral valves. Calcification in the coronary arteries. Calcification throughout the aorta. Probable calcific stenosis of the origin of the left subclavian artery. No aortic  dissection. Mediastinal lymph nodes are not pathologically enlarged. Esophagus is decompressed. Small amount of fluid in the lower mediastinum apparently extends to the diaphragmatic hiatus. Multiple venous collaterals in the right side of the chest. Apparent stenosis of the origin of the right subclavian vein with collateral reconstitution at the SVC. Evaluation of lungs is limited due to respiratory motion artifact. There are small bilateral pleural effusions with basilar atelectasis. Somewhat nodular focal consolidation in the left lung base is probably due to atelectasis but an underlying mass is not excluded. Follow-up after resolution of acute process is recommended. Diffuse interstitial pattern and diffuse mosaic appearance to the lungs suggests pulmonary edema. Airways are patent. CT ABDOMEN and PELVIS FINDINGS Large volume diffuse free fluid throughout the abdomen and pelvis. Appearance is consistent with ascites. The this is new since the previous study. The liver, spleen, gallbladder, pancreas, adrenal glands, inferior vena cava, and retroperitoneal lymph nodes are unremarkable. Prominent diffuse calcification of the abdominal aorta and branch vessel origins. There is likely calcific stenosis of the iliac  arteries bilaterally with occlusion of the left internal iliac artery. No aneurysm. Stomach, small bowel, and colon are not abnormally distended. No free air in the abdomen. Diffuse edema throughout the subcutaneous fat and soft tissues. Kidneys are markedly atrophic bilaterally with small cysts appreciated. No hydronephrosis. Pelvis: Bladder is decompressed and cannot be evaluated. Uterus is surgically absent. Appendix appears surgically absent. No pelvic mass or lymphadenopathy. No destructive bone lesions. Review of the MIP images confirms the above findings. IMPRESSION: Chest: No evidence of significant pulmonary embolus. Cardiac enlargement with pulmonary vascular congestion and diffuse pulmonary  edema. Small bilateral pleural effusions. Atelectasis or consolidation in the lung bases. Focal nodular consolidation in the left lung base is probably due to atelectasis but underlying mass lesion is not excluded and followup after resolution of acute process is suggested. 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 Multiple right chest wall collaterals. Probable high-grade stenosis of the left subclavian artery. Abdomen: Large volume diffuse ascites throughout the abdomen and pelvis. This is new since previous study. Diffuse soft tissue edema. Prominent vascular calcifications. Electronically Signed   By: Lucienne Capers M.D.   On: 03/12/2016 21:05   Dg Chest Port 1 View  02/29/2016  CLINICAL DATA:  Worsening weakness. Diarrhea. Shortness of breath. Atrial fibrillation. COPD. Congestive heart failure. EXAM: PORTABLE CHEST 1 VIEW COMPARISON:  09/02/2015 FINDINGS: Cardiomegaly is again noted. Decreased lung volumes since prior study. Bilateral pleural- parenchymal scarring again seen, right side greater than left. No evidence of pulmonary consolidation or edema. No definite evidence of pleural effusion or pneumothorax. IMPRESSION: Stable cardiomegaly. Low lung volumes with no significant change in bilateral pleural-parenchymal scarring. No acute findings. Electronically Signed   By: Earle Gell M.D.   On: 03/09/2016 18:57    EKG:   Orders placed or performed during the hospital encounter of 03/25/2016  . EKG 12-Lead  . EKG 12-Lead  . EKG 12-Lead  . EKG 12-Lead  . ED EKG 12-Lead  . ED EKG 12-Lead  . ED EKG  . ED EKG    IMPRESSION AND PLAN:  Principal Problem:   Acute respiratory failure ([place]) - due to pulmonary edema and vascular congestion. This is likely due to some fluid overload. Patient oxygenating well on BiPAP, and feels more comfortable. Continued until able to wean as tolerated. Treat as below. Active Problems:   ESRD (end stage renal disease) on  dialysis [facility]) - now with likely fluid overload in conjunction with CHF flare. See below for CHF treatment. Nephrology consulted for dialysis session.   COPD (chronic obstructive pulmonary disease) ([place]) - continue home inhalers, wean BiPAP as above.   Acute on chronic diastolic (congestive) heart failure ([facility]) - currently on BiPAP, wean as above. HD session for fluid overload. We'll trend cardiac enzymes, and order an echocardiogram and cardiology consult.   HTN (hypertension) - borderline low. Hold home antihypertensives for now.   Hypothyroidism - continue home dose thyroid replacement  All the records are reviewed and case discussed with ED provider. Management plans discussed with the patient and/or family.  DVT PROPHYLAXIS: SubQ heparin  GI PROPHYLAXIS: None  ADMISSION STATUS: Inpatient  CODE STATUS: Full Code Status History    Date Active Date Inactive Code Status Order ID Comments User Context   11/12/2015 11:56 AM 11/12/2015  3:47 PM Full Code [id number]  Katha Cabal, MD Inpatient   05/01/2015 10:49 AM 05/06/2015  7:35 PM Partial Code [id number]  Lance Coon, MD ED   05/01/2015 10:15  AM 05/01/2015 10:49 AM DNR [id number]  Lance Coon, MD ED   05/01/2015 10:15 AM 05/01/2015 10:15 AM DNR [id number]  Lance Coon, MD ED   04/19/2013  6:56 PM 04/23/2013 10:14 PM Full Code [id number]  Timmothy Euler, MD Inpatient   09/18/2012  1:51 AM 09/20/2012  9:38 PM Full Code [id number]  Dudley Major, RN Inpatient      TOTAL Critical care TIME TAKING CARE OF THIS PATIENT: 45 minutes.    Rosi Secrist [place] 03/22/2016, 11:02 PM  [employer] [facility]  Office  [phone]  CC: Primary care physician; Madelyn Brunner, MD

## 2016-03-17 NOTE — ED Notes (Signed)
Pt arrives from home with reports of increased weakness and loss of apetitte over the last several days  Pt with reports of diarrhea for greater than one month and it has gotten worse over the past week  She is on dialysis Tues, Thurs, and Sat  Left AV fistula present +bruit +thrill   Hx Afib

## 2016-03-17 NOTE — ED Provider Notes (Signed)
[facility] Emergency Department Provider Note  ____________________________________________  Time seen: Approximately 7:42 PM  I have reviewed the triage vital signs and the nursing notes.   HISTORY  Chief Complaint Weakness and Diarrhea    HPI [name] is a 75 y.o. female with hypertension, hyperlipidemia, CHF, COPD with home oxygen requirement, end-stage renal disease on dialysis last dialyzed yesterday who presents for evaluation of diarrhea, nausea and left-sided abdominal pain today, gradual onset, constant since onset, currently severe. She reports nonbloody diarrhea. No fevers. No chest pain. Chronic shortness of breath which she reports is not increased from baseline. She no longer produces urine and cannot attest to dysuria.   Past Medical History  Diagnosis Date  . Hypertension   . Hyperlipidemia   . Thyroid disease     Hypothyroidism  . ESRD (end stage renal disease) on dialysis ([place])   . 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 COPD (chronic obstructive pulmonary disease) ([place])   . Diverticulitis 2013 sept.      hospitalized  at [place].  . Anginal pain ([place])   . Hypothyroidism   . Shortness of breath   . Pneumonia   . Chronic diastolic CHF (congestive heart failure) ([place])   . GERD (gastroesophageal reflux disease)   . Arthritis   . Asthma   . Cancer [facility])     Patient Active Problem List   Diagnosis Date Noted  . Sepsis ([place]) 05/01/2015  . HCAP (healthcare-associated pneumonia) 05/01/2015  . Hypoglycemia 05/01/2015  . Elevated troponin 05/01/2015  . Diastolic congestive heart failure ([place]) 05/01/2015  . Acute respiratory failure ([place]) 05/01/2015  . Venous stenosis of left upper extremity 05/11/2013  . PSVT (paroxysmal supraventricular tachycardia) ([place]) 04/22/2013  . Diverticulitis of sigmoid colon 04/22/2013  . Clostridium difficile colitis 04/22/2013  . Bacteremia due to Staphylococcus 04/22/2013  . Anemia 09/17/2012  . ESRD  (end stage renal disease) on dialysis ([place]) 09/17/2012  . HTN (hypertension) 09/17/2012  . COPD (chronic obstructive pulmonary disease) ([place]) 09/17/2012  . Hypothyroidism 09/17/2012  . Nausea and vomiting 09/17/2012    Past Surgical History  Procedure Laterality Date  . Av fistula placement, brachiocephalic  [id number]    right arm  . Abdominal hysterectomy    . Colonoscopy  Dec 2013    colon cancer .. surgery  to be 2014  . Shuntogram Left 05/28/2013    Procedure: SHUNTOGRAM;  Surgeon: Conrad Friendship, MD;  Location: [facility] CATH LAB;  Service: Cardiovascular;  Laterality: Left;  . Peripheral vascular catheterization Left 06/13/2015    Procedure: A/V Shuntogram/Fistulagram;  Surgeon: Katha Cabal, MD;  Location: [place] CV LAB;  Service: Cardiovascular;  Laterality: Left;  . Peripheral vascular catheterization Left 06/13/2015    Procedure: A/V Shunt Intervention;  Surgeon: Katha Cabal, MD;  Location: [place] CV LAB;  Service: Cardiovascular;  Laterality: Left;  . Colon surgery    . Peripheral vascular catheterization N/A 11/12/2015    Procedure: A/V Shuntogram/Fistulagram;  Surgeon: Katha Cabal, MD;  Location: [place] CV LAB;  Service: Cardiovascular;  Laterality: N/A;  . Peripheral vascular catheterization N/A 11/12/2015    Procedure: A/V Shunt Intervention;  Surgeon: Katha Cabal, MD;  Location: [place] CV LAB;  Service: Cardiovascular;  Laterality: N/A;    Current Outpatient Rx  Name  Route  Sig  Dispense  Refill  . acetaminophen (TYLENOL) 500 MG tablet   Oral   Take 1,000 mg by mouth every 4 (four) hours as needed for  mild pain or headache.          . b complex-vitamin c-folic acid (NEPHRO-VITE) 0.8 MG TABS tablet   Oral   Take 1 tablet by mouth at bedtime.   30 tablet   1   . budesonide-formoterol (SYMBICORT) 160-4.5 MCG/ACT inhaler   Inhalation   Inhale 2 puffs into the lungs 2 (two) times daily.         . calcium acetate  (PHOSLO) 667 MG capsule   Oral   Take 667 mg by mouth 3 (three) times daily with meals.         . diphenhydrAMINE (BENADRYL) 25 mg capsule   Oral   Take 25 mg by mouth 3 (three) times daily as needed for itching or sleep.          . Ipratropium-Albuterol (COMBIVENT RESPIMAT) 20-100 MCG/ACT AERS respimat   Inhalation   Inhale 1 puff into the lungs every 6 (six) hours as needed for wheezing or shortness of breath.         . isosorbide mononitrate (IMDUR) 60 MG 24 hr tablet   Oral   Take 60 mg by mouth daily.          Marland Kitchen levothyroxine (SYNTHROID, LEVOTHROID) 125 MCG tablet   Oral   Take 125 mcg by mouth daily before breakfast.          . metoprolol tartrate (LOPRESSOR) 25 MG tablet   Oral   Take 25 mg by mouth 2 (two) times daily.         . ondansetron (ZOFRAN) 4 MG tablet   Oral   Take 4 mg by mouth every 8 (eight) hours as needed for nausea or vomiting.         . pantoprazole (PROTONIX) 40 MG tablet   Oral   Take 40 mg by mouth 2 (two) times daily.         . sevelamer (RENVELA) 800 MG tablet   Oral   Take 800 mg by mouth 3 (three) times daily with meals.            Allergies Shellfish-derived products  Family History  Problem Relation Age of Onset  . Heart disease Mother   . Cancer Father   . Kidney disease Brother     Social History Social History  Substance Use Topics  . Smoking status: Former Smoker -- 0.50 packs/day for 20 years    Types: Cigarettes    Quit date: 06/12/1998  . Smokeless tobacco: Never Used  . Alcohol Use: No    Review of Systems Constitutional: No fever/chills Eyes: No visual changes. ENT: No sore throat. Cardiovascular: Denies chest pain. Respiratory: +chronic shortness of breath. Gastrointestinal: + abdominal pain.  + nausea, no vomiting.  + diarrhea.  No constipation. Genitourinary: Negative for dysuria. Musculoskeletal: Negative for back pain. Skin: Negative for rash. Neurological: Negative for headaches,  focal weakness or numbness.  10-point ROS otherwise negative.  ____________________________________________   PHYSICAL EXAM:  VITAL SIGNS: ED Triage Vitals  Enc Vitals Group     BP 03/14/2016 1822 108/72 mmHg     Pulse Rate 03/23/2016 1822 108     Resp 03/11/2016 1822 18     Temp 03/23/2016 1822 97.9 F (36.6 C)     Temp Source 03/20/2016 1822 Oral     SpO2 --      Weight 03/09/2016 1822 160 lb (72.576 kg)     Height 03/02/2016 1822 5\' 4"  (1.626 m)     Head Cir --  Peak Flow --      Pain Score --      Pain Loc --      Pain Edu? --      Excl. in [place]? --     Constitutional: Alert and oriented. Pale and ill-appearing. Eyes: Conjunctivae are normal. PERRL. EOMI. Head: Atraumatic. Nose: No congestion/rhinnorhea. Mouth/Throat: Mucous membranes are dry.  Oropharynx non-erythematous. Neck: No stridor. Supple without meningismus. Cardiovascular: tachycardic  rate, regular rhythm. Grossly normal heart sounds.  Good peripheral circulation. Respiratory: Normal respiratory effort with mild tachypnea. No retractions. Lungs CTAB. Gastrointestinal: Soft with tenderness in the left mid abdomen in the left lower quadrant. + distension. No CVA tenderness. Genitourinary: deferred Musculoskeletal: 2+ edema of the lower extremities bilaterally.  No joint effusions. Neurologic:  Normal speech and language. No gross focal neurologic deficits are appreciated. No gait instability. Skin:  Skin is warm, dry and intact. No rash noted. Psychiatric: Mood and affect are normal. Speech and behavior are normal.  ____________________________________________   LABS (all labs ordered are listed, but only abnormal results are displayed)  Labs Reviewed  COMPREHENSIVE METABOLIC PANEL - Abnormal; Notable for the following:    Sodium 131 (*)    Chloride 96 (*)    BUN 25 (*)    Creatinine, Ser 4.79 (*)    Calcium 7.5 (*)    Albumin 3.1 (*)    AST 14 (*)    ALT 6 (*)    Alkaline Phosphatase 141 (*)    Total  Bilirubin 2.5 (*)    GFR calc non Af Amer 8 (*)    GFR calc Af Amer 9 (*)    All other components within normal limits  TROPONIN I - Abnormal; Notable for the following:    Troponin I 0.21 (*)    All other components within normal limits  CBC WITH DIFFERENTIAL/PLATELET - Abnormal; Notable for the following:    Hemoglobin 11.0 (*)    HCT 34.6 (*)    MCHC 31.7 (*)    RDW 19.5 (*)    Lymphs Abs 0.7 (*)    All other components within normal limits  APTT - Abnormal; Notable for the following:    aPTT 44 (*)    All other components within normal limits  PROTIME-INR - Abnormal; Notable for the following:    Prothrombin Time 17.6 (*)    All other components within normal limits  BLOOD GAS, ARTERIAL - Abnormal; Notable for the following:    pO2, Arterial 23 (*)    Bicarbonate 29.9 (*)    Acid-Base Excess 5.3 (*)    Allens test (pass/fail) POSITIVE (*)    All other components within normal limits  BLOOD GAS, ARTERIAL - Abnormal; Notable for the following:    pO2, Arterial 64 (*)    Allens test (pass/fail) POSITIVE (*)    All other components within normal limits  CULTURE, BLOOD (ROUTINE X 2)  CULTURE, BLOOD (ROUTINE X 2)  URINE CULTURE  RAPID INFLUENZA A&B ANTIGENS ([facility] ONLY)  C DIFFICILE QUICK SCREEN W PCR REFLEX  GASTROINTESTINAL PANEL BY PCR, STOOL (REPLACES STOOL CULTURE)  LACTIC ACID, PLASMA  LIPASE, BLOOD  LACTIC ACID, PLASMA  TYPE AND SCREEN   ____________________________________________  EKG  ED ECG REPORT I, Joanne Gavel, the attending physician, personally viewed and interpreted this ECG.   Date: 03/23/2016  EKG Time: 18:23  Rate: 107  Rhythm: sinus tachycardia  Axis: right  Intervals:none  ST&T Change: No acute ST elevation. Minimal ST depression in leads  2, aVF, V3, V5, V6.  ____________________________________________  RADIOLOGY  CXR  IMPRESSION: Stable cardiomegaly. Low lung volumes with no significant change in bilateral pleural-parenchymal  scarring. No acute findings.  CTA chest and CT abdomen and pelvis IMPRESSION: Chest: No evidence of significant pulmonary embolus. Cardiac enlargement with pulmonary vascular congestion and diffuse pulmonary edema. Small bilateral pleural effusions. Atelectasis or consolidation in the lung bases. Focal nodular consolidation in the left lung base is probably due to atelectasis but underlying mass lesion is not excluded and followup after resolution of acute process is suggested. 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 Multiple right chest wall collaterals. Probable high-grade stenosis of the left subclavian artery.  Abdomen: Large volume diffuse ascites throughout the abdomen and pelvis. This is new since previous study. Diffuse soft tissue edema. Prominent vascular calcifications. ____________________________________________   PROCEDURES  Procedure(s) performed: None  Critical Care performed: Yes. Total critical care time spent 35 minutes.  ____________________________________________   INITIAL IMPRESSION / ASSESSMENT AND PLAN / ED COURSE  Pertinent labs & imaging results that were available during my care of the patient were reviewed by me and considered in my medical decision making (see chart for details).  SIERRAH ALESSANDRO is a 75 y.o. female with hypertension, hyperlipidemia, CHF, COPD with home oxygen requirement, end-stage renal disease on dialysis last dialyzed yesterday who presents for evaluation of diarrhea, nausea and left-sided abdominal pain today. On exam, she appears ill, tachypneic and tachycardic meeting 2 out of 4 Sirs criteria and she had significant abdominal distension. Additionally, her O2 saturation was 66% on her home 2-3 L oxygen requirement. O2 sat is now 90% on nonrebreather. Good sepsis initiated on arrival. We'll give IV vancomycin and Zosyn however will be judicious with IV fluids given her history of end  stage renal disease requiring dialysis. Plan for screening labs, chest x-ray, CT abdomen and pelvis and likely admission.  ----------------------------------------- 10:26 PM on 03/12/2016 ----------------------------------------- CTA chest with no PE, there is notable pulmonary edema. Patient currently on BiPAP O2 sat is 99%. She also shows diffuse ascites. Troponin mildly elevated 0.21 but I suspect this is demand ischemia.  Venous lactic acid is reassuring. Case discussed with Dr. Holley Raring of nephrology who will arrange dialysis tonight. Case discussed with Dr. Jannifer Franklin, hospitalist, for admission. Unable to administer any nitroglycerin given the patient's already mild hypotension. ____________________________________________   FINAL CLINICAL IMPRESSION(S) / ED DIAGNOSES  Final diagnoses:  Acute pulmonary edema ([place])  Ascites  Acute on chronic respiratory failure with hypoxia ([place])  Anasarca      Joanne Gavel, MD 03/12/2016 2244

## 2016-03-17 NOTE — Progress Notes (Signed)
Pharmacy Antibiotic Note  [name] is a 75 y.o. female admitted on 03/06/2016 with sepsis.  Pharmacy has been consulted for vancomycin & piperacillin/tazobactam dosing. Patient has ESRD and received HD on TTS. Dosing based on adjusted body weight of 62 kg.  Plan: Order piperacillin/tazobactam 3.375 g IV q12h EI  Order vancomycin 1500 mg x 1 dose tonight Will then need vancomycin 750 mg IV to be given at the end of each dialysis session Vancomycin trough will need to be ordered prior to 3rd HD session.  Height: 5\' 4"  (162.6 cm) Weight: 160 lb (72.576 kg) IBW/kg (Calculated) : 54.7  Temp (24hrs), Avg:97.9 F (36.6 C), Min:97.9 F (36.6 C), Max:97.9 F (36.6 C)  No results for input(s): WBC, CREATININE, LATICACIDVEN, VANCOTROUGH, VANCOPEAK, VANCORANDOM, GENTTROUGH, GENTPEAK, GENTRANDOM, TOBRATROUGH, TOBRAPEAK, TOBRARND, AMIKACINPEAK, AMIKACINTROU, AMIKACIN in the last 168 hours.  CrCl cannot be calculated (Patient has no serum creatinine result on file.).    Allergies  Allergen Reactions  . Shellfish-Derived Products Anaphylaxis    Antimicrobials this admission: vancomycin 3/22 >>  Piperacillin/tazobactam 3/22 >>   Microbiology results: 3/22 BCx: Sent 3/22 UCx: Sent  3/22 C.diff send  Thank you for allowing pharmacy to be a part of this patient's care.  Lenis Noon, PharmD Clinical Pharmacist 03/26/2016 6:55 PM

## 2016-03-17 NOTE — ED Notes (Signed)
Per family, pt had a BM earlier today and she was cleaning herself and thought she was going to pass out. Pt has not had appetite for past week. Pt aware of stool and urine sample but does not have urge to have BM at this moment. Pt does not make pee.

## 2016-03-17 NOTE — ED Notes (Signed)
Pt unable to drink contrast d/t having a non-rebreather on.

## 2016-03-18 ENCOUNTER — Inpatient Hospital Stay
Admit: 2016-03-18 | Discharge: 2016-03-18 | Disposition: A | Discharge status: Home / Self Care | Payer: Medicare Other | Attending: Internal Medicine | Admitting: Internal Medicine

## 2016-03-18 DIAGNOSIS — J42 Unspecified chronic bronchitis: Secondary | ICD-10-CM

## 2016-03-18 DIAGNOSIS — Z992 Dependence on renal dialysis: Secondary | ICD-10-CM

## 2016-03-18 DIAGNOSIS — J9601 Acute respiratory failure with hypoxia: Secondary | ICD-10-CM

## 2016-03-18 DIAGNOSIS — I5033 Acute on chronic diastolic (congestive) heart failure: Secondary | ICD-10-CM

## 2016-03-18 DIAGNOSIS — N186 End stage renal disease: Secondary | ICD-10-CM

## 2016-03-18 LAB — CBC
HEMATOCRIT: 31.1 % — AB (ref 35.0–47.0)
HEMOGLOBIN: 10 g/dL — AB (ref 12.0–16.0)
MCH: 28.2 pg (ref 26.0–34.0)
MCHC: 32.2 g/dL (ref 32.0–36.0)
MCV: 87.4 fL (ref 80.0–100.0)
Platelets: 154 10*3/uL (ref 150–440)
RBC: 3.56 MIL/uL — ABNORMAL LOW (ref 3.80–5.20)
RDW: 19.4 % — AB (ref 11.5–14.5)
WBC: 5.9 10*3/uL (ref 3.6–11.0)

## 2016-03-18 LAB — ECHOCARDIOGRAM COMPLETE
Height: 64 in
Weight: 2567.92 oz

## 2016-03-18 LAB — BASIC METABOLIC PANEL
Anion gap: 11 (ref 5–15)
BUN: 20 mg/dL (ref 6–20)
CALCIUM: 7.6 mg/dL — AB (ref 8.9–10.3)
CHLORIDE: 97 mmol/L — AB (ref 101–111)
CO2: 28 mmol/L (ref 22–32)
Creatinine, Ser: 4.13 mg/dL — ABNORMAL HIGH (ref 0.44–1.00)
GFR calc Af Amer: 11 mL/min — ABNORMAL LOW (ref >60)
GFR calc non Af Amer: 10 mL/min — ABNORMAL LOW (ref >60)
GLUCOSE: 67 mg/dL (ref 65–99)
POTASSIUM: 3.5 mmol/L (ref 3.5–5.1)
Sodium: 136 mmol/L (ref 135–145)

## 2016-03-18 LAB — RENAL FUNCTION PANEL
ANION GAP: 13 (ref 5–15)
Albumin: 2.9 g/dL — ABNORMAL LOW (ref 3.5–5.0)
BUN: 23 mg/dL — ABNORMAL HIGH (ref 6–20)
CHLORIDE: 95 mmol/L — AB (ref 101–111)
CO2: 28 mmol/L (ref 22–32)
Calcium: 7.8 mg/dL — ABNORMAL LOW (ref 8.9–10.3)
Creatinine, Ser: 4.53 mg/dL — ABNORMAL HIGH (ref 0.44–1.00)
GFR calc Af Amer: 10 mL/min — ABNORMAL LOW (ref >60)
GFR, EST NON AFRICAN AMERICAN: 9 mL/min — AB (ref >60)
GLUCOSE: 123 mg/dL — AB (ref 65–99)
POTASSIUM: 3.8 mmol/L (ref 3.5–5.1)
Phosphorus: 4 mg/dL (ref 2.5–4.6)
Sodium: 136 mmol/L (ref 135–145)

## 2016-03-18 LAB — RAPID INFLUENZA A&B ANTIGENS
Influenza A (ARMC): NEGATIVE
Influenza B (ARMC): NEGATIVE

## 2016-03-18 LAB — TROPONIN I
TROPONIN I: 0.14 ng/mL — AB (ref <0.031)
TROPONIN I: 0.17 ng/mL — AB (ref <0.031)

## 2016-03-18 LAB — GLUCOSE, CAPILLARY
GLUCOSE-CAPILLARY: 58 mg/dL — AB (ref 65–99)
GLUCOSE-CAPILLARY: 96 mg/dL (ref 65–99)
GLUCOSE-CAPILLARY: 65 mg/dL (ref 65–99)
GLUCOSE-CAPILLARY: 137 mg/dL — AB (ref 65–99)
GLUCOSE-CAPILLARY: 136 mg/dL — AB (ref 65–99)
Glucose-Capillary: 107 mg/dL — ABNORMAL HIGH (ref 65–99)
Glucose-Capillary: 123 mg/dL — ABNORMAL HIGH (ref 65–99)
Glucose-Capillary: 133 mg/dL — ABNORMAL HIGH (ref 65–99)
Glucose-Capillary: 134 mg/dL — ABNORMAL HIGH (ref 65–99)
Glucose-Capillary: 51 mg/dL — ABNORMAL LOW (ref 65–99)
Glucose-Capillary: 76 mg/dL (ref 65–99)

## 2016-03-18 LAB — LACTIC ACID, PLASMA: LACTIC ACID, VENOUS: 2.6 mmol/L — AB (ref 0.5–2.0)

## 2016-03-18 LAB — MAGNESIUM: Magnesium: 2 mg/dL (ref 1.7–2.4)

## 2016-03-18 MED ORDER — CHLORHEXIDINE GLUCONATE 0.12 % MT SOLN: 15.0000 mL | Freq: Two times a day (BID) | OROMUCOSAL | Status: DC

## 2016-03-18 MED ORDER — DEXTROSE 50 % IV SOLN
25.0000 mL | Freq: Once | INTRAVENOUS | Status: AC
  Administered 2016-03-18: 25 mL via INTRAVENOUS

## 2016-03-18 MED ORDER — DEXTROSE 50 % IV SOLN
1.0000 | Freq: Once | INTRAVENOUS | Status: AC
  Administered 2016-03-18: 50 mL via INTRAVENOUS

## 2016-03-18 MED ORDER — MOMETASONE FURO-FORMOTEROL FUM 200-5 MCG/ACT IN AERO
2.0000 | INHALATION_SPRAY | Freq: Two times a day (BID) | RESPIRATORY_TRACT | Status: DC
  Filled 2016-03-18: qty 8.8

## 2016-03-18 MED ORDER — PANTOPRAZOLE SODIUM 40 MG PO TBEC: 40.0000 mg | DELAYED_RELEASE_TABLET | Freq: Two times a day (BID) | ORAL | Status: DC

## 2016-03-18 MED ORDER — HEPARIN SODIUM (PORCINE) 5000 UNIT/ML IJ SOLN
5000.0000 [IU] | Freq: Three times a day (TID) | INTRAMUSCULAR | Status: DC
  Administered 2016-03-18 (×2): 5000 [IU] via SUBCUTANEOUS
  Filled 2016-03-18 (×2): qty 1

## 2016-03-18 MED ORDER — DEXTROSE 50 % IV SOLN
INTRAVENOUS | Status: AC
  Administered 2016-03-18: 50 mL via INTRAVENOUS
  Filled 2016-03-18: qty 50

## 2016-03-18 MED ORDER — LEVOTHYROXINE SODIUM 125 MCG PO TABS
125.0000 ug | ORAL_TABLET | Freq: Every day | ORAL | Status: DC
  Filled 2016-03-18: qty 1

## 2016-03-18 MED ORDER — HYDROCORTISONE NA SUCCINATE PF 100 MG IJ SOLR
50.0000 mg | Freq: Four times a day (QID) | INTRAMUSCULAR | Status: DC
  Administered 2016-03-18 (×3): 50 mg via INTRAVENOUS
  Filled 2016-03-18 (×2): qty 2

## 2016-03-18 MED ORDER — SODIUM CHLORIDE 0.9% FLUSH
3.0000 mL | Freq: Two times a day (BID) | INTRAVENOUS | Status: DC
  Administered 2016-03-18 (×2): 3 mL via INTRAVENOUS

## 2016-03-18 MED ORDER — ONDANSETRON HCL 4 MG/2ML IJ SOLN: 4.0000 mg | Freq: Four times a day (QID) | INTRAMUSCULAR | Status: DC | PRN

## 2016-03-18 MED ORDER — POTASSIUM CHLORIDE 10 MEQ/100ML IV SOLN
10.0000 meq | INTRAVENOUS | Status: AC
  Administered 2016-03-18 – 2016-03-19 (×2): 10 meq via INTRAVENOUS
  Filled 2016-03-18 (×2): qty 100

## 2016-03-18 MED ORDER — SEVELAMER CARBONATE 800 MG PO TABS
800.0000 mg | ORAL_TABLET | Freq: Three times a day (TID) | ORAL | Status: DC
  Filled 2016-03-18: qty 1

## 2016-03-18 MED ORDER — IPRATROPIUM-ALBUTEROL 0.5-2.5 (3) MG/3ML IN SOLN
3.0000 mL | Freq: Four times a day (QID) | RESPIRATORY_TRACT | Status: DC | PRN
  Administered 2016-03-19: 3 mL via RESPIRATORY_TRACT
  Filled 2016-03-18: qty 3

## 2016-03-18 MED ORDER — DEXTROSE 50 % IV SOLN
INTRAVENOUS | Status: AC
  Administered 2016-03-18: 25 mL via INTRAVENOUS
  Filled 2016-03-18: qty 50

## 2016-03-18 MED ORDER — IPRATROPIUM-ALBUTEROL 20-100 MCG/ACT IN AERS: 1.0000 | INHALATION_SPRAY | Freq: Four times a day (QID) | RESPIRATORY_TRACT | Status: DC | PRN

## 2016-03-18 MED ORDER — ONDANSETRON HCL 4 MG PO TABS: 4.0000 mg | ORAL_TABLET | Freq: Four times a day (QID) | ORAL | Status: DC | PRN

## 2016-03-18 MED ORDER — CETYLPYRIDINIUM CHLORIDE 0.05 % MT LIQD: 7.0000 mL | Freq: Two times a day (BID) | OROMUCOSAL | Status: DC

## 2016-03-18 MED ORDER — CALCIUM ACETATE (PHOS BINDER) 667 MG PO CAPS
667.0000 mg | ORAL_CAPSULE | Freq: Three times a day (TID) | ORAL | Status: DC
  Filled 2016-03-18: qty 1

## 2016-03-18 MED ORDER — METOPROLOL TARTRATE 1 MG/ML IV SOLN
2.5000 mg | Freq: Four times a day (QID) | INTRAVENOUS | Status: DC
  Administered 2016-03-19: 2.5 mg via INTRAVENOUS
  Filled 2016-03-18: qty 5

## 2016-03-18 NOTE — ED Notes (Signed)
Pt unable to tolerate [place]. Pt placed back on BiPap.

## 2016-03-18 NOTE — Progress Notes (Signed)
Tx started 

## 2016-03-18 NOTE — Progress Notes (Signed)
Patient ID: [name], female   DOB: 1941/07/22, 75 y.o.   MRN: [id number] [facility] [place] NAME: [name]    MR#:  [id number]  DATE OF BIRTH:  1941/08/27  SUBJECTIVE:   Came in acute on set sob andwas found to be pulm edema. On BIPAP  REVIEW OF SYSTEMS:   Review of Systems  Unable to perform ROS: severe respiratory distress  All other systems reviewed and are negative.  Tolerating Diet:on BIPAP Tolerating PT: pending  DRUG ALLERGIES:   Allergies  Allergen Reactions  . Shellfish-Derived Products Anaphylaxis and Hives    VITALS:  Blood pressure 107/60, pulse 103, temperature 97.3 F (36.3 C), temperature source Axillary, resp. rate 21, height 5\' 4"  (1.626 m), weight 72.8 kg (160 lb 7.9 oz), SpO2 98 %.  PHYSICAL EXAMINATION:   Physical Exam  GENERAL:  75 y.o.-year-old patient lying in the bed with no acute distress. Critically ill EYES: Pupils equal, round, reactive to light and accommodation. No scleral icterus. Extraocular muscles intact.  HEENT: Head atraumatic, normocephalic. Oropharynx and nasopharynx clear.  NECK:  Supple, no jugular venous distention. No thyroid enlargement, no tenderness.  LUNGS: disantl breath sounds bilaterally, no wheezing, rales, rhonchi. No use of accessory muscles of respiration.  CARDIOVASCULAR: S1, S2 normal. No murmurs, rubs, or gallops. Mild tachy ABDOMEN: Soft, nontender, nondistended. Bowel sounds present. No organomegaly or mass.  EXTREMITIES: No cyanosis, clubbing, ++edema b/l.    NEUROLOGIC: Cranial nerves II through XII are intact. No focal Motor or sensory deficits b/l.   PSYCHIATRIC:  patient is alert and oriented SKIN: No obvious rash, lesion, or ulcer.   LABORATORY PANEL:  CBC  Recent Labs Lab 03/18/16 0841  WBC 5.9  HGB 10.0*  HCT 31.1*  PLT 154    Chemistries   Recent Labs Lab 03/12/2016 1835 03/18/16 0841  NA 131* 136  K 4.0 3.5  CL 96* 97*  CO2  22 28  GLUCOSE 65 67  BUN 25* 20  CREATININE 4.79* 4.13*  CALCIUM 7.5* 7.6*  AST 14*  --   ALT 6*  --   ALKPHOS 141*  --   BILITOT 2.5*  --    Cardiac Enzymes  Recent Labs Lab 03/18/16 1812  TROPONINI 0.17*   RADIOLOGY:  Ct Angio Chest Pe W/cm &/or Wo Cm  03/16/2016  CLINICAL DATA:  Increasing weakness and loss of appetite for several days. Shortness of breath and hypoxia. Diarrhea for 1 month. Dialysis patient. History of colon cancer. Left lower quadrant pain. EXAM: CT ANGIOGRAPHY CHEST CT ABDOMEN AND PELVIS WITH CONTRAST TECHNIQUE: Multidetector CT imaging of the chest was performed using the standard protocol during bolus administration of intravenous contrast. Multiplanar CT image reconstructions and MIPs were obtained to evaluate the vascular anatomy. Multidetector CT imaging of the abdomen and pelvis was performed using the standard protocol during bolus administration of intravenous contrast. CONTRAST:  100 mL Isovue 370 COMPARISON:  CT abdomen and pelvis 10/07/2013 FINDINGS: CTA CHEST FINDINGS Technically adequate study with moderately good opacification of the central and segmental pulmonary arteries. No focal filling defects are demonstrated. No evidence of significant pulmonary embolus. Mild cardiac enlargement. Calcifications in the aortic and mitral valves. Calcification in the coronary arteries. Calcification throughout the aorta. Probable calcific stenosis of the origin of the left subclavian artery. No aortic dissection. Mediastinal lymph nodes are not pathologically enlarged. Esophagus is decompressed. Small amount of fluid in the lower mediastinum apparently extends to the diaphragmatic  hiatus. Multiple venous collaterals in the right side of the chest. Apparent stenosis of the origin of the right subclavian vein with collateral reconstitution at the SVC. Evaluation of lungs is limited due to respiratory motion artifact. There are small bilateral pleural effusions with basilar  atelectasis. Somewhat nodular focal consolidation in the left lung base is probably due to atelectasis but an underlying mass is not excluded. Follow-up after resolution of acute process is recommended. Diffuse interstitial pattern and diffuse mosaic appearance to the lungs suggests pulmonary edema. Airways are patent. CT ABDOMEN and PELVIS FINDINGS Large volume diffuse free fluid throughout the abdomen and pelvis. Appearance is consistent with ascites. The this is new since the previous study. The liver, spleen, gallbladder, pancreas, adrenal glands, inferior vena cava, and retroperitoneal lymph nodes are unremarkable. Prominent diffuse calcification of the abdominal aorta and branch vessel origins. There is likely calcific stenosis of the iliac arteries bilaterally with occlusion of the left internal iliac artery. No aneurysm. Stomach, small bowel, and colon are not abnormally distended. No free air in the abdomen. Diffuse edema throughout the subcutaneous fat and soft tissues. Kidneys are markedly atrophic bilaterally with small cysts appreciated. No hydronephrosis. Pelvis: Bladder is decompressed and cannot be evaluated. Uterus is surgically absent. Appendix appears surgically absent. No pelvic mass or lymphadenopathy. No destructive bone lesions. Review of the MIP images confirms the above findings. IMPRESSION: Chest: No evidence of significant pulmonary embolus. Cardiac enlargement with pulmonary vascular congestion and diffuse pulmonary edema. Small bilateral pleural effusions. Atelectasis or consolidation in the lung bases. Focal nodular consolidation in the left lung base is probably due to atelectasis but underlying mass lesion is not excluded and followup after resolution of acute process is suggested. 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 Multiple right chest wall collaterals. Probable high-grade stenosis of the left subclavian artery. Abdomen: Large volume  diffuse ascites throughout the abdomen and pelvis. This is new since previous study. Diffuse soft tissue edema. Prominent vascular calcifications. Electronically Signed   By: Lucienne Capers M.D.   On: 03/06/2016 21:05   Ct Abdomen Pelvis W Contrast  03/08/2016  CLINICAL DATA:  Increasing weakness and loss of appetite for several days. Shortness of breath and hypoxia. Diarrhea for 1 month. Dialysis patient. History of colon cancer. Left lower quadrant pain. EXAM: CT ANGIOGRAPHY CHEST CT ABDOMEN AND PELVIS WITH CONTRAST TECHNIQUE: Multidetector CT imaging of the chest was performed using the standard protocol during bolus administration of intravenous contrast. Multiplanar CT image reconstructions and MIPs were obtained to evaluate the vascular anatomy. Multidetector CT imaging of the abdomen and pelvis was performed using the standard protocol during bolus administration of intravenous contrast. CONTRAST:  100 mL Isovue 370 COMPARISON:  CT abdomen and pelvis 10/07/2013 FINDINGS: CTA CHEST FINDINGS Technically adequate study with moderately good opacification of the central and segmental pulmonary arteries. No focal filling defects are demonstrated. No evidence of significant pulmonary embolus. Mild cardiac enlargement. Calcifications in the aortic and mitral valves. Calcification in the coronary arteries. Calcification throughout the aorta. Probable calcific stenosis of the origin of the left subclavian artery. No aortic dissection. Mediastinal lymph nodes are not pathologically enlarged. Esophagus is decompressed. Small amount of fluid in the lower mediastinum apparently extends to the diaphragmatic hiatus. Multiple venous collaterals in the right side of the chest. Apparent stenosis of the origin of the right subclavian vein with collateral reconstitution at the SVC. Evaluation of lungs is limited due to respiratory motion artifact. There are small bilateral  pleural effusions with basilar atelectasis. Somewhat  nodular focal consolidation in the left lung base is probably due to atelectasis but an underlying mass is not excluded. Follow-up after resolution of acute process is recommended. Diffuse interstitial pattern and diffuse mosaic appearance to the lungs suggests pulmonary edema. Airways are patent. CT ABDOMEN and PELVIS FINDINGS Large volume diffuse free fluid throughout the abdomen and pelvis. Appearance is consistent with ascites. The this is new since the previous study. The liver, spleen, gallbladder, pancreas, adrenal glands, inferior vena cava, and retroperitoneal lymph nodes are unremarkable. Prominent diffuse calcification of the abdominal aorta and branch vessel origins. There is likely calcific stenosis of the iliac arteries bilaterally with occlusion of the left internal iliac artery. No aneurysm. Stomach, small bowel, and colon are not abnormally distended. No free air in the abdomen. Diffuse edema throughout the subcutaneous fat and soft tissues. Kidneys are markedly atrophic bilaterally with small cysts appreciated. No hydronephrosis. Pelvis: Bladder is decompressed and cannot be evaluated. Uterus is surgically absent. Appendix appears surgically absent. No pelvic mass or lymphadenopathy. No destructive bone lesions. Review of the MIP images confirms the above findings. IMPRESSION: Chest: No evidence of significant pulmonary embolus. Cardiac enlargement with pulmonary vascular congestion and diffuse pulmonary edema. Small bilateral pleural effusions. Atelectasis or consolidation in the lung bases. Focal nodular consolidation in the left lung base is probably due to atelectasis but underlying mass lesion is not excluded and followup after resolution of acute process is suggested. 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 Multiple right chest wall collaterals. Probable high-grade stenosis of the left subclavian artery. Abdomen: Large volume diffuse ascites  throughout the abdomen and pelvis. This is new since previous study. Diffuse soft tissue edema. Prominent vascular calcifications. Electronically Signed   By: Lucienne Capers M.D.   On: 03/22/2016 21:05   Dg Chest Port 1 View  03/04/2016  CLINICAL DATA:  Worsening weakness. Diarrhea. Shortness of breath. Atrial fibrillation. COPD. Congestive heart failure. EXAM: PORTABLE CHEST 1 VIEW COMPARISON:  09/02/2015 FINDINGS: Cardiomegaly is again noted. Decreased lung volumes since prior study. Bilateral pleural- parenchymal scarring again seen, right side greater than left. No evidence of pulmonary consolidation or edema. No definite evidence of pleural effusion or pneumothorax. IMPRESSION: Stable cardiomegaly. Low lung volumes with no significant change in bilateral pleural-parenchymal scarring. No acute findings. Electronically Signed   By: Earle Gell M.D.   On: 03/03/2016 18:57   ASSESSMENT AND PLAN:  Congetta Reihl is a 75 y.o. female who presents with Acute onset dyspnea and then over shortness of breath. She states that this happened today while she was up and about trying to clean. When she arrived to the ED she was hypoxic requiring BiPAP. Chest CT did not show any PE, though it did show significant pulmonary edema and vascular congestion  1.Acute hypoxic respiratory failure (HCC) - due to pulmonary edema and vascular congestion.  -This is likely due to some fluid overload.  -Patient oxygenating well on BiPAP, and feels more comfortable. Continued until able to wean as tolerated.  -received urgent HD last nite with UF of 2000 ml -she remians a DNR.   2. ESRD (end stage renal disease) on dialysis [facility]) - now with likely fluid overload in conjunction with CHF flare. See below for CHF treatment. Nephrology consulted for dialysis session.  3.COPD (chronic obstructive pulmonary disease) ([place]) - continue home inhalers, wean BiPAP as above  4. Acute on chronic diastolic (congestive) heart failure (HCC)  - currently  on BiPAP, wean as above. HD session for fluid overload. We'll trend cardiac enzymes, and order an echocardiogram and cardiology consult.  5. HTN (hypertension) - borderline low. Hold home antihypertensives for now.  6.Hypothyroidism - continue home dose thyroid replacement   Case discussed with Care Management/Social Worker. Management plans discussed with the patient, family and they are in agreement.  CODE STATUS: DNR  DVT Prophylaxis: heparin  TOTAL critical TIME TAKING CARE OF THIS PATIENT: 21minutes.  >50% time spent on counselling and coordination of care  POSSIBLE D/C IN 2-3 DAYS, DEPENDING ON CLINICAL CONDITION.  Note: This dictation was prepared with Dragon dictation along with smaller phrase technology. Any transcriptional errors that result from this process are unintentional.  Nai Dasch M.D on 03/18/2016 at 7:56 PM  Between 7am to 6pm - Pager - [phone]  After 6pm go to [email or web address] - password [email or web address] [facility]  Office  [phone]  CC: Primary care physician; Madelyn Brunner, MD

## 2016-03-18 NOTE — Progress Notes (Signed)
Pt received from ED, on Bipap 100%. No apparent distress. Pt hooked up to our monitor. BP low, systolic 89. BS 59, prepared 1/2 amp of Dextrose. Report given to [facility], Therapist, sports.

## 2016-03-18 NOTE — Progress Notes (Signed)
Discussed with patient in presence of daughter, she is on nearly maximum support on bipap with continue dyspnea and low normal oxygen sats. If she worsens she will require intubation. She states firmly that she does not want life support or resuscitation.  She understands that she may pass away. Her daughter at bedside affirms that "she has always said that" and they were in agreement. Will enter order for DNR.   [name].  03/18/2016

## 2016-03-18 NOTE — Progress Notes (Signed)
*  PRELIMINARY RESULTS* Echocardiogram 2D Echocardiogram has been performed.  [name] 03/18/2016, 2:00 PM

## 2016-03-18 NOTE — Progress Notes (Signed)
PRE HD   

## 2016-03-18 NOTE — Progress Notes (Signed)
eLink Physician-Brief Progress Note Patient Name: [name] DOB: [birth year] MRN: [id number]   Date of Service  03/18/2016  HPI/Events of Note  Potassium 3.8. Patient on home metoprolol twice a day.  eICU Interventions  1. Potassium chloride 10 mEq IV 2 runs 2. Lopressor 2.5 mg IV every 6 hours with hold parameters     Intervention Category Intermediate Interventions: Electrolyte abnormality - evaluation and management  Tera Partridge 03/18/2016, 10:48 PM

## 2016-03-18 NOTE — Progress Notes (Signed)
[facility]  ROUNDING NOTE   Subjective:  Patient well known to from prior admissions. She originally presented with diarrhea, nausea, and left-sided abdominal pain. She was also found to have increasing shortness of breath. CT scan of the chest was negative for PE but did reveal pulmonary edema. She was also found to have new onset ascites on CT scan of the abdomen and pelvis. Patient was initially on BiPAP tonight. She then had hemodialysis urgently which she tolerated well.   Objective:  Vital signs in last 24 hours:  Temp:  [97.8 F (36.6 C)-97.9 F (36.6 C)] 97.8 F (36.6 C) (03/23 0412) Pulse Rate:  [108-115] 115 (03/23 0412) Resp:  [16-30] 19 (03/23 0412) BP: (84-114)/(6-78) 87/72 mmHg (03/23 0412) SpO2:  [85 %-100 %] 100 % (03/23 0407) Weight:  [72.576 kg (160 lb)-74.8 kg (164 lb 14.5 oz)] 72.8 kg (160 lb 7.9 oz) (03/23 0407)  Weight change:  Filed Weights   03/18/2016 2001 03/18/16 0150 03/18/16 0407  Weight: 74.8 kg (164 lb 14.5 oz) 74.8 kg (164 lb 14.5 oz) 72.8 kg (160 lb 7.9 oz)    Intake/Output:     Intake/Output this shift:  Total I/O In: -  Out: 2000 [Other:2000]  Physical Exam: General: Mild respiratory distress  Head: Normocephalic, atraumatic. Moist oral mucosal membranes  Eyes: Anicteric  Neck: Supple, trachea midline  Lungs:  Basilar rales, normal effort  Heart: S1S2 irregular, tachycardic  Abdomen:  Soft, nontender, distended  Extremities: 2+ peripheral edema.  Neurologic: Nonfocal, moving all four extremities  Skin: No lesions  Access: LUE Access    Basic Metabolic Panel:  Recent Labs Lab 02/25/2016 1835  NA 131*  K 4.0  CL 96*  CO2 22  GLUCOSE 65  BUN 25*  CREATININE 4.79*  CALCIUM 7.5*    Liver Function Tests:  Recent Labs Lab 03/18/2016 1835  AST 14*  ALT 6*  ALKPHOS 141*  BILITOT 2.5*  PROT 7.4  ALBUMIN 3.1*    Recent Labs Lab 02/27/2016 1835  LIPASE 26   No results for input(s): AMMONIA in the last  168 hours.  CBC:  Recent Labs Lab 03/21/2016 1835  WBC 4.7  NEUTROABS 3.5  HGB 11.0*  HCT 34.6*  MCV 90.0  PLT 163    Cardiac Enzymes:  Recent Labs Lab 03/12/2016 1835  TROPONINI 0.21*    BNP: Invalid input(s): POCBNP  CBG: No results for input(s): GLUCAP in the last 168 hours.  Microbiology: Results for orders placed or performed during the hospital encounter of 03/08/2016  Rapid Influenza A&B Antigens ([place] only)     Status: None   Collection Time: 03/25/2016 11:47 PM  Result Value Ref Range Status   Influenza A ([place]) NEGATIVE NEGATIVE Final   Influenza B ([facility]) NEGATIVE NEGATIVE Final    Coagulation Studies:  Recent Labs  03/23/2016 1835  LABPROT 17.6*  INR 1.44    Urinalysis: No results for input(s): COLORURINE, LABSPEC, PHURINE, GLUCOSEU, HGBUR, BILIRUBINUR, KETONESUR, PROTEINUR, UROBILINOGEN, NITRITE, LEUKOCYTESUR in the last 72 hours.  Invalid input(s): APPERANCEUR    Imaging: Ct Angio Chest Pe W/cm &/or Wo Cm  03/15/2016  CLINICAL DATA:  Increasing weakness and loss of appetite for several days. Shortness of breath and hypoxia. Diarrhea for 1 month. Dialysis patient. History of colon cancer. Left lower quadrant pain. EXAM: CT ANGIOGRAPHY CHEST CT ABDOMEN AND PELVIS WITH CONTRAST TECHNIQUE: Multidetector CT imaging of the chest was performed using the standard protocol during bolus administration of intravenous contrast. Multiplanar CT image reconstructions  and MIPs were obtained to evaluate the vascular anatomy. Multidetector CT imaging of the abdomen and pelvis was performed using the standard protocol during bolus administration of intravenous contrast. CONTRAST:  100 mL Isovue 370 COMPARISON:  CT abdomen and pelvis 10/07/2013 FINDINGS: CTA CHEST FINDINGS Technically adequate study with moderately good opacification of the central and segmental pulmonary arteries. No focal filling defects are demonstrated. No evidence of significant pulmonary embolus. Mild  cardiac enlargement. Calcifications in the aortic and mitral valves. Calcification in the coronary arteries. Calcification throughout the aorta. Probable calcific stenosis of the origin of the left subclavian artery. No aortic dissection. Mediastinal lymph nodes are not pathologically enlarged. Esophagus is decompressed. Small amount of fluid in the lower mediastinum apparently extends to the diaphragmatic hiatus. Multiple venous collaterals in the right side of the chest. Apparent stenosis of the origin of the right subclavian vein with collateral reconstitution at the SVC. Evaluation of lungs is limited due to respiratory motion artifact. There are small bilateral pleural effusions with basilar atelectasis. Somewhat nodular focal consolidation in the left lung base is probably due to atelectasis but an underlying mass is not excluded. Follow-up after resolution of acute process is recommended. Diffuse interstitial pattern and diffuse mosaic appearance to the lungs suggests pulmonary edema. Airways are patent. CT ABDOMEN and PELVIS FINDINGS Large volume diffuse free fluid throughout the abdomen and pelvis. Appearance is consistent with ascites. The this is new since the previous study. The liver, spleen, gallbladder, pancreas, adrenal glands, inferior vena cava, and retroperitoneal lymph nodes are unremarkable. Prominent diffuse calcification of the abdominal aorta and branch vessel origins. There is likely calcific stenosis of the iliac arteries bilaterally with occlusion of the left internal iliac artery. No aneurysm. Stomach, small bowel, and colon are not abnormally distended. No free air in the abdomen. Diffuse edema throughout the subcutaneous fat and soft tissues. Kidneys are markedly atrophic bilaterally with small cysts appreciated. No hydronephrosis. Pelvis: Bladder is decompressed and cannot be evaluated. Uterus is surgically absent. Appendix appears surgically absent. No pelvic mass or lymphadenopathy.  No destructive bone lesions. Review of the MIP images confirms the above findings. IMPRESSION: Chest: No evidence of significant pulmonary embolus. Cardiac enlargement with pulmonary vascular congestion and diffuse pulmonary edema. Small bilateral pleural effusions. Atelectasis or consolidation in the lung bases. Focal nodular consolidation in the left lung base is probably due to atelectasis but underlying mass lesion is not excluded and followup after resolution of acute process is suggested. 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 Multiple right chest wall collaterals. Probable high-grade stenosis of the left subclavian artery. Abdomen: Large volume diffuse ascites throughout the abdomen and pelvis. This is new since previous study. Diffuse soft tissue edema. Prominent vascular calcifications. Electronically Signed   By: Lucienne Capers M.D.   On: 03/02/2016 21:05   Ct Abdomen Pelvis W Contrast  03/21/2016  CLINICAL DATA:  Increasing weakness and loss of appetite for several days. Shortness of breath and hypoxia. Diarrhea for 1 month. Dialysis patient. History of colon cancer. Left lower quadrant pain. EXAM: CT ANGIOGRAPHY CHEST CT ABDOMEN AND PELVIS WITH CONTRAST TECHNIQUE: Multidetector CT imaging of the chest was performed using the standard protocol during bolus administration of intravenous contrast. Multiplanar CT image reconstructions and MIPs were obtained to evaluate the vascular anatomy. Multidetector CT imaging of the abdomen and pelvis was performed using the standard protocol during bolus administration of intravenous contrast. CONTRAST:  100 mL Isovue 370 COMPARISON:  CT abdomen and pelvis  10/07/2013 FINDINGS: CTA CHEST FINDINGS Technically adequate study with moderately good opacification of the central and segmental pulmonary arteries. No focal filling defects are demonstrated. No evidence of significant pulmonary embolus. Mild cardiac enlargement.  Calcifications in the aortic and mitral valves. Calcification in the coronary arteries. Calcification throughout the aorta. Probable calcific stenosis of the origin of the left subclavian artery. No aortic dissection. Mediastinal lymph nodes are not pathologically enlarged. Esophagus is decompressed. Small amount of fluid in the lower mediastinum apparently extends to the diaphragmatic hiatus. Multiple venous collaterals in the right side of the chest. Apparent stenosis of the origin of the right subclavian vein with collateral reconstitution at the SVC. Evaluation of lungs is limited due to respiratory motion artifact. There are small bilateral pleural effusions with basilar atelectasis. Somewhat nodular focal consolidation in the left lung base is probably due to atelectasis but an underlying mass is not excluded. Follow-up after resolution of acute process is recommended. Diffuse interstitial pattern and diffuse mosaic appearance to the lungs suggests pulmonary edema. Airways are patent. CT ABDOMEN and PELVIS FINDINGS Large volume diffuse free fluid throughout the abdomen and pelvis. Appearance is consistent with ascites. The this is new since the previous study. The liver, spleen, gallbladder, pancreas, adrenal glands, inferior vena cava, and retroperitoneal lymph nodes are unremarkable. Prominent diffuse calcification of the abdominal aorta and branch vessel origins. There is likely calcific stenosis of the iliac arteries bilaterally with occlusion of the left internal iliac artery. No aneurysm. Stomach, small bowel, and colon are not abnormally distended. No free air in the abdomen. Diffuse edema throughout the subcutaneous fat and soft tissues. Kidneys are markedly atrophic bilaterally with small cysts appreciated. No hydronephrosis. Pelvis: Bladder is decompressed and cannot be evaluated. Uterus is surgically absent. Appendix appears surgically absent. No pelvic mass or lymphadenopathy. No destructive bone  lesions. Review of the MIP images confirms the above findings. IMPRESSION: Chest: No evidence of significant pulmonary embolus. Cardiac enlargement with pulmonary vascular congestion and diffuse pulmonary edema. Small bilateral pleural effusions. Atelectasis or consolidation in the lung bases. Focal nodular consolidation in the left lung base is probably due to atelectasis but underlying mass lesion is not excluded and followup after resolution of acute process is suggested. 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 Multiple right chest wall collaterals. Probable high-grade stenosis of the left subclavian artery. Abdomen: Large volume diffuse ascites throughout the abdomen and pelvis. This is new since previous study. Diffuse soft tissue edema. Prominent vascular calcifications. Electronically Signed   By: Lucienne Capers M.D.   On: 03/07/2016 21:05   Dg Chest Port 1 View  03/06/2016  CLINICAL DATA:  Worsening weakness. Diarrhea. Shortness of breath. Atrial fibrillation. COPD. Congestive heart failure. EXAM: PORTABLE CHEST 1 VIEW COMPARISON:  09/02/2015 FINDINGS: Cardiomegaly is again noted. Decreased lung volumes since prior study. Bilateral pleural- parenchymal scarring again seen, right side greater than left. No evidence of pulmonary consolidation or edema. No definite evidence of pleural effusion or pneumothorax. IMPRESSION: Stable cardiomegaly. Low lung volumes with no significant change in bilateral pleural-parenchymal scarring. No acute findings. Electronically Signed   By: Earle Gell M.D.   On: 03/15/2016 18:57     Medications:       Assessment/ Plan:  75 y.o. female with end-stage renal disease on HD since 2002, hypertension, hypothyroidism, emphysema, AOCD, secondary hyperparathyroidism, colonic perforation s/p repair 12/05/12, colon cancer status post colectomy   Admitted for pulmonary edema on 03/13/2016.  [facility] [place]  first  shift  1. End-stage renal disease on hemodialysis Tuesday, Thursday, Saturday. The patient presented in acute respiratory distress and was on BiPAP initially. Hemodialysis was performed and 2 kg of ultrafiltration was performed. She is still having some respiratory distress and may need to go back on BiPAP.  2. Acute respiratory failure. She presented with hypoxemia and pulmonary edema. Patient underwent dialysis urgently and ultrafiltration of 2 kg was performed. Patient still short of breath and having low O2 saturation. Recommend going back on BiPAP.  3. Anemia of chronic kidney disease. Hemoglobin currently 11.0. Hold off on her thyroid-stimulating agents.  4. Secondary hyperparathyroidism. Check intact PTH and phosphorus.  5. New onset ascites. Patient with history of colon cancer. Further workup per hospitalist.  LOS: 1 Mata Rowen 3/23/20175:02 AM

## 2016-03-18 NOTE — Progress Notes (Signed)
eLink Physician-Brief Progress Note Patient Name: [name] DOB: [birth year] MRN: [id number]   Date of Service  03/18/2016  HPI/Events of Note  Bedside nurse reports 6 [place] of wide complex tachycardia. Patient asymptomatic during this.End-stage renal patient on hemodialysis.  eICU Interventions  Stat renal panel & magnesium. Replacement as indicated. Continue to monitor on telemetry.     Intervention Category Intermediate Interventions: Arrhythmia - evaluation and management  Tera Partridge 03/18/2016, 8:21 PM

## 2016-03-18 NOTE — Consult Note (Signed)
[facility] Consultation     ASSESSMENT/PLAN   The patient is a 75 year old female with end-stage renal disease on hemodialysis, oxygen dependent COPD, diastolic congestive heart failure, pulmonary hypertension. She presents with pulmonary edema, currently on BiPAP.  PULMONARY  ASevere hypoxic respiratory failure, with pulmonary edema. -Possibility of ARDS. -Severe COPD, oxygen dependent. Doubt exacerbation. P:   -Continue dialysis for fluid removal. The patient is an uric therefore on diuresis would be of little benefit. -Continue BiPAP, settings changed from 10/5, 2:15/10. This should should help with the pulmonary edema.  CARDIOVASCULAR Diastolic congestive heart failure, possible CHF exacerbation. Volume overload. Pulmonary hypertension secondary to above. P:  Continue blood pressure control, continue fluid removal via dialysis.  RENAL A:  End-stage renal disease on hemodialysis. P:   Continue hemodialysis for fluid removal.  GASTROINTESTINAL A:  Advance diet as tolerated, currently nothing by mouth due to BiPAP requirements.  HEMATOLOGIC A: History of autoimmune thrombocytopenia.  INFECTIOUS A:  History of Serratia and Citrobacter in urine. P:   Influenza screen negative. 3/22. Blood cultures. 3/22:: Negative thus far.   ENDOCRINE A:  Hypoglycemia. P:   Could not be started on D5 drip due to volume overload status, will start the steroids.  NEUROLOGIC A:  --    ---------------------------------------  ---------------------------------------   Name: [name] MRN: [id number] DOB: July 19, 1941    ADMISSION DATE:  03/08/2016 CONSULTATION DATE:  03/18/16  REFERRING MD :  Dr. Jannifer Franklin  CHIEF COMPLAINT:  Dyspnea.    HISTORY OF PRESENT ILLNESS:    The patient is a 75 year old female with a history of end-stage renal disease. Currently the patient is on full facial BiPAP, therefore, all history was obtained from the chart and  from staff. The patient has a known history of pulmonary hypertension with a PA systolic pressure 51 mmHg. 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 She has a history of Citrobacter and Serratia in urine. Currently, the patient was admitted with report of diarrhea over the last 1 month with progression. The patient is typically on dialysis Tuesday, Thursday and Saturday via a left AV fistula. She also apparently has a history of COPD on home oxygen at 2-3 L. Upon her arrival in the emergency room, she was requiring a nonrebreather.  CT of the chest images and report reviewed, this showed changes consistent with pulmonary edema, small bilateral pleural effusions, ascites. The patient has subsequent been switched to BiPAP 10/5, 100% FiO2 with oxygen saturation between 90 and 100%.  PAST MEDICAL HISTORY :  Past Medical History  Diagnosis Date  . Hypertension   . Hyperlipidemia   . Thyroid disease     Hypothyroidism  . ESRD (end stage renal disease) on dialysis ([place])   . Autoimmune thrombocytopenia ([place])   . COPD (chronic obstructive pulmonary disease) ([place])   . Diverticulitis 2013 sept.      hospitalized  at [place].  . Anginal pain ([place])   . Hypothyroidism   . Shortness of breath   . Pneumonia   . Chronic diastolic CHF (congestive heart failure) ([place])   . GERD (gastroesophageal reflux disease)   . Arthritis   . Asthma   . Cancer [facility])    Past Surgical History  Procedure Laterality Date  . Av fistula placement, brachiocephalic  [id number]    right arm  . Abdominal hysterectomy    . Colonoscopy  Dec 2013    colon cancer .. surgery  to be 2014  . Shuntogram  Left 05/28/2013    Procedure: Earney Mallet;  Surgeon: Conrad Winchester, MD;  Location: [facility] CATH LAB;  Service: Cardiovascular;  Laterality: Left;  . Peripheral vascular catheterization Left 06/13/2015    Procedure: A/V Shuntogram/Fistulagram;  Surgeon: Katha Cabal, MD;  Location: [facility] CV LAB;  Service:  Cardiovascular;  Laterality: Left;  . Peripheral vascular catheterization Left 06/13/2015    Procedure: A/V Shunt Intervention;  Surgeon: Katha Cabal, MD;  Location: [facility] CV LAB;  Service: Cardiovascular;  Laterality: Left;  . Colon surgery    . Peripheral vascular catheterization N/A 11/12/2015    Procedure: A/V Shuntogram/Fistulagram;  Surgeon: Katha Cabal, MD;  Location: [facility] CV LAB;  Service: Cardiovascular;  Laterality: N/A;  . Peripheral vascular catheterization N/A 11/12/2015    Procedure: A/V Shunt Intervention;  Surgeon: Katha Cabal, MD;  Location: [facility] CV LAB;  Service: Cardiovascular;  Laterality: N/A;   Prior to Admission medications   Medication Sig Start Date End Date Taking? Authorizing Provider  acetaminophen (TYLENOL) 500 MG tablet Take 1,000 mg by mouth every 4 (four) hours as needed for mild pain or headache.    Yes Historical Provider, MD  b complex-vitamin c-folic acid (NEPHRO-VITE) 0.8 MG TABS tablet Take 1 tablet by mouth at bedtime. 05/06/15  Yes Henreitta Leber, MD  budesonide-formoterol (SYMBICORT) 160-4.5 MCG/ACT inhaler Inhale 2 puffs into the lungs 2 (two) times daily.   Yes Historical Provider, MD  calcium acetate (PHOSLO) 667 MG capsule Take 667 mg by mouth 3 (three) times daily with meals.   Yes Historical Provider, MD  diphenhydrAMINE (BENADRYL) 25 mg capsule Take 25 mg by mouth 3 (three) times daily as needed for itching or sleep.    Yes Historical Provider, MD  Ipratropium-Albuterol (COMBIVENT RESPIMAT) 20-100 MCG/ACT AERS respimat Inhale 1 puff into the lungs every 6 (six) hours as needed for wheezing or shortness of breath.   Yes Historical Provider, MD  isosorbide mononitrate (IMDUR) 60 MG 24 hr tablet Take 60 mg by mouth daily.    Yes Historical Provider, MD  levothyroxine (SYNTHROID, LEVOTHROID) 125 MCG tablet Take 125 mcg by mouth daily before breakfast.    Yes Historical Provider, MD  metoprolol tartrate  (LOPRESSOR) 25 MG tablet Take 25 mg by mouth 2 (two) times daily.   Yes Historical Provider, MD  ondansetron (ZOFRAN) 4 MG tablet Take 4 mg by mouth every 8 (eight) hours as needed for nausea or vomiting.   Yes Historical Provider, MD  pantoprazole (PROTONIX) 40 MG tablet Take 40 mg by mouth 2 (two) times daily.   Yes Historical Provider, MD  sevelamer (RENVELA) 800 MG tablet Take 800 mg by mouth 3 (three) times daily with meals.    Yes Historical Provider, MD   Allergies  Allergen Reactions  . Shellfish-Derived Products Anaphylaxis and Hives    FAMILY HISTORY:  Family History  Problem Relation Age of Onset  . Heart disease Mother   . Cancer Father   . Kidney disease Brother    SOCIAL HISTORY:  reports that she quit smoking about 17 years ago. Her smoking use included Cigarettes. She has a 10 pack-year smoking history. She has never used smokeless tobacco. She reports that she does not drink alcohol or use illicit drugs.  REVIEW OF SYSTEMS:   Could not be obtained as the patient is currently on a full facial BiPAP.   VITAL SIGNS: Temp:  [97.8 F (36.6 C)-97.9 F (36.6 C)] 97.8 F (36.6 C) (03/23 0412) Pulse  Rate:  [45-117] 108 (03/23 0637) Resp:  [16-30] 23 (03/23 0637) BP: (67-114)/(6-78) 92/66 mmHg (03/23 0637) SpO2:  [74 %-100 %] 98 % (03/23 0754) Weight:  [160 lb (72.576 kg)-164 lb 14.5 oz (74.8 kg)] 160 lb 7.9 oz (72.8 kg) (03/23 0407) HEMODYNAMICS:   VENTILATOR SETTINGS:   INTAKE / OUTPUT:  Intake/Output Summary (Last 24 hours) at 03/18/16 1124 Last data filed at 03/18/16 0407  Gross per 24 hour  Intake      0 ml  Output   2000 ml  Net  -2000 ml    Physical Examination:   VS: BP 92/66 mmHg  Pulse 108  Temp(Src) 97.8 F (36.6 C) (Axillary)  Resp 23  Ht 5\' 4"  (1.626 m)  Wt 160 lb 7.9 oz (72.8 kg)  BMI 27.54 kg/m2  SpO2 98%  General Appearance: No distress  Neuro:without focal findings, mental statusNormal. HEENT: PERRLA, EOM intact, no ptosis,    Pulmonary: Bilateral coarse breath sounds.   CardiovascularNormal S1,S2.  No m/r/g.    Abdomen: Benign, Soft, non-tender, No masses, hepatosplenomegaly, No lymphadenopathy Renal:  No costovertebral tenderness  GU:  Not performed at this time. Endoc: No evident thyromegaly, no signs of acromegaly. Skin:   warm, no rashes, no ecchymosis  Extremities: normal, no cyanosis, clubbing, no edema, warm with normal capillary refill.    LABS: Reviewed   LABORATORY PANEL:   CBC  Recent Labs Lab 03/18/16 0841  WBC 5.9  HGB 10.0*  HCT 31.1*  PLT 154    Chemistries   Recent Labs Lab 03/01/2016 1835 03/18/16 0841  NA 131* 136  K 4.0 3.5  CL 96* 97*  CO2 22 28  GLUCOSE 65 67  BUN 25* 20  CREATININE 4.79* 4.13*  CALCIUM 7.5* 7.6*  AST 14*  --   ALT 6*  --   ALKPHOS 141*  --   BILITOT 2.5*  --      Recent Labs Lab 03/18/16 0655 03/18/16 0722 03/18/16 0951 03/18/16 1032  GLUCAP 58* 96 51* 107*    Recent Labs Lab 03/18/2016 1833 03/10/2016 2210  PHART 7.45 7.42  PCO2ART 43 39  PO2ART 23* 64*    Recent Labs Lab 03/26/2016 1835  AST 14*  ALT 6*  ALKPHOS 141*  BILITOT 2.5*  ALBUMIN 3.1*    Cardiac Enzymes  Recent Labs Lab 03/18/16 0841  TROPONINI 0.14*    RADIOLOGY:  Ct Angio Chest Pe W/cm &/or Wo Cm  03/18/2016  CLINICAL DATA:  Increasing weakness and loss of appetite for several days. Shortness of breath and hypoxia. Diarrhea for 1 month. Dialysis patient. History of colon cancer. Left lower quadrant pain. EXAM: CT ANGIOGRAPHY CHEST CT ABDOMEN AND PELVIS WITH CONTRAST TECHNIQUE: Multidetector CT imaging of the chest was performed using the standard protocol during bolus administration of intravenous contrast. Multiplanar CT image reconstructions and MIPs were obtained to evaluate the vascular anatomy. Multidetector CT imaging of the abdomen and pelvis was performed using the standard protocol during bolus administration of intravenous contrast. CONTRAST:  100  mL Isovue 370 COMPARISON:  CT abdomen and pelvis 10/07/2013 FINDINGS: CTA CHEST FINDINGS Technically adequate study with moderately good opacification of the central and segmental pulmonary arteries. No focal filling defects are demonstrated. No evidence of significant pulmonary embolus. Mild cardiac enlargement. Calcifications in the aortic and mitral valves. Calcification in the coronary arteries. Calcification throughout the aorta. Probable calcific stenosis of the origin of the left subclavian artery. No aortic dissection. Mediastinal lymph nodes are not pathologically enlarged. Esophagus  is decompressed. Small amount of fluid in the lower mediastinum apparently extends to the diaphragmatic hiatus. Multiple venous collaterals in the right side of the chest. Apparent stenosis of the origin of the right subclavian vein with collateral reconstitution at the SVC. Evaluation of lungs is limited due to respiratory motion artifact. There are small bilateral pleural effusions with basilar atelectasis. Somewhat nodular focal consolidation in the left lung base is probably due to atelectasis but an underlying mass is not excluded. Follow-up after resolution of acute process is recommended. Diffuse interstitial pattern and diffuse mosaic appearance to the lungs suggests pulmonary edema. Airways are patent. CT ABDOMEN and PELVIS FINDINGS Large volume diffuse free fluid throughout the abdomen and pelvis. Appearance is consistent with ascites. The this is new since the previous study. The liver, spleen, gallbladder, pancreas, adrenal glands, inferior vena cava, and retroperitoneal lymph nodes are unremarkable. Prominent diffuse calcification of the abdominal aorta and branch vessel origins. There is likely calcific stenosis of the iliac arteries bilaterally with occlusion of the left internal iliac artery. No aneurysm. Stomach, small bowel, and colon are not abnormally distended. No free air in the abdomen. Diffuse edema  throughout the subcutaneous fat and soft tissues. Kidneys are markedly atrophic bilaterally with small cysts appreciated. No hydronephrosis. Pelvis: Bladder is decompressed and cannot be evaluated. Uterus is surgically absent. Appendix appears surgically absent. No pelvic mass or lymphadenopathy. No destructive bone lesions. Review of the MIP images confirms the above findings. IMPRESSION: Chest: No evidence of significant pulmonary embolus. Cardiac enlargement with pulmonary vascular congestion and diffuse pulmonary edema. Small bilateral pleural effusions. Atelectasis or consolidation in the lung bases. Focal nodular consolidation in the left lung base is probably due to atelectasis but underlying mass lesion is not excluded and followup after resolution of acute process is suggested. 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 Multiple right chest wall collaterals. Probable high-grade stenosis of the left subclavian artery. Abdomen: Large volume diffuse ascites throughout the abdomen and pelvis. This is new since previous study. Diffuse soft tissue edema. Prominent vascular calcifications. Electronically Signed   By: Lucienne Capers M.D.   On: 03/12/2016 21:05   Ct Abdomen Pelvis W Contrast  03/02/2016  CLINICAL DATA:  Increasing weakness and loss of appetite for several days. Shortness of breath and hypoxia. Diarrhea for 1 month. Dialysis patient. History of colon cancer. Left lower quadrant pain. EXAM: CT ANGIOGRAPHY CHEST CT ABDOMEN AND PELVIS WITH CONTRAST TECHNIQUE: Multidetector CT imaging of the chest was performed using the standard protocol during bolus administration of intravenous contrast. Multiplanar CT image reconstructions and MIPs were obtained to evaluate the vascular anatomy. Multidetector CT imaging of the abdomen and pelvis was performed using the standard protocol during bolus administration of intravenous contrast. CONTRAST:  100 mL Isovue 370  COMPARISON:  CT abdomen and pelvis 10/07/2013 FINDINGS: CTA CHEST FINDINGS Technically adequate study with moderately good opacification of the central and segmental pulmonary arteries. No focal filling defects are demonstrated. No evidence of significant pulmonary embolus. Mild cardiac enlargement. Calcifications in the aortic and mitral valves. Calcification in the coronary arteries. Calcification throughout the aorta. Probable calcific stenosis of the origin of the left subclavian artery. No aortic dissection. Mediastinal lymph nodes are not pathologically enlarged. Esophagus is decompressed. Small amount of fluid in the lower mediastinum apparently extends to the diaphragmatic hiatus. Multiple venous collaterals in the right side of the chest. Apparent stenosis of the origin of the right subclavian vein with collateral reconstitution at the  SVC. Evaluation of lungs is limited due to respiratory motion artifact. There are small bilateral pleural effusions with basilar atelectasis. Somewhat nodular focal consolidation in the left lung base is probably due to atelectasis but an underlying mass is not excluded. Follow-up after resolution of acute process is recommended. Diffuse interstitial pattern and diffuse mosaic appearance to the lungs suggests pulmonary edema. Airways are patent. CT ABDOMEN and PELVIS FINDINGS Large volume diffuse free fluid throughout the abdomen and pelvis. Appearance is consistent with ascites. The this is new since the previous study. The liver, spleen, gallbladder, pancreas, adrenal glands, inferior vena cava, and retroperitoneal lymph nodes are unremarkable. Prominent diffuse calcification of the abdominal aorta and branch vessel origins. There is likely calcific stenosis of the iliac arteries bilaterally with occlusion of the left internal iliac artery. No aneurysm. Stomach, small bowel, and colon are not abnormally distended. No free air in the abdomen. Diffuse edema throughout the  subcutaneous fat and soft tissues. Kidneys are markedly atrophic bilaterally with small cysts appreciated. No hydronephrosis. Pelvis: Bladder is decompressed and cannot be evaluated. Uterus is surgically absent. Appendix appears surgically absent. No pelvic mass or lymphadenopathy. No destructive bone lesions. Review of the MIP images confirms the above findings. IMPRESSION: Chest: No evidence of significant pulmonary embolus. Cardiac enlargement with pulmonary vascular congestion and diffuse pulmonary edema. Small bilateral pleural effusions. Atelectasis or consolidation in the lung bases. Focal nodular consolidation in the left lung base is probably due to atelectasis but underlying mass lesion is not excluded and followup after resolution of acute process is suggested. 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 Multiple right chest wall collaterals. Probable high-grade stenosis of the left subclavian artery. Abdomen: Large volume diffuse ascites throughout the abdomen and pelvis. This is new since previous study. Diffuse soft tissue edema. Prominent vascular calcifications. Electronically Signed   By: Lucienne Capers M.D.   On: 02/27/2016 21:05   Dg Chest Port 1 View  03/14/2016  CLINICAL DATA:  Worsening weakness. Diarrhea. Shortness of breath. Atrial fibrillation. COPD. Congestive heart failure. EXAM: PORTABLE CHEST 1 VIEW COMPARISON:  09/02/2015 FINDINGS: Cardiomegaly is again noted. Decreased lung volumes since prior study. Bilateral pleural- parenchymal scarring again seen, right side greater than left. No evidence of pulmonary consolidation or edema. No definite evidence of pleural effusion or pneumothorax. IMPRESSION: Stable cardiomegaly. Low lung volumes with no significant change in bilateral pleural-parenchymal scarring. No acute findings. Electronically Signed   By: Earle Gell M.D.   On: 02/25/2016 18:57       --Marda Stalker, MD.  Board  Certified in Internal Medicine, Pulmonary Medicine, [place], and Sleep Medicine.  [facility]  Patricia Pesa, M.D.  Vilinda Boehringer, M.D.  Merton Border, M.D   03/18/2016, 11:24 AM  [place].  I have personally obtained a history, examined the patient, evaluated laboratory and imaging results, formulated the assessment and plan and placed orders. The Patient requires high complexity decision making for assessment and support, frequent evaluation and titration of therapies, application of advanced monitoring technologies and extensive interpretation of multiple databases. The patient has critical illness that could lead imminently to failure of 1 or more organ systems and requires the highest level of physician preparedness to intervene.  Critical Care Time devoted to patient care services described in this note is 35 minutes and is exclusive of time spent in procedures.

## 2016-03-18 NOTE — ED Notes (Signed)
Pt unable to tolerate 3 L [place]. Pt moved to 4L [place].

## 2016-03-18 NOTE — ED Notes (Signed)
Consent signed for dialysis treatment.

## 2016-03-18 NOTE — Consult Note (Signed)
[facility]  CARDIOLOGY CONSULT NOTE  Patient ID: [name] MRN: [id number] DOB/AGE: November 27, 1941 75 y.o.  Admit date: 03/24/2016 Referring Physician Dr. Posey Pronto Primary Physician Dr. Lisette Grinder Primary Cardiologist Dr. Serafina Royals Reason for Consultation diastollic chf  HPI: Patient is a 75 year old female with history of persistent nonvalvular atrial fibrillation, history of end-stage renal disease on hemodialysis, history of echocardiogram done last year read as showing normal LV function with an EF of 50-55%. She does have evidence of pulmonary hypertension with an estimated PA systolic pressure of 51 mmHg. She had a calcified mitral valve annulus with mild MR. She was admitted with progressive shortness of breath. Chest x-ray did not appear to show any acute process however chest CT showed evidence of mild pulmonary edema. There was no evidence of pulmonary embolus. Placed on BiPAP with some improvement. Serum troponin is mildly elevated consistent with renal insufficiency. She states she has been compliant with medications as well as hemodialysis. Her serum creatinine is 4.79. Serum potassium is 4.0. Electrocardiogram showed what appears to be sinus tachycardia at a rate of 107. There is no evidence of ischemia. She has been treated with amlodipine at 10 mg daily, isosorbide mononitrate 60 mg daily, metoprolol metoprolol tartrate 25 mg twice daily as an outpatient. She is not anticoagulated. She appears to be at relatively high risks for bleeding from anticoagulation.  Review of Systems  Constitutional: Positive for malaise/fatigue.  HENT: Negative.   Eyes: Negative.   Respiratory: Positive for shortness of breath.   Cardiovascular: Positive for leg swelling.  Genitourinary: Negative.   Musculoskeletal: Negative.   Skin: Negative.   Neurological: Negative.   Endo/Heme/Allergies: Negative.   Psychiatric/Behavioral: Negative.     Past Medical  History  Diagnosis Date  . Hypertension   . Hyperlipidemia   . Thyroid disease     Hypothyroidism  . ESRD (end stage renal disease) on dialysis ([place])   . 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 COPD (chronic obstructive pulmonary disease) ([place])   . Diverticulitis 2013 sept.      hospitalized  at [place].  . Anginal pain ([place])   . Hypothyroidism   . Shortness of breath   . Pneumonia   . Chronic diastolic CHF (congestive heart failure) ([place])   . GERD (gastroesophageal reflux disease)   . Arthritis   . Asthma   . Cancer [facility])     Family History  Problem Relation Age of Onset  . Heart disease Mother   . Cancer Father   . Kidney disease Brother     Social History   Social History  . Marital Status: Married    Spouse Name: N/A  . Number of Children: N/A  . Years of Education: N/A   Occupational History  . Not on file.   Social History Main Topics  . Smoking status: Former Smoker -- 0.50 packs/day for 20 years    Types: Cigarettes    Quit date: 06/12/1998  . Smokeless tobacco: Never Used  . Alcohol Use: No  . Drug Use: No  . Sexual Activity: Not on file   Other Topics Concern  . Not on file   Social History Narrative    Past Surgical History  Procedure Laterality Date  . Av fistula placement, brachiocephalic  [id number]    right arm  . Abdominal hysterectomy    . Colonoscopy  Dec 2013    colon cancer .. surgery  to be 2014  . Shuntogram Left 05/28/2013  Procedure: SHUNTOGRAM;  Surgeon: Conrad Aquilla, MD;  Location: [facility] CATH LAB;  Service: Cardiovascular;  Laterality: Left;  . Peripheral vascular catheterization Left 06/13/2015    Procedure: A/V Shuntogram/Fistulagram;  Surgeon: Katha Cabal, MD;  Location: [facility] CV LAB;  Service: Cardiovascular;  Laterality: Left;  . Peripheral vascular catheterization Left 06/13/2015    Procedure: A/V Shunt Intervention;  Surgeon: Katha Cabal, MD;  Location: [place] CV LAB;  Service: Cardiovascular;   Laterality: Left;  . Colon surgery    . Peripheral vascular catheterization N/A 11/12/2015    Procedure: A/V Shuntogram/Fistulagram;  Surgeon: Katha Cabal, MD;  Location: [place] CV LAB;  Service: Cardiovascular;  Laterality: N/A;  . Peripheral vascular catheterization N/A 11/12/2015    Procedure: A/V Shunt Intervention;  Surgeon: Katha Cabal, MD;  Location: [place] CV LAB;  Service: Cardiovascular;  Laterality: N/A;     Prescriptions prior to admission  Medication Sig Dispense Refill Last Dose  . acetaminophen (TYLENOL) 500 MG tablet Take 1,000 mg by mouth every 4 (four) hours as needed for mild pain or headache.    03/16/2016 at 2100  . b complex-vitamin c-folic acid (NEPHRO-VITE) 0.8 MG TABS tablet Take 1 tablet by mouth at bedtime. 30 tablet 1 03/16/2016 at Unknown time  . budesonide-formoterol (SYMBICORT) 160-4.5 MCG/ACT inhaler Inhale 2 puffs into the lungs 2 (two) times daily.   03/16/2016 at Unknown time  . calcium acetate (PHOSLO) 667 MG capsule Take 667 mg by mouth 3 (three) times daily with meals.   03/16/2016 at Unknown time  . diphenhydrAMINE (BENADRYL) 25 mg capsule Take 25 mg by mouth 3 (three) times daily as needed for itching or sleep.    03/16/2016 at Unknown time  . Ipratropium-Albuterol (COMBIVENT RESPIMAT) 20-100 MCG/ACT AERS respimat Inhale 1 puff into the lungs every 6 (six) hours as needed for wheezing or shortness of breath.   03/16/2016 at Unknown time  . isosorbide mononitrate (IMDUR) 60 MG 24 hr tablet Take 60 mg by mouth daily.    03/16/2016 at Unknown time  . levothyroxine (SYNTHROID, LEVOTHROID) 125 MCG tablet Take 125 mcg by mouth daily before breakfast.    03/16/2016 at Unknown time  . metoprolol tartrate (LOPRESSOR) 25 MG tablet Take 25 mg by mouth 2 (two) times daily.   03/16/2016 at 2100  . ondansetron (ZOFRAN) 4 MG tablet Take 4 mg by mouth every 8 (eight) hours as needed for nausea or vomiting.   Past Week at Unknown time  . pantoprazole  (PROTONIX) 40 MG tablet Take 40 mg by mouth 2 (two) times daily.   03/16/2016 at Unknown time  . sevelamer (RENVELA) 800 MG tablet Take 800 mg by mouth 3 (three) times daily with meals.    03/16/2016 at Unknown time    Physical Exam: Blood pressure 92/66, pulse 108, temperature 97.8 F (36.6 C), temperature source Axillary, resp. rate 23, height 5\' 4"  (1.626 m), weight 72.8 kg (160 lb 7.9 oz), SpO2 98 %.   Wt Readings from Last 1 Encounters:  03/18/16 72.8 kg (160 lb 7.9 oz)     General appearance: cooperative Head: Normocephalic, without obvious abnormality, atraumatic Resp: rhonchi bibasilar Cardio: regular rate and rhythm GI: soft, non-tender; bowel sounds normal; no masses,  no organomegaly Extremities: edema 2+ edema bilaterally in lower extremities Neurologic: Grossly normal  Labs:   Lab Results  Component Value Date   WBC 4.7 03/07/2016   HGB 11.0* 03/18/2016   HCT 34.6* 03/16/2016   MCV 90.0  02/26/2016   PLT 163 03/24/2016    Recent Labs Lab 03/25/2016 1835  NA 131*  K 4.0  CL 96*  CO2 22  BUN 25*  CREATININE 4.79*  CALCIUM 7.5*  PROT 7.4  BILITOT 2.5*  ALKPHOS 141*  ALT 6*  AST 14*  GLUCOSE 65   Lab Results  Component Value Date   CKTOTAL 18* 10/08/2013   CKMB < 0.5* 10/08/2013   TROPONINI 0.21* 03/09/2016      Radiology: Chest x-ray showed evidence of cardiomegaly with no obvious pulmonary edema. Chest CT revealed no pulmonary embolus but suggest an mild pulmonary edema with small bilateral pleural effusions EKG: Atrial tachycardia with no ischemia  ASSESSMENT AND PLAN:  Patient with history of end-stage renal disease intermittent but often persistent atrial fibrillation as well as normal LV function admitted with worsening shortness of breath. She had evidence of very mild volume overload. He reported compliance with her medication. She required BiPAP for adequate oxygenation. She is currently on BiPAP and feeling somewhat better. She has no significant  lower extremity edema. She is scheduled for hemodialysis. She reports compliance with her medications and hemodialysis. She has mild serum troponin elevation which does not appear to represent acute coronary syndrome likely secondary to demand ischemia given volume overload, hypoxia the face of renal insufficiency. Would not proceed with invasive evaluation at this time. Patient's ejection fraction by echo done recently was preserved at 50-55%. She underwent ultrafiltration urgently with some improvement. Her normal dialysis days are Tuesday Thursday and Saturday.Burnis Medin continue to follow heart rate. She also has some evidence of ascites which will be further evaluated. We'll continue found serum troponin and exam.  Signed: Teodoro Spray MD, [facility] 03/18/2016, 8:25 AM

## 2016-03-18 NOTE — Care Management (Addendum)
Patient admitted to icu due to need for continuous bipap.  Admitted with pulmonary edema and has received a shortened emergent dialysis session.  She receives chronic dialysis at [facility] on [address].  She is current with her PCP and no issues accessing medical care, obtaining medications or transportation.  She has chronic home 02 though [facility].  Currently no services in the home.  Patient is too dyspneic to continue with CM assessment

## 2016-03-19 ENCOUNTER — Inpatient Hospital Stay: Payer: Medicare Other

## 2016-03-19 LAB — GLUCOSE, CAPILLARY: Glucose-Capillary: 132 mg/dL — ABNORMAL HIGH (ref 65–99)

## 2016-03-19 LAB — HEPATITIS B SURFACE ANTIGEN: Hepatitis B Surface Ag: NEGATIVE

## 2016-03-19 MED ORDER — NITROGLYCERIN IN D5W 200-5 MCG/ML-% IV SOLN
INTRAVENOUS | Status: AC
  Administered 2016-03-19: 10 ug/min via INTRAVENOUS
  Filled 2016-03-19: qty 250

## 2016-03-19 MED ORDER — AMIODARONE HCL IN DEXTROSE 360-4.14 MG/200ML-% IV SOLN
60.0000 mg/h | INTRAVENOUS | Status: AC
  Administered 2016-03-19: 60 mg/h via INTRAVENOUS
  Filled 2016-03-19: qty 200

## 2016-03-19 MED ORDER — AMIODARONE HCL IN DEXTROSE 360-4.14 MG/200ML-% IV SOLN: 30.0000 mg/h | INTRAVENOUS | Status: DC

## 2016-03-19 MED ORDER — AMIODARONE LOAD VIA INFUSION
150.0000 mg | Freq: Once | INTRAVENOUS | Status: AC
  Administered 2016-03-19: 150 mg via INTRAVENOUS
  Filled 2016-03-19: qty 83.34

## 2016-03-19 MED ORDER — NITROGLYCERIN IN D5W 200-5 MCG/ML-% IV SOLN
0.0000 ug/min | INTRAVENOUS | Status: DC
  Administered 2016-03-19: 10 ug/min via INTRAVENOUS

## 2016-03-22 LAB — CULTURE, BLOOD (ROUTINE X 2)
CULTURE: NO GROWTH
CULTURE: NO GROWTH

## 2016-03-27 NOTE — Discharge Summary (Signed)
  Date of admission 03/25/2016 Patient died on 04-13-2016 at 4:18 AM  Final diagnoses  1. Acute hypoxic respiratory failure secondary to pulmonary edema severe 2. Chronic respiratory failure with severe COPD 3. Diastolic congestive heart failure 4. Pulmonary hypertension 5. ESRD  Mrs. Boeder was a 75 year old African-American female with history of ESRD on hemodialysis came in with increasing shortness of breath and diarrhea for over last 1 month. Patient was diagnosed to have bilateral severe pulmonary edema was placed on BiPAP. She was a no code DO NOT RESUSCITATE. Patient underwent urgent hemodialysis with ultrafiltration. She continued to show climbing. She went into respiratory distress with heart rate in the 140s and 50s. Amiodarone and nitroglycerin drips were started. Patient became increasingly lethargic she became unresponsive. She went asystole at 4:18 AM. Patient was a DO NOT RESUSCITATE.  Patient was pronounced at 4:18 AM.

## 2016-03-27 NOTE — Progress Notes (Signed)
Pt went into respiratory distress. Heart rate went to the 140's, 150s.  [facility] was notified and amiodarone and nitroglycerin drips were started to control heart rate. Respiratory was also notified. Pt continued to have increased work of breathing.  Pt on 100% BiPap.  Lungs were coarse and rhoncus.  Duoneb was given.  Chest xray was done.  Pt became increasingly lethargic. Pt became unresponsive. During this time, I was at bedside. Pt went asystole at 4:18am.  [facility]link was notified, cardiac monitoring was notified.  Family was called. Dr. Marcille Blanco pronounced time of death.  Called and  spoke with daughter Marliss Coots.  [facility] was notified.  Belinda now at bedside.  Supervisor was notified.

## 2016-03-27 NOTE — Progress Notes (Signed)
At pt bedside when pt went somnolent, unresponsive then asystole. Pt was removed from Bipap and subsequently death was pronounced by Dr. Marcille Blanco. Several RN's at bedside.

## 2016-03-27 NOTE — Progress Notes (Signed)
eLink Physician-Brief Progress Note Patient Name: [name] DOB: 12/25/1941 MRN: [id number]   Date of Service  29-Mar-2016  HPI/Events of Note  Increased WOB. Bedside nurse states that the patient "sounds wet". HR = 123. AFIB. BP = 109/85. Patient is already on BiPAP and getting a Neb Rx. DNR. ESRD - makes no urine.  eICU Interventions  Will order: 1. Amiodarone IV infusion.  2. Nitroglycerin IV infusion. Start at 2.5 mcg/min titrate to SBP 95-105.  3. Get AM CXR now.      Intervention Category Intermediate Interventions: Respiratory distress - evaluation and management  Aarti Mankowski [place] 29-Mar-2016, 2:37 AM

## 2016-03-27 DEATH — deceased
